# Patient Record
Sex: Male | Born: 1940
Health system: Southern US, Community
[De-identification: ages and names within clinical notes are randomized; demographics above are authoritative.]

## PROBLEM LIST (undated history)

## (undated) DIAGNOSIS — I10 Essential (primary) hypertension: Secondary | ICD-10-CM

## (undated) DIAGNOSIS — K219 Gastro-esophageal reflux disease without esophagitis: Secondary | ICD-10-CM

## (undated) DIAGNOSIS — N419 Inflammatory disease of prostate, unspecified: Secondary | ICD-10-CM

## (undated) HISTORY — PX: PACEMAKER INSERTION: SHX728

---

## 2011-06-24 ENCOUNTER — Encounter (HOSPITAL_BASED_OUTPATIENT_CLINIC_OR_DEPARTMENT_OTHER): Payer: Self-pay | Admitting: Emergency Medicine

## 2011-06-24 ENCOUNTER — Emergency Department (HOSPITAL_BASED_OUTPATIENT_CLINIC_OR_DEPARTMENT_OTHER)
Admission: EM | Admit: 2011-06-24 | Discharge: 2011-06-24 | Disposition: A | Discharge status: Home / Self Care | Payer: No Typology Code available for payment source | Attending: Emergency Medicine | Admitting: Emergency Medicine

## 2011-06-24 DIAGNOSIS — M109 Gout, unspecified: Secondary | ICD-10-CM | POA: Insufficient documentation

## 2011-06-24 DIAGNOSIS — I10 Essential (primary) hypertension: Secondary | ICD-10-CM | POA: Insufficient documentation

## 2011-06-24 DIAGNOSIS — Z79899 Other long term (current) drug therapy: Secondary | ICD-10-CM | POA: Insufficient documentation

## 2011-06-24 DIAGNOSIS — K219 Gastro-esophageal reflux disease without esophagitis: Secondary | ICD-10-CM | POA: Insufficient documentation

## 2011-06-24 DIAGNOSIS — E119 Type 2 diabetes mellitus without complications: Secondary | ICD-10-CM | POA: Insufficient documentation

## 2011-06-24 HISTORY — DX: Inflammatory disease of prostate, unspecified: N41.9

## 2011-06-24 HISTORY — DX: Gastro-esophageal reflux disease without esophagitis: K21.9

## 2011-06-24 HISTORY — DX: Essential (primary) hypertension: I10

## 2011-06-24 MED ORDER — INDOMETHACIN 25 MG PO CAPS: 25.0000 mg | ORAL_CAPSULE | Freq: Three times a day (TID) | ORAL | Status: AC

## 2011-06-24 NOTE — Discharge Instructions (Signed)
Purine Restricted Diet  A low-purine diet consists of foods that reduce uric acid made in your body.  INDICATIONS FOR USE    Your caregiver may ask you to follow a low-purine diet to reduce gout flairs.    GUIDELINES    Avoid high-purine foods, including all alcohol, yeast extracts taken as supplements, and sauces made from meats (like gravy). Do not eat high-purine meats, including anchovies, sardines, herring, mussels, tuna, codfish, scallops, trout, haddock, bacon, organ meats, tripe, goose, wild game, and sweetbreads.    Grains   Allowed/Recommended: All, except those listed to consume in moderation.    Consume in Moderation: Oatmeal (? cup uncooked daily), wheat bran or germ ( cup daily), and whole grains.   Vegetables   Allowed/Recommended: All, except those listed to consume in moderation.    Consume in Moderation: Asparagus, cauliflower, spinach, mushrooms, and green peas ( cup daily).   Fruit   Allowed/Recommended: All.    Consume in Moderation: None.   Meat and Meat Substitutes   Allowed/Recommended: Eggs, nuts, and peanut butter.    Consume in Moderation: Limit to 4 to 6 oz daily. Avoid high-purine meats. Lentils, peas, and dried beans (1 cup daily).   Milk   Allowed/Recommended: All. Choose low-fat or skim when possible.    Consume in Moderation: None.   Fats and Oils   Allowed/Recommended: All.    Consume in Moderation: None.   Beverages   Allowed/Recommended: All, except those listed to avoid.    Avoid: All alcohol.   Condiments/Miscellaneous   Allowed/Recommended: All, except those listed to consume in moderation.    Consume in Moderation: Bouillon and meat-based broths and soups.   Document Released: 05/28/2010 Document Revised: 01/20/2011 Document Reviewed: 05/28/2010  [employer] Patient Information 2012 [employer]    Gout   Gout is an inflammatory condition (arthritis) caused by a buildup of uric acid crystals in the joints. Uric acid is a chemical that is normally present in the blood. Under some circumstances, uric acid can form into crystals in your joints. This causes joint redness, soreness, and swelling (inflammation). Repeat attacks are common. Over time, uric acid crystals can form into masses (tophi) near a joint, causing disfigurement. Gout is treatable and often preventable.  CAUSES    The disease begins with elevated levels of uric acid in the blood. Uric acid is produced by your body when it breaks down a naturally found substance called purines. This also happens when you eat certain foods such as meats and fish. Causes of an elevated uric acid level include:   Being passed down from parent to child (heredity).    Diseases that cause increased uric acid production (obesity, psoriasis, some cancers).    Excessive alcohol use.    Diet, especially diets rich in meat and seafood.    Medicines, including certain cancer-fighting drugs (chemotherapy), diuretics, and aspirin.    Chronic kidney disease. The kidneys are no longer able to remove uric acid well.    Problems with metabolism.   Conditions strongly associated with gout include:   Obesity.    High blood pressure.    High cholesterol.    Diabetes.   Not everyone with elevated uric acid levels gets gout. It is not understood why some people get gout and others do not. Surgery, joint injury, and eating too much of certain foods are some of the factors that can lead to gout.  SYMPTOMS     An attack of gout comes on quickly. It causes   intense pain with redness, swelling, and warmth in a joint.    Fever can occur.    Often, only one joint is involved. Certain joints are more commonly involved:    Base of the big toe.    Knee.    Ankle.    Wrist.    Finger.    Without treatment, an attack usually goes away in a few days to weeks. Between attacks, you usually will not have symptoms, which is different from many other forms of arthritis.  DIAGNOSIS    Your caregiver will suspect gout based on your symptoms and exam. Removal of fluid from the joint (arthrocentesis) is done to check for uric acid crystals. Your caregiver will give you a medicine that numbs the area (local anesthetic) and use a needle to remove joint fluid for exam. Gout is confirmed when uric acid crystals are seen in joint fluid, using a special microscope. Sometimes, blood, urine, and X-ray tests are also used.  TREATMENT    There are 2 phases to gout treatment: treating the sudden onset (acute) attack and preventing attacks (prophylaxis).  Treatment of an Acute Attack   Medicines are used. These include anti-inflammatory medicines or steroid medicines.    An injection of steroid medicine into the affected joint is sometimes necessary.    The painful joint is rested. Movement can worsen the arthritis.    You may use warm or cold treatments on painful joints, depending which works best for you.    Discuss the use of coffee, vitamin C, or cherries with your caregiver. These may be helpful treatment options.   Treatment to Prevent Attacks  After the acute attack subsides, your caregiver may advise prophylactic medicine. These medicines either help your kidneys eliminate uric acid from your body or decrease your uric acid production. You may need to stay on these medicines for a very long time.  The early phase of treatment with prophylactic medicine can be associated with an increase in acute gout attacks. For this reason, during the first few months of treatment, your caregiver may also advise you to take medicines usually used for acute gout treatment. Be sure you understand your caregiver's directions.   You should also discuss dietary treatment with your caregiver. Certain foods such as meats and fish can increase uric acid levels. Other foods such as dairy can decrease levels. Your caregiver can give you a list of foods to avoid.  HOME CARE INSTRUCTIONS     Do not take aspirin to relieve pain. This raises uric acid levels.    Only take over-the-counter or prescription medicines for pain, discomfort, or fever as directed by your caregiver.    Rest the joint as much as possible. When in bed, keep sheets and blankets off painful areas.    Keep the affected joint raised (elevated).    Use crutches if the painful joint is in your leg.    Drink enough water and fluids to keep your urine clear or pale yellow. This helps your body get rid of uric acid. Do not drink alcoholic beverages. They slow the passage of uric acid.    Follow your caregiver's dietary instructions. Pay careful attention to the amount of protein you eat. Your daily diet should emphasize fruits, vegetables, whole grains, and fat-free or low-fat milk products.    Maintain a healthy body weight.   SEEK MEDICAL CARE IF:     You have an oral temperature above 102 F (38.9 C).    You   develop diarrhea, vomiting, or any side effects from medicines.    You do not feel better in 24 hours, or you are getting worse.   SEEK IMMEDIATE MEDICAL CARE IF:     Your joint becomes suddenly more tender and you have:    Chills.    An oral temperature above 102 F (38.9 C), not controlled by medicine.   MAKE SURE YOU:     Understand these instructions.    Will watch your condition.    Will get help right away if you are not doing well or get worse.   Document Released: 01/29/2000 Document Revised: 01/20/2011 Document Reviewed: 05/11/2009  [employer] Patient Information 2012 [employer].

## 2011-06-24 NOTE — ED Provider Notes (Signed)
History     CSN: [id number]  Arrival date & time 06/24/11  0227   First MD Initiated Contact with Patient 06/24/11 0245      Chief Complaint  Patient presents with  . Foot Pain    (Consider location/radiation/quality/duration/timing/severity/associated sxs/prior treatment) Patient is a 71 y.o. male presenting with lower extremity pain. The history is provided by the patient.  Foot Pain This is a recurrent problem. The current episode started yesterday. The problem occurs constantly. The problem has not changed since onset.Pertinent negatives include no chest pain, no abdominal pain, no headaches and no shortness of breath. The symptoms are aggravated by nothing. The symptoms are relieved by nothing. He has tried nothing for the symptoms. The treatment provided no relief.  H/o of gout with right great toe pain and swelling.  Was afraid to take his meds so he came in for a check.    Past Medical History  Diagnosis Date  . Gout   . GERD (gastroesophageal reflux disease)   . Diabetes mellitus   . Hypertension   . Prostatitis     Past Surgical History  Procedure Date  . Pacemaker insertion     No family history on file.  History  Substance Use Topics  . Smoking status: Never Smoker   . Smokeless tobacco: Not on file  . Alcohol Use: Yes      Review of Systems  Respiratory: Negative for shortness of breath.   Cardiovascular: Negative for chest pain.  Gastrointestinal: Negative for abdominal pain.  Neurological: Negative for headaches.  All other systems reviewed and are negative.    Allergies  Vicodin  Home Medications   Current Outpatient Rx  Name Route Sig Dispense Refill  . ASPIRIN 81 MG PO TABS Oral Take 81 mg by mouth daily.    Marland Kitchen DILTIAZEM HCL ER 240 MG PO CP24 Oral Take 240 mg by mouth daily.    . GUAIFENESIN ER 600 MG PO TB12 Oral Take 1,200 mg by mouth 2 (two) times daily.    . INDOMETHACIN 50 MG PO CAPS Oral Take 50 mg by mouth 3 (three) times daily  with meals.    Marland Kitchen LEVOFLOXACIN 500 MG PO TABS Oral Take 500 mg by mouth daily.    Marland Kitchen LISINOPRIL-HYDROCHLOROTHIAZIDE 20-25 MG PO TABS Oral Take 1 tablet by mouth daily.    Marland Kitchen OMEPRAZOLE 20 MG PO CPDR Oral Take 20 mg by mouth daily.    Marland Kitchen SIMVASTATIN 20 MG PO TABS Oral Take 20 mg by mouth every evening.    . INDOMETHACIN 25 MG PO CAPS Oral Take 1 capsule (25 mg total) by mouth 3 (three) times daily with meals. 20 capsule 0    BP 123/77  Pulse 95  Temp(Src) 97.8 F (36.6 C) (Oral)  Resp 18  Ht 5\' 8"  (1.727 m)  Wt 262 lb (118.842 kg)  BMI 39.84 kg/m2  SpO2 100%  Physical Exam  Constitutional: He is oriented to person, place, and time. He appears well-developed and well-nourished. No distress.  HENT:  Head: Normocephalic and atraumatic.  Eyes: Conjunctivae are normal. Pupils are equal, round, and reactive to light.  Neck: Normal range of motion. Neck supple.  Cardiovascular: Normal rate and regular rhythm.   Pulmonary/Chest: Effort normal and breath sounds normal.  Abdominal: Soft. Bowel sounds are normal.  Musculoskeletal: Normal range of motion.       Swelling of right great toe without erythema, cw gout  Neurological: He is alert and oriented to person, place, and  time.  Skin: Skin is warm and dry.  Psychiatric: He has a normal mood and affect.    ED Course  Procedures (including critical care time)  Labs Reviewed - No data to display No results found.   1. Gout       MDM  Take indomethacin with food and follow up with your family doctor      Shene Maxfield K Starnisha Batrez-Rasch, MD 06/24/11 [phone]

## 2011-06-24 NOTE — ED Notes (Signed)
Pt c/o pain in right foot starting last night. Pt has hx of gout, denies known injury.

## 2011-06-24 NOTE — ED Notes (Signed)
MD at bedside. 

## 2012-09-18 ENCOUNTER — Emergency Department (HOSPITAL_BASED_OUTPATIENT_CLINIC_OR_DEPARTMENT_OTHER): Payer: Medicare HMO

## 2012-09-18 ENCOUNTER — Encounter (HOSPITAL_BASED_OUTPATIENT_CLINIC_OR_DEPARTMENT_OTHER): Payer: Self-pay | Admitting: Emergency Medicine

## 2012-09-18 ENCOUNTER — Emergency Department (HOSPITAL_BASED_OUTPATIENT_CLINIC_OR_DEPARTMENT_OTHER)
Admission: EM | Admit: 2012-09-18 | Discharge: 2012-09-18 | Disposition: A | Discharge status: Home / Self Care | Payer: Medicare HMO | Attending: Emergency Medicine | Admitting: Emergency Medicine

## 2012-09-18 ENCOUNTER — Other Ambulatory Visit: Payer: Self-pay

## 2012-09-18 DIAGNOSIS — R059 Cough, unspecified: Secondary | ICD-10-CM | POA: Insufficient documentation

## 2012-09-18 DIAGNOSIS — F458 Other somatoform disorders: Secondary | ICD-10-CM | POA: Insufficient documentation

## 2012-09-18 DIAGNOSIS — E119 Type 2 diabetes mellitus without complications: Secondary | ICD-10-CM | POA: Insufficient documentation

## 2012-09-18 DIAGNOSIS — R05 Cough: Secondary | ICD-10-CM

## 2012-09-18 DIAGNOSIS — R0989 Other specified symptoms and signs involving the circulatory and respiratory systems: Secondary | ICD-10-CM

## 2012-09-18 DIAGNOSIS — R141 Gas pain: Secondary | ICD-10-CM | POA: Insufficient documentation

## 2012-09-18 DIAGNOSIS — M109 Gout, unspecified: Secondary | ICD-10-CM | POA: Insufficient documentation

## 2012-09-18 DIAGNOSIS — R220 Localized swelling, mass and lump, head: Secondary | ICD-10-CM | POA: Insufficient documentation

## 2012-09-18 DIAGNOSIS — R143 Flatulence: Secondary | ICD-10-CM | POA: Insufficient documentation

## 2012-09-18 DIAGNOSIS — Z79899 Other long term (current) drug therapy: Secondary | ICD-10-CM | POA: Insufficient documentation

## 2012-09-18 DIAGNOSIS — Z7982 Long term (current) use of aspirin: Secondary | ICD-10-CM | POA: Insufficient documentation

## 2012-09-18 DIAGNOSIS — K219 Gastro-esophageal reflux disease without esophagitis: Secondary | ICD-10-CM | POA: Insufficient documentation

## 2012-09-18 DIAGNOSIS — N419 Inflammatory disease of prostate, unspecified: Secondary | ICD-10-CM | POA: Insufficient documentation

## 2012-09-18 DIAGNOSIS — Z791 Long term (current) use of non-steroidal anti-inflammatories (NSAID): Secondary | ICD-10-CM | POA: Insufficient documentation

## 2012-09-18 DIAGNOSIS — Z792 Long term (current) use of antibiotics: Secondary | ICD-10-CM | POA: Insufficient documentation

## 2012-09-18 DIAGNOSIS — R142 Eructation: Secondary | ICD-10-CM | POA: Insufficient documentation

## 2012-09-18 DIAGNOSIS — J3489 Other specified disorders of nose and nasal sinuses: Secondary | ICD-10-CM | POA: Insufficient documentation

## 2012-09-18 DIAGNOSIS — I10 Essential (primary) hypertension: Secondary | ICD-10-CM | POA: Insufficient documentation

## 2012-09-18 LAB — CBC WITH DIFFERENTIAL/PLATELET
Basophils Absolute: 0 10*3/uL (ref 0.0–0.1)
Basophils Relative: 0 % (ref 0–1)
MCHC: 33.9 g/dL (ref 30.0–36.0)
Neutro Abs: 5.1 10*3/uL (ref 1.7–7.7)
Neutrophils Relative %: 55 % (ref 43–77)
RDW: 12.5 % (ref 11.5–15.5)

## 2012-09-18 LAB — BASIC METABOLIC PANEL
GFR calc Af Amer: 62 mL/min — ABNORMAL LOW (ref >90)
GFR calc non Af Amer: 54 mL/min — ABNORMAL LOW (ref >90)
Potassium: 3.8 mEq/L (ref 3.5–5.1)
Sodium: 138 mEq/L (ref 135–145)

## 2012-09-18 MED ORDER — BENZONATATE 100 MG PO CAPS: 200.0000 mg | ORAL_CAPSULE | Freq: Three times a day (TID) | ORAL | Status: DC | PRN

## 2012-09-18 MED ORDER — OMEPRAZOLE 20 MG PO CPDR: 20.0000 mg | DELAYED_RELEASE_CAPSULE | Freq: Two times a day (BID) | ORAL | Status: DC

## 2012-09-18 MED ORDER — GI COCKTAIL ~~LOC~~
30.0000 mL | Freq: Once | ORAL | Status: AC
  Administered 2012-09-18: 30 mL via ORAL
  Filled 2012-09-18: qty 30

## 2012-09-18 MED ORDER — OXYMETAZOLINE HCL 0.05 % NA SOLN
1.0000 | Freq: Once | NASAL | Status: AC
  Administered 2012-09-18: 1 via NASAL
  Filled 2012-09-18: qty 15

## 2012-09-18 NOTE — ED Notes (Signed)
Pt reports cough producing white sputum x several days. Pt states tonight feels like his throat is closing up and difficulty to swallow. Pt in NAD

## 2012-09-18 NOTE — ED Notes (Signed)
MD at bedside. 

## 2012-09-18 NOTE — ED Provider Notes (Signed)
CSN: [id number]     Arrival date & time 09/18/12  0221 History     First MD Initiated Contact with Patient 09/18/12 0248     Chief Complaint  Patient presents with  . Cough  . Oral Swelling   HPI Derek Curry is a 72 y.o. male presenting to the emergency department with a cough productive of small amounts of white sputum, he also has a sensation of fullness or indigestion and that he constantly has to belch, these symptoms have been going on for 3 days. He also complains of increased nasal drainage. Denies any nasal congestion, ear pain or sinus pain, denies fevers, chills, chest pain, shortness of breath, hemoptysis, history of venous thromboembolic disease, recent immobilization, recent fracture, history of cancer treatments. Patient is a pertinent history of reflux disease, hypertension and diabetes.   Past Medical History  Diagnosis Date  . Gout   . GERD (gastroesophageal reflux disease)   . Diabetes mellitus   . Hypertension   . Prostatitis    Past Surgical History  Procedure Laterality Date  . Pacemaker insertion     No family history on file. History  Substance Use Topics  . Smoking status: Never Smoker   . Smokeless tobacco: Not on file  . Alcohol Use: Yes    Review of Systems At least 10pt or greater review of systems completed and are negative except where specified in the HPI.  Allergies  Vicodin  Home Medications   Current Outpatient Rx  Name  Route  Sig  Dispense  Refill  . celecoxib (CELEBREX) 50 MG capsule   Oral   Take 50 mg by mouth 2 (two) times daily.         Marland Kitchen diltiazem (DILACOR XR) 240 MG 24 hr capsule   Oral   Take 240 mg by mouth daily.         Marland Kitchen guaiFENesin (MUCINEX) 600 MG 12 hr tablet   Oral   Take 1,200 mg by mouth 2 (two) times daily.         . lansoprazole (PREVACID) 30 MG capsule   Oral   Take 30 mg by mouth daily.         Marland Kitchen lisinopril-hydrochlorothiazide (PRINZIDE,ZESTORETIC) 20-25 MG per tablet   Oral   Take 1  tablet by mouth daily.         . simvastatin (ZOCOR) 20 MG tablet   Oral   Take 20 mg by mouth every evening.         Marland Kitchen aspirin 81 MG tablet   Oral   Take 81 mg by mouth daily.         . benzonatate (TESSALON) 100 MG capsule   Oral   Take 2 capsules (200 mg total) by mouth 3 (three) times daily as needed for cough.   21 capsule   0   . indomethacin (INDOCIN) 50 MG capsule   Oral   Take 50 mg by mouth 3 (three) times daily with meals.         Marland Kitchen levofloxacin (LEVAQUIN) 500 MG tablet   Oral   Take 500 mg by mouth daily.         Marland Kitchen omeprazole (PRILOSEC) 20 MG capsule   Oral   Take 1 capsule (20 mg total) by mouth 2 (two) times daily.   30 capsule   0    BP 120/81  Pulse 60  Temp(Src) 97.7 F (36.5 C) (Oral)  Resp 18  SpO2 100% Physical Exam  Nursing notes reviewed.  Electronic medical record reviewed. VITAL SIGNS:   Filed Vitals:   09/18/12 0229 09/18/12 0440  BP: 129/75 120/81  Pulse: 78 60  Temp: 97.7 F (36.5 C)   TempSrc: Oral   Resp: 18 18  SpO2: 99% 100%   CONSTITUTIONAL: Awake, oriented, appears non-toxic HENT: Atraumatic, normocephalic, oral mucosa pink and moist, airway patent. Mild pharyngeal erythema without exudates or swelling. Nares patent without drainage. External ears normal. EYES: Conjunctiva clear, EOMI, PERRLA NECK: Trachea midline, non-tender, supple CARDIOVASCULAR: Normal heart rate, Normal rhythm, No murmurs, rubs, gallops PULMONARY/CHEST: Clear to auscultation, no rhonchi, wheezes, or rales. Symmetrical breath sounds. Non-tender. Pacemaker ABDOMINAL: Non-distended, obese, soft, non-tender - no rebound or guarding.  BS normal. NEUROLOGIC: Non-focal, moving all four extremities, no gross sensory or motor deficits. EXTREMITIES: No clubbing, cyanosis, or edema SKIN: Warm, Dry, No erythema, No rash  ED Course   Procedures (including critical care time)  Date: 09/18/2012  Rate: 79  Rhythm: sinus rhythm with PACs  QRS Axis:  Left  Intervals: normal  ST/T Wave abnormalities: normal  Conduction Disutrbances: none  Narrative Interpretation: Left axis deviation, nonspecific intraventricular block, sinus rhythm with PACs, no ST or T wave abnormalities suggestive of acute ischemia or infarction, no prior  Labs Reviewed  BASIC METABOLIC PANEL - Abnormal; Notable for the following:    Glucose, Bld 157 (*)    GFR calc non Af Amer 54 (*)    GFR calc Af Amer 62 (*)    All other components within normal limits  CBC WITH DIFFERENTIAL  TROPONIN I   Dg Chest 2 View  09/18/2012   *RADIOLOGY REPORT*  Clinical Data: Cough and congestion.  CHEST - 2 VIEW  Comparison: None.  Findings: Lungs are clear.  Heart size is normal.  No pneumothorax or pleural effusion.  IMPRESSION: No acute disease.   Original Report Authenticated By: Holley Dexter, M.D.   1. Cough   2. Globus sensation   3. Belching   4. Nasal drainage     MDM  Derek Curry is a 72 y.o. male presenting with globus sensation, cough with some sputum, overall presentation is consistent with GERD exacerbation, however with globus indigestion, history of hypertension, age, history of diabetes, wanted to ensure this is not an anginal equivalent presenting atypically in a diabetic older male, EKG is unremarkable for ST changes, nonischemic. Troponin is negative, versus patient's workup is unremarkable, vital signs are stable, within normal limits, is afebrile, nontoxic. Patient is feeling better after GI cocktail as well as Tessalon. Will increase patient's Prilosec concern for worsening GERD, have patient followup in one week with primary care physician. Patient to return to the ER for worsening symptoms, any chest pain, shortness of breath.  Patient is discharged home stable and in good condition, ventilator the patient's questions to his and his wife's satisfaction.   Jones Skene, MD 09/18/12 [phone]

## 2012-09-18 NOTE — ED Notes (Signed)
Patient transported to X-ray 

## 2013-05-14 ENCOUNTER — Encounter (HOSPITAL_BASED_OUTPATIENT_CLINIC_OR_DEPARTMENT_OTHER): Payer: Self-pay | Admitting: Emergency Medicine

## 2013-05-14 ENCOUNTER — Emergency Department (HOSPITAL_BASED_OUTPATIENT_CLINIC_OR_DEPARTMENT_OTHER)
Admission: EM | Admit: 2013-05-14 | Discharge: 2013-05-15 | Disposition: A | Discharge status: Home / Self Care | Payer: Medicare HMO | Attending: Emergency Medicine | Admitting: Emergency Medicine

## 2013-05-14 DIAGNOSIS — Z7982 Long term (current) use of aspirin: Secondary | ICD-10-CM | POA: Insufficient documentation

## 2013-05-14 DIAGNOSIS — N289 Disorder of kidney and ureter, unspecified: Secondary | ICD-10-CM | POA: Insufficient documentation

## 2013-05-14 DIAGNOSIS — R0602 Shortness of breath: Secondary | ICD-10-CM | POA: Insufficient documentation

## 2013-05-14 DIAGNOSIS — H18419 Arcus senilis, unspecified eye: Secondary | ICD-10-CM | POA: Insufficient documentation

## 2013-05-14 DIAGNOSIS — J029 Acute pharyngitis, unspecified: Secondary | ICD-10-CM | POA: Insufficient documentation

## 2013-05-14 DIAGNOSIS — M109 Gout, unspecified: Secondary | ICD-10-CM | POA: Insufficient documentation

## 2013-05-14 DIAGNOSIS — Z79899 Other long term (current) drug therapy: Secondary | ICD-10-CM | POA: Insufficient documentation

## 2013-05-14 DIAGNOSIS — E119 Type 2 diabetes mellitus without complications: Secondary | ICD-10-CM | POA: Insufficient documentation

## 2013-05-14 DIAGNOSIS — K219 Gastro-esophageal reflux disease without esophagitis: Secondary | ICD-10-CM | POA: Insufficient documentation

## 2013-05-14 DIAGNOSIS — R0982 Postnasal drip: Secondary | ICD-10-CM

## 2013-05-14 DIAGNOSIS — Z87448 Personal history of other diseases of urinary system: Secondary | ICD-10-CM | POA: Insufficient documentation

## 2013-05-14 DIAGNOSIS — I10 Essential (primary) hypertension: Secondary | ICD-10-CM | POA: Insufficient documentation

## 2013-05-14 NOTE — ED Notes (Signed)
Pt. Reports he had been "spitting up heavy mucus" white in color.  Pt. Also reports belching.  No reports of abd. Pain or diarrhea.

## 2013-05-14 NOTE — ED Provider Notes (Addendum)
CSN: [id number]     Arrival date & time 05/14/13  2329 History  This chart was scribed for Derek Fines, MD by Zettie Pho, ED Scribe. This patient was seen in room MH08/MH08 and the patient's care was started at 11:56 PM.    Chief Complaint  Patient presents with  . Cough   The history is provided by the patient. No language interpreter was used.   HPI Comments: Derek Curry is a 73 y.o. male with a history of GERD who presents to the Emergency Department complaining of a productive cough with white-colored sputum with associated postnasal drip, rhinorrhea, and sore throat onset last night. He reports some associated, intermittent shortness of breath. Patient was recently admitted to and discharged from the hospital for cellulitis to the right, lower leg and is currently taking Bactrim. He denies nausea, diarrhea, chest pain. Patient also has a history of DM, HTN, and prostatitis.   Past Medical History  Diagnosis Date  . Gout   . GERD (gastroesophageal reflux disease)   . Diabetes mellitus   . Hypertension   . Prostatitis    Past Surgical History  Procedure Laterality Date  . Pacemaker insertion     No family history on file. History  Substance Use Topics  . Smoking status: Never Smoker   . Smokeless tobacco: Not on file  . Alcohol Use: No    Review of Systems  A complete 10 system review of systems was obtained and all systems are negative except as noted in the HPI and PMH.    Allergies  Vicodin  Home Medications   Current Outpatient Rx  Name  Route  Sig  Dispense  Refill  . aspirin 81 MG tablet   Oral   Take 81 mg by mouth daily.         . benzonatate (TESSALON) 100 MG capsule   Oral   Take 2 capsules (200 mg total) by mouth 3 (three) times daily as needed for cough.   21 capsule   0   . celecoxib (CELEBREX) 50 MG capsule   Oral   Take 50 mg by mouth 2 (two) times daily.         Marland Kitchen diltiazem (DILACOR XR) 240 MG 24 hr capsule   Oral   Take 240 mg  by mouth daily.         Marland Kitchen guaiFENesin (MUCINEX) 600 MG 12 hr tablet   Oral   Take 1,200 mg by mouth 2 (two) times daily.         . indomethacin (INDOCIN) 50 MG capsule   Oral   Take 50 mg by mouth 3 (three) times daily with meals.         . lansoprazole (PREVACID) 30 MG capsule   Oral   Take 30 mg by mouth daily.         Marland Kitchen levofloxacin (LEVAQUIN) 500 MG tablet   Oral   Take 500 mg by mouth daily.         Marland Kitchen lisinopril-hydrochlorothiazide (PRINZIDE,ZESTORETIC) 20-25 MG per tablet   Oral   Take 1 tablet by mouth daily.         Marland Kitchen omeprazole (PRILOSEC) 20 MG capsule   Oral   Take 1 capsule (20 mg total) by mouth 2 (two) times daily.   30 capsule   0   . simvastatin (ZOCOR) 20 MG tablet   Oral   Take 20 mg by mouth every evening.  Triage Vitals: BP 132/79  Pulse 92  Temp(Src) 98.3 F (36.8 C) (Oral)  Resp 20  SpO2 100%  Physical Exam  Nursing note and vitals reviewed. Constitutional: He is oriented to person, place, and time. He appears well-developed and well-nourished. No distress.  HENT:  Head: Normocephalic and atraumatic.  Mouth/Throat: Oropharynx is clear and moist. No oropharyngeal exudate.  Eyes: Conjunctivae and EOM are normal. Pupils are equal, round, and reactive to light.  Arcus senilis bilaterally.   Neck: Normal range of motion. Neck supple.  Cardiovascular: Normal rate, regular rhythm, normal heart sounds and intact distal pulses.   Pulses:      Dorsalis pedis pulses are 2+ on the right side, and 2+ on the left side.  Pulmonary/Chest: Effort normal and breath sounds normal. No respiratory distress.  Abdominal: Soft. He exhibits no distension. There is no tenderness.  Hyperactive bowel sounds.   Musculoskeletal: Normal range of motion. He exhibits edema.  Chronic appearing edema of the lower legs.   Neurological: He is alert and oriented to person, place, and time.  Skin: Skin is warm and dry.  Psychiatric: He has a normal mood  and affect. His behavior is normal.    ED Course  Procedures (including critical care time)  DIAGNOSTIC STUDIES: Oxygen Saturation is 100% on room air, normal by my interpretation.    COORDINATION OF CARE: 12:01 AM- Discussed treatment plan with patient at bedside and patient verbalized agreement.   12:05 AM It is unclear from the patient's description if he is coughing up frothy mucus or if he is belching or vomiting upper frothy mucus. He does not describe frank emesis.  MDM   Nursing notes and vitals signs, including pulse oximetry, reviewed.  Summary of this visit's results, reviewed by myself:  Labs:  Results for orders placed during the hospital encounter of 05/14/13 (from the past 24 hour(s))  CBC WITH DIFFERENTIAL     Status: Abnormal   Collection Time    05/15/13 12:10 AM      Result Value Ref Range   WBC 10.3  4.0 - 10.5 K/uL   RBC 3.65 (*) 4.22 - 5.81 MIL/uL   Hemoglobin 11.7 (*) 13.0 - 17.0 g/dL   HCT 34.4 (*) 39.0 - 52.0 %   MCV 94.2  78.0 - 100.0 fL   MCH 32.1  26.0 - 34.0 pg   MCHC 34.0  30.0 - 36.0 g/dL   RDW 12.9  11.5 - 15.5 %   Platelets 410 (*) 150 - 400 K/uL   Neutrophils Relative % 67  43 - 77 %   Neutro Abs 6.8  1.7 - 7.7 K/uL   Lymphocytes Relative 25  12 - 46 %   Lymphs Abs 2.5  0.7 - 4.0 K/uL   Monocytes Relative 8  3 - 12 %   Monocytes Absolute 0.8  0.1 - 1.0 K/uL   Eosinophils Relative 0  0 - 5 %   Eosinophils Absolute 0.0  0.0 - 0.7 K/uL   Basophils Relative 0  0 - 1 %   Basophils Absolute 0.0  0.0 - 0.1 K/uL  BASIC METABOLIC PANEL     Status: Abnormal   Collection Time    05/15/13 12:10 AM      Result Value Ref Range   Sodium 134 (*) 137 - 147 mEq/L   Potassium 4.6  3.7 - 5.3 mEq/L   Chloride 93 (*) 96 - 112 mEq/L   CO2 23  19 - 32 mEq/L  Glucose, Bld 124 (*) 70 - 99 mg/dL   BUN 32 (*) 6 - 23 mg/dL   Creatinine, Ser 2.90 (*) 0.50 - 1.35 mg/dL   Calcium 11.0 (*) 8.4 - 10.5 mg/dL   GFR calc non Af Amer 20 (*) >90 mL/min   GFR  calc Af Amer 24 (*) >90 mL/min  PRO B NATRIURETIC PEPTIDE     Status: None   Collection Time    05/15/13 12:10 AM      Result Value Ref Range   Pro B Natriuretic peptide (BNP) 100.4  0 - 125 pg/mL  TROPONIN I     Status: None   Collection Time    05/15/13 12:10 AM      Result Value Ref Range   Troponin I <0.30  <0.30 ng/mL    Imaging Studies: Dg Chest 2 View  05/15/2013   CLINICAL DATA:  Cough  EXAM: CHEST  2 VIEW  COMPARISON:  09/18/2012  FINDINGS: Unchanged orientation of dual-chamber left approach pacer leads. No cardiomegaly. Unchanged mediastinal contours. No edema, consolidation, effusion, or pneumothorax.  IMPRESSION: No active cardiopulmonary disease.   Electronically Signed   By: Jorje Guild M.D.   On: 05/15/2013 00:58   3:02 AM The patient's cellulitis appears to completely resolved. The patient's BUN and creatinine were noted to be 20 and 1.3 on 09/18/2012. Records from [facility] ED on March 17 of this year indicate his BUN and creatinine were 21 and 1.82. This morning his BUN and creatinine are 32 and 2.9. This represents a fairly abrupt onset of renal insufficiency. This is a known consequence of Bactrim therapy. We will discontinue his Bactrim. This was discussed with his primary care physician, Dr. Jefm Petty, who would like to see him in his office in 48 hours.  The patient's cough appears to be triggered by postnasal drip and we will provide oxymetazoline nasal spray for symptomatic relief.   I personally performed the services described in this documentation, which was scribed in my presence. The recorded information has been reviewed and is accurate.     Derek Fines, MD 05/15/13 0304  Derek Fines, MD 05/15/13 [phone]

## 2013-05-15 ENCOUNTER — Emergency Department (HOSPITAL_BASED_OUTPATIENT_CLINIC_OR_DEPARTMENT_OTHER): Payer: Medicare HMO

## 2013-05-15 LAB — BASIC METABOLIC PANEL
BUN: 32 mg/dL — ABNORMAL HIGH (ref 6–23)
CALCIUM: 11 mg/dL — AB (ref 8.4–10.5)
CO2: 23 meq/L (ref 19–32)
CREATININE: 2.9 mg/dL — AB (ref 0.50–1.35)
Chloride: 93 mEq/L — ABNORMAL LOW (ref 96–112)
GFR calc Af Amer: 24 mL/min — ABNORMAL LOW (ref >90)
GFR, EST NON AFRICAN AMERICAN: 20 mL/min — AB (ref >90)
GLUCOSE: 124 mg/dL — AB (ref 70–99)
Potassium: 4.6 mEq/L (ref 3.7–5.3)
Sodium: 134 mEq/L — ABNORMAL LOW (ref 137–147)

## 2013-05-15 LAB — CBC WITH DIFFERENTIAL/PLATELET
BASOS ABS: 0 10*3/uL (ref 0.0–0.1)
Basophils Relative: 0 % (ref 0–1)
EOS ABS: 0 10*3/uL (ref 0.0–0.7)
EOS PCT: 0 % (ref 0–5)
HEMATOCRIT: 34.4 % — AB (ref 39.0–52.0)
Hemoglobin: 11.7 g/dL — ABNORMAL LOW (ref 13.0–17.0)
LYMPHS ABS: 2.5 10*3/uL (ref 0.7–4.0)
LYMPHS PCT: 25 % (ref 12–46)
MCH: 32.1 pg (ref 26.0–34.0)
MCHC: 34 g/dL (ref 30.0–36.0)
MCV: 94.2 fL (ref 78.0–100.0)
MONO ABS: 0.8 10*3/uL (ref 0.1–1.0)
Monocytes Relative: 8 % (ref 3–12)
Neutro Abs: 6.8 10*3/uL (ref 1.7–7.7)
Neutrophils Relative %: 67 % (ref 43–77)
PLATELETS: 410 10*3/uL — AB (ref 150–400)
RBC: 3.65 MIL/uL — ABNORMAL LOW (ref 4.22–5.81)
RDW: 12.9 % (ref 11.5–15.5)
WBC: 10.3 10*3/uL (ref 4.0–10.5)

## 2013-05-15 LAB — PRO B NATRIURETIC PEPTIDE: Pro B Natriuretic peptide (BNP): 100.4 pg/mL (ref 0–125)

## 2013-05-15 LAB — TROPONIN I: Troponin I: <0.3 ng/mL (ref <0.30)

## 2013-05-15 MED ORDER — OXYMETAZOLINE HCL 0.05 % NA SOLN
NASAL | Status: AC
  Administered 2013-05-15: 03:00:00
  Filled 2013-05-15: qty 15

## 2013-05-15 MED ORDER — FAMOTIDINE IN NACL 20-0.9 MG/50ML-% IV SOLN
20.0000 mg | Freq: Once | INTRAVENOUS | Status: AC
  Administered 2013-05-15: 20 mg via INTRAVENOUS
  Filled 2013-05-15: qty 50

## 2013-05-15 MED ORDER — SUCRALFATE 1 G PO TABS
1.0000 g | ORAL_TABLET | Freq: Once | ORAL | Status: AC
  Administered 2013-05-15: 1 g via ORAL
  Filled 2013-05-15: qty 1

## 2013-05-15 NOTE — Discharge Instructions (Signed)
You need to stop taking the Bactrim (trimethoprim/sulfamethoxazole) immediately as it may be damaging her kidneys. Dr. Jeralene Huff wants to see you in his office on Friday. Call his office this morning and ask for [facility].

## 2014-03-12 DIAGNOSIS — G4733 Obstructive sleep apnea (adult) (pediatric): Secondary | ICD-10-CM | POA: Insufficient documentation

## 2014-03-12 DIAGNOSIS — E785 Hyperlipidemia, unspecified: Secondary | ICD-10-CM | POA: Insufficient documentation

## 2014-05-14 DIAGNOSIS — Z96652 Presence of left artificial knee joint: Secondary | ICD-10-CM | POA: Insufficient documentation

## 2015-03-10 DIAGNOSIS — I480 Paroxysmal atrial fibrillation: Secondary | ICD-10-CM | POA: Insufficient documentation

## 2015-03-10 DIAGNOSIS — K219 Gastro-esophageal reflux disease without esophagitis: Secondary | ICD-10-CM | POA: Insufficient documentation

## 2015-03-10 DIAGNOSIS — I10 Essential (primary) hypertension: Secondary | ICD-10-CM | POA: Insufficient documentation

## 2015-04-02 DIAGNOSIS — E119 Type 2 diabetes mellitus without complications: Secondary | ICD-10-CM | POA: Insufficient documentation

## 2015-04-22 DIAGNOSIS — Z8679 Personal history of other diseases of the circulatory system: Secondary | ICD-10-CM | POA: Insufficient documentation

## 2015-05-04 DIAGNOSIS — Z45018 Encounter for adjustment and management of other part of cardiac pacemaker: Secondary | ICD-10-CM | POA: Insufficient documentation

## 2015-05-04 DIAGNOSIS — I495 Sick sinus syndrome: Secondary | ICD-10-CM | POA: Insufficient documentation

## 2016-01-04 DIAGNOSIS — J302 Other seasonal allergic rhinitis: Secondary | ICD-10-CM | POA: Insufficient documentation

## 2016-01-04 DIAGNOSIS — M1A0720 Idiopathic chronic gout, left ankle and foot, without tophus (tophi): Secondary | ICD-10-CM | POA: Insufficient documentation

## 2018-02-27 DIAGNOSIS — M769 Unspecified enthesopathy, lower limb, excluding foot: Secondary | ICD-10-CM | POA: Diagnosis not present

## 2018-02-27 DIAGNOSIS — M25462 Effusion, left knee: Secondary | ICD-10-CM | POA: Diagnosis not present

## 2018-02-27 DIAGNOSIS — Z96652 Presence of left artificial knee joint: Secondary | ICD-10-CM | POA: Diagnosis not present

## 2018-02-27 DIAGNOSIS — Z885 Allergy status to narcotic agent status: Secondary | ICD-10-CM | POA: Diagnosis not present

## 2018-02-27 DIAGNOSIS — M61562 Other ossification of muscle, left lower leg: Secondary | ICD-10-CM | POA: Diagnosis not present

## 2018-02-27 DIAGNOSIS — Z471 Aftercare following joint replacement surgery: Secondary | ICD-10-CM | POA: Diagnosis not present

## 2018-04-11 ENCOUNTER — Other Ambulatory Visit: Payer: Self-pay

## 2018-04-11 NOTE — Patient Outreach (Addendum)
  [place] [facility]) Care Management Chronic Special Needs Program   04/11/2018  Name: Jorey Dollard, DOB: [birth year]  MRN: [id number]  The client was discussed in 03/30/18'2 interdisciplinary care team meeting. The client's individualized care plan was developed based on completed Health Risk Assessment The following issues were discussed:  Key risk triggers/risk stratification and Care Plan  Participants present:   Mahlon Gammon, MSN, RN, CCM, CNS    Thea Silversmith, MSN, RN, CCM   Cordelia Bessinger RN,BSN,CCM, CDE       Recommendations:  Refer to pharmacy for > 8 medications,  Send Advanced Directives packet, provide education on HTN, A.Fib, weight loss  Plan:  Plan to send copy of individualized care plan to client Plan to send individualized care plan to provider. Chronic Care Management Coordinator will outreach in 1-2 months  Follow-up:  1-2 months  Peter Garter RN, [name], CDE Chronic Care Management Coordinator [place] [facility] [phone]

## 2018-04-16 ENCOUNTER — Telehealth: Payer: Self-pay | Admitting: Pharmacist

## 2018-04-16 NOTE — Patient Outreach (Addendum)
[facility] [facility]) Care Management  04/16/2018  Arnet Hofferber 06/23/1940 [id number]   Patient was called regarding medication assistance and management. He is part of the [employer]. Unfortunately, the phone number listed in his chart was disconnected and a message could not be left.  Called HTA, patient has FULL LIS.  Plan: Send unsuccessful outreach letter Call patient back in 5-7 business days.   Elayne Guerin, PharmD, [place] Clinical Pharmacist [phone]

## 2018-04-25 ENCOUNTER — Other Ambulatory Visit: Payer: Self-pay | Admitting: Pharmacist

## 2018-04-25 ENCOUNTER — Ambulatory Visit: Payer: Self-pay | Admitting: Pharmacist

## 2018-04-25 NOTE — Patient Outreach (Signed)
[place] [facility]) Care Management  04/25/2018  Kanden Carey 06/25/1940 [id number]    Patient was called regarding medication assistance and management. He is part of the [employer]. Unfortunately, the phone number listed in his chart was disconnected and a message could not be left. Today's call was call #2.  Called HTA, patient has FULL LIS.  Plan: Sent unsuccessful outreach letter on 04/16/2018. Call patient back in 10-14 days.  Elayne Guerin, PharmD, [place] Clinical Pharmacist [phone]

## 2018-05-07 DIAGNOSIS — Z950 Presence of cardiac pacemaker: Secondary | ICD-10-CM | POA: Diagnosis not present

## 2018-05-08 ENCOUNTER — Other Ambulatory Visit: Payer: Self-pay | Admitting: Pharmacist

## 2018-05-09 ENCOUNTER — Ambulatory Visit: Payer: Self-pay | Admitting: Pharmacist

## 2018-05-11 ENCOUNTER — Other Ambulatory Visit: Payer: Self-pay

## 2018-05-11 NOTE — Patient Outreach (Signed)
  [place] [facility]) Care Management Chronic Special Needs Program  05/11/2018  Name: Derek Curry DOB: 10-19-1940  MRN: [id number]  [name] is enrolled in a chronic special needs plan for Diabetes. Chronic Care Management Coordinator telephoned client to review health risk assessment and to develop individualized care plan.  Introduced the chronic care management program, importance of client participation, and taking their care plan to all provider appointments and inpatient facilities.  Reviewed the transition of care process and possible referral to community care management. HIPAA verified name, date of birth, address and phone number Subjective:Client states that he does not take any medications for his diabetes.  States that he checks his blood sugars about every other day and they range 110-148.  States his blood pressure is also in control.  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    Goals Addressed            This Visit's Progress   .  Acknowledge receipt of Programme researcher, broadcasting/film/video      .  Diabetes Patient stated goal to stay off of medication for diabetes for the next 9 months (pt-stated)      . Advanced Care Planning complete by next 9 months      . Client understands the importance of follow-up with providers by attending scheduled visits      . Client will use Assistive Devices as needed and verbalize understanding of device use      . Client will verbalize knowledge of self management of Hypertension as evidences by BP reading of 140/90 or less; or as defined by provider      . HEMOGLOBIN A1C < 7.0       Diabetes self management actions:  Glucose monitoring per provider recommendations  Perform Quality checks on blood meter  Eat Healthy  Check feet daily  Visit provider every 3-6 months as directed  Hbg A1C level every 3-6 months.  Eye Exam yearly    . Maintain timely refills of  diabetic medication as prescribed within the year .      Marland Kitchen Obtain annual  Lipid Profile, LDL-C      . Obtain Annual Eye (retinal)  Exam       . Obtain Annual Foot Exam      . Obtain annual screen for micro albuminuria (urine) , nephropathy (kidney problems)      . Obtain Hemoglobin A1C at least 2 times per year      . Visit Primary Care Provider or Endocrinologist at least 2 times per year        Client currently does not take any medications for diabetes. Reports up to date on eye and foot exams.  Pharmacy has outreached client for >8 medications.  Requests forms for Advanced Directives but declines social work at this time  Plan:  Send successful outreach letter with a copy of their individualized care plan, Send individual care plan to provider and Send educational material-HTN and Advanced Directives   Chronic care management coordination will outreach in:  9 Months             Pharmacy has made outreach to client.   Peter Garter RN, [name], CDE Chronic Care Management Coordinator [place] [facility] [phone]

## 2018-05-17 ENCOUNTER — Ambulatory Visit: Payer: Self-pay | Admitting: Pharmacist

## 2018-05-17 ENCOUNTER — Other Ambulatory Visit: Payer: Self-pay | Admitting: Pharmacist

## 2018-05-17 NOTE — Patient Outreach (Signed)
[place] [facility]) Care Management  05/17/2018  Derek Curry 1940-03-15 [id number]   Patient was called regarding medication management as patient is a SCNP patient.  Unfortunately, patient did not answer the phone. HIPAA compliant message left on the patient's voicemail.  Plan: Call patient back in 5-7 business days. Will not send letter as he spoke with [facility] Nurse, Peter Garter just a few days ago and there was a mix up with his twin.   Elayne Guerin, PharmD, [place] Clinical Pharmacist [phone]

## 2018-05-17 NOTE — Patient Outreach (Signed)
Epic Error.

## 2018-05-28 ENCOUNTER — Other Ambulatory Visit: Payer: Self-pay | Admitting: Pharmacist

## 2018-05-28 NOTE — Patient Outreach (Signed)
[name]   05/28/2018  Derek Curry Apr 29, 1940 [id number]  Reason for referral: Medication Assistance, Medication Review  Referral source: [place] Management RN with [employer] Current insurance: [employer] C-[employer]  PMHx includes but not limited to:  Hypertension, hyperlipidemia, gout, GERD, and type 2 diabetes.   Outreach:  Successful telephone call with patient.  HIPAA identifiers verified.   Subjective:  Patient reports using a pill box as an adherence strategy.  Does the patient ever forget to take medication?  no Does the patient have problems obtaining medications due to transportation?   no Does the patient have problems obtaining medications due to cost?  no  Does the patient feel that medications prescribed are effective?  yes Does the patient ever experience any side effects to the medications prescribed?  no  Does the patient measure his/her own blood glucose at home?  Yes  131 mg/dl Does the patient measure his/her own blood pressure at home? Yes   Objective: No results found for: CREATININE  No results found for: HGBA1C  Lipid Panel  No results found for: CHOL, TRIG, HDL, CHOLHDL, VLDL, LDLCALC, LDLDIRECT  BP Readings from Last 3 Encounters:  No data found for BP    Allergies  Allergen Reactions  . Shellfish Allergy Anaphylaxis  . Vicodin [Hydrocodone-Acetaminophen]     Mental status changes     Medications Reviewed Today   Medications were not reviewed prior to this encounter     Assessment:  Drugs sorted by system:  Neurologic/Psychologic: Zolpidem  Cardiovascular: Diltiazem, Eliquis, Lisinopril/HCTZ, Simvastatin,    Pulmonary/Allergy: Fluticasone,   Gastrointestinal: Omeprazole,  Renal: Allopurinol,   Infectious Diseases: Valcyclovir  Vitamins/Minerals/Supplements: Magnesium,   Topical Triamcinolone Cream  Medication Review  Findings:  . Combination of simvastatin and diltiazem could cause increased myopathy. Patient is on Simvastatin 10 mg (max allowed dose when used with Diltiazem)   Medication Adherence Findings: Adherence Review  []  Excellent (no doses missed/week)     [x]  Good (no more than 1 dose missed/week)     []  Partial (2-3 doses missed/week) []  Poor (>3 doses missed/week)  Patient with good understanding of regimen and good understanding of indications.    Potential of compliance: good  Medication Assistance Findings:  No medication assistance needs identified  Extra Help:  Already receiving Full Extra Help Low Income Subsidy  Since he has Extra Help, patient does not qualify for patient assistance programs.  Medication Review Findings: Patient is a twin. He and his twin brother have the same name. Although their initials are different, Epic is still pulling over medications that are his brother's onto his profile.  Amlodipine was listed on his profile filled at [facility] but the patient has HTA now.  Metformin was listed as being filled at [facility] along with the patient's other medications but he said he has NEVER taken metformin.   Plan: . Will follow-up in 3 months.  . Will communicate with patient's CSNP nurse, Derek Garter, RN  Derek Curry, PharmD, Derek Curry Clinical Pharmacist [phone]

## 2018-06-05 NOTE — Addendum Note (Signed)
Addended by: Dimitri Ped on: 06/05/2018 03:31 PM   Modules accepted: Miquel Dunn

## 2018-06-11 ENCOUNTER — Ambulatory Visit: Payer: Medicare HMO

## 2018-07-17 DIAGNOSIS — E1142 Type 2 diabetes mellitus with diabetic polyneuropathy: Secondary | ICD-10-CM | POA: Diagnosis not present

## 2018-07-17 DIAGNOSIS — E7800 Pure hypercholesterolemia, unspecified: Secondary | ICD-10-CM | POA: Diagnosis not present

## 2018-07-17 DIAGNOSIS — M1A0720 Idiopathic chronic gout, left ankle and foot, without tophus (tophi): Secondary | ICD-10-CM | POA: Diagnosis not present

## 2018-07-17 DIAGNOSIS — G4709 Other insomnia: Secondary | ICD-10-CM | POA: Diagnosis not present

## 2018-07-18 ENCOUNTER — Ambulatory Visit: Payer: Self-pay | Admitting: Pharmacist

## 2018-07-18 ENCOUNTER — Other Ambulatory Visit: Payer: Self-pay | Admitting: Pharmacist

## 2018-07-18 NOTE — Patient Outreach (Addendum)
[facility]) Care Management  07/18/2018  AMISH MINTZER 1940-11-24 [id number]   Patient was called for Derek Curry Follow Up. Unfortunately, he did not answer the phone. HIPAA compliant message was left on his voicemail.  Plan: Call the patient back in 3-4 months.   Elayne Guerin, PharmD, [place] Clinical Pharmacist [phone]

## 2018-08-03 DIAGNOSIS — R945 Abnormal results of liver function studies: Secondary | ICD-10-CM | POA: Diagnosis not present

## 2018-08-03 DIAGNOSIS — R509 Fever, unspecified: Secondary | ICD-10-CM | POA: Diagnosis not present

## 2018-08-03 DIAGNOSIS — R1084 Generalized abdominal pain: Secondary | ICD-10-CM | POA: Diagnosis not present

## 2018-08-03 DIAGNOSIS — K8020 Calculus of gallbladder without cholecystitis without obstruction: Secondary | ICD-10-CM | POA: Diagnosis not present

## 2018-08-04 ENCOUNTER — Emergency Department (HOSPITAL_BASED_OUTPATIENT_CLINIC_OR_DEPARTMENT_OTHER): Payer: HMO

## 2018-08-04 ENCOUNTER — Emergency Department (HOSPITAL_COMMUNITY): Payer: HMO

## 2018-08-04 ENCOUNTER — Inpatient Hospital Stay (HOSPITAL_BASED_OUTPATIENT_CLINIC_OR_DEPARTMENT_OTHER)
Admission: EM | Admit: 2018-08-04 | Discharge: 2018-08-09 | DRG: 418 | Disposition: A | Discharge status: Home / Self Care | Payer: HMO | Attending: Internal Medicine | Admitting: Internal Medicine

## 2018-08-04 ENCOUNTER — Encounter (HOSPITAL_BASED_OUTPATIENT_CLINIC_OR_DEPARTMENT_OTHER): Payer: Self-pay

## 2018-08-04 ENCOUNTER — Other Ambulatory Visit: Payer: Self-pay

## 2018-08-04 DIAGNOSIS — K915 Postcholecystectomy syndrome: Secondary | ICD-10-CM | POA: Diagnosis not present

## 2018-08-04 DIAGNOSIS — Z7901 Long term (current) use of anticoagulants: Secondary | ICD-10-CM

## 2018-08-04 DIAGNOSIS — Z888 Allergy status to other drugs, medicaments and biological substances status: Secondary | ICD-10-CM | POA: Diagnosis not present

## 2018-08-04 DIAGNOSIS — R509 Fever, unspecified: Secondary | ICD-10-CM | POA: Diagnosis not present

## 2018-08-04 DIAGNOSIS — E785 Hyperlipidemia, unspecified: Secondary | ICD-10-CM | POA: Diagnosis not present

## 2018-08-04 DIAGNOSIS — N179 Acute kidney failure, unspecified: Secondary | ICD-10-CM | POA: Diagnosis not present

## 2018-08-04 DIAGNOSIS — Z8719 Personal history of other diseases of the digestive system: Secondary | ICD-10-CM

## 2018-08-04 DIAGNOSIS — K8070 Calculus of gallbladder and bile duct without cholecystitis without obstruction: Secondary | ICD-10-CM | POA: Diagnosis not present

## 2018-08-04 DIAGNOSIS — Z91013 Allergy to seafood: Secondary | ICD-10-CM | POA: Diagnosis not present

## 2018-08-04 DIAGNOSIS — K8051 Calculus of bile duct without cholangitis or cholecystitis with obstruction: Secondary | ICD-10-CM | POA: Diagnosis not present

## 2018-08-04 DIAGNOSIS — R109 Unspecified abdominal pain: Secondary | ICD-10-CM | POA: Diagnosis present

## 2018-08-04 DIAGNOSIS — K8020 Calculus of gallbladder without cholecystitis without obstruction: Secondary | ICD-10-CM | POA: Diagnosis not present

## 2018-08-04 DIAGNOSIS — K219 Gastro-esophageal reflux disease without esophagitis: Secondary | ICD-10-CM | POA: Diagnosis not present

## 2018-08-04 DIAGNOSIS — R7881 Bacteremia: Secondary | ICD-10-CM | POA: Diagnosis not present

## 2018-08-04 DIAGNOSIS — Z950 Presence of cardiac pacemaker: Secondary | ICD-10-CM

## 2018-08-04 DIAGNOSIS — K8010 Calculus of gallbladder with chronic cholecystitis without obstruction: Secondary | ICD-10-CM | POA: Diagnosis not present

## 2018-08-04 DIAGNOSIS — D696 Thrombocytopenia, unspecified: Secondary | ICD-10-CM | POA: Diagnosis not present

## 2018-08-04 DIAGNOSIS — K8043 Calculus of bile duct with acute cholecystitis with obstruction: Secondary | ICD-10-CM | POA: Diagnosis not present

## 2018-08-04 DIAGNOSIS — K810 Acute cholecystitis: Secondary | ICD-10-CM | POA: Diagnosis not present

## 2018-08-04 DIAGNOSIS — K8042 Calculus of bile duct with acute cholecystitis without obstruction: Secondary | ICD-10-CM | POA: Diagnosis not present

## 2018-08-04 DIAGNOSIS — M109 Gout, unspecified: Secondary | ICD-10-CM | POA: Diagnosis present

## 2018-08-04 DIAGNOSIS — K8309 Other cholangitis: Secondary | ICD-10-CM

## 2018-08-04 DIAGNOSIS — K5730 Diverticulosis of large intestine without perforation or abscess without bleeding: Secondary | ICD-10-CM | POA: Diagnosis not present

## 2018-08-04 DIAGNOSIS — B9620 Unspecified Escherichia coli [E. coli] as the cause of diseases classified elsewhere: Secondary | ICD-10-CM | POA: Diagnosis not present

## 2018-08-04 DIAGNOSIS — R748 Abnormal levels of other serum enzymes: Secondary | ICD-10-CM | POA: Diagnosis not present

## 2018-08-04 DIAGNOSIS — Z7951 Long term (current) use of inhaled steroids: Secondary | ICD-10-CM | POA: Diagnosis not present

## 2018-08-04 DIAGNOSIS — D539 Nutritional anemia, unspecified: Secondary | ICD-10-CM | POA: Diagnosis present

## 2018-08-04 DIAGNOSIS — K8067 Calculus of gallbladder and bile duct with acute and chronic cholecystitis with obstruction: Principal | ICD-10-CM | POA: Diagnosis present

## 2018-08-04 DIAGNOSIS — E1143 Type 2 diabetes mellitus with diabetic autonomic (poly)neuropathy: Secondary | ICD-10-CM | POA: Diagnosis present

## 2018-08-04 DIAGNOSIS — E119 Type 2 diabetes mellitus without complications: Secondary | ICD-10-CM | POA: Diagnosis not present

## 2018-08-04 DIAGNOSIS — I480 Paroxysmal atrial fibrillation: Secondary | ICD-10-CM | POA: Diagnosis not present

## 2018-08-04 DIAGNOSIS — Z79899 Other long term (current) drug therapy: Secondary | ICD-10-CM | POA: Diagnosis not present

## 2018-08-04 DIAGNOSIS — R7989 Other specified abnormal findings of blood chemistry: Secondary | ICD-10-CM

## 2018-08-04 DIAGNOSIS — R1084 Generalized abdominal pain: Secondary | ICD-10-CM | POA: Diagnosis not present

## 2018-08-04 DIAGNOSIS — Z885 Allergy status to narcotic agent status: Secondary | ICD-10-CM | POA: Diagnosis not present

## 2018-08-04 DIAGNOSIS — R0602 Shortness of breath: Secondary | ICD-10-CM | POA: Diagnosis not present

## 2018-08-04 DIAGNOSIS — Z20828 Contact with and (suspected) exposure to other viral communicable diseases: Secondary | ICD-10-CM | POA: Diagnosis not present

## 2018-08-04 DIAGNOSIS — K3184 Gastroparesis: Secondary | ICD-10-CM | POA: Diagnosis present

## 2018-08-04 DIAGNOSIS — K8050 Calculus of bile duct without cholangitis or cholecystitis without obstruction: Secondary | ICD-10-CM

## 2018-08-04 DIAGNOSIS — R1011 Right upper quadrant pain: Secondary | ICD-10-CM | POA: Diagnosis not present

## 2018-08-04 DIAGNOSIS — I10 Essential (primary) hypertension: Secondary | ICD-10-CM | POA: Diagnosis not present

## 2018-08-04 DIAGNOSIS — K831 Obstruction of bile duct: Secondary | ICD-10-CM | POA: Diagnosis not present

## 2018-08-04 DIAGNOSIS — R945 Abnormal results of liver function studies: Secondary | ICD-10-CM | POA: Diagnosis not present

## 2018-08-04 LAB — POCT I-STAT EG7
Acid-Base Excess: 3 mmol/L — ABNORMAL HIGH (ref 0.0–2.0)
Bicarbonate: 29.4 mmol/L — ABNORMAL HIGH (ref 20.0–28.0)
Calcium, Ion: 1.23 mmol/L (ref 1.15–1.40)
HCT: 39 % (ref 39.0–52.0)
Hemoglobin: 13.3 g/dL (ref 13.0–17.0)
O2 Saturation: 25 %
Patient temperature: 102.7
Potassium: 4.8 mmol/L (ref 3.5–5.1)
Sodium: 138 mmol/L (ref 135–145)
TCO2: 31 mmol/L (ref 22–32)
pCO2, Ven: 53.8 mmHg (ref 44.0–60.0)
pH, Ven: 7.354 (ref 7.250–7.430)
pO2, Ven: 21 mmHg — CL (ref 32.0–45.0)

## 2018-08-04 LAB — CBC WITH DIFFERENTIAL/PLATELET
Abs Immature Granulocytes: 0.03 10*3/uL (ref 0.00–0.07)
Basophils Absolute: 0 10*3/uL (ref 0.0–0.1)
Basophils Relative: 0 %
Eosinophils Absolute: 0 10*3/uL (ref 0.0–0.5)
Eosinophils Relative: 0 %
HCT: 38.9 % — ABNORMAL LOW (ref 39.0–52.0)
Hemoglobin: 12.7 g/dL — ABNORMAL LOW (ref 13.0–17.0)
Immature Granulocytes: 0 %
Lymphocytes Relative: 13 %
Lymphs Abs: 1.1 10*3/uL (ref 0.7–4.0)
MCH: 33.6 pg (ref 26.0–34.0)
MCHC: 32.6 g/dL (ref 30.0–36.0)
MCV: 102.9 fL — ABNORMAL HIGH (ref 80.0–100.0)
Monocytes Absolute: 0.2 10*3/uL (ref 0.1–1.0)
Monocytes Relative: 2 %
Neutro Abs: 7.1 10*3/uL (ref 1.7–7.7)
Neutrophils Relative %: 85 %
Platelets: 178 10*3/uL (ref 150–400)
RBC: 3.78 MIL/uL — ABNORMAL LOW (ref 4.22–5.81)
RDW: 13.8 % (ref 11.5–15.5)
WBC: 8.4 10*3/uL (ref 4.0–10.5)
nRBC: 0 % (ref 0.0–0.2)

## 2018-08-04 LAB — URINALYSIS, ROUTINE W REFLEX MICROSCOPIC
Glucose, UA: NEGATIVE mg/dL
Hgb urine dipstick: NEGATIVE
Ketones, ur: NEGATIVE mg/dL
Leukocytes,Ua: NEGATIVE
Nitrite: NEGATIVE
Protein, ur: NEGATIVE mg/dL
Specific Gravity, Urine: 1.015 (ref 1.005–1.030)
pH: 6 (ref 5.0–8.0)

## 2018-08-04 LAB — PROTIME-INR
INR: 1.2 (ref 0.8–1.2)
Prothrombin Time: 14.8 seconds (ref 11.4–15.2)

## 2018-08-04 LAB — COMPREHENSIVE METABOLIC PANEL
ALT: 132 U/L — ABNORMAL HIGH (ref 0–44)
AST: 244 U/L — ABNORMAL HIGH (ref 15–41)
Albumin: 3.6 g/dL (ref 3.5–5.0)
Alkaline Phosphatase: 249 U/L — ABNORMAL HIGH (ref 38–126)
Anion gap: 12 (ref 5–15)
BUN: 23 mg/dL (ref 8–23)
CO2: 23 mmol/L (ref 22–32)
Calcium: 9.6 mg/dL (ref 8.9–10.3)
Chloride: 102 mmol/L (ref 98–111)
Creatinine, Ser: 1.65 mg/dL — ABNORMAL HIGH (ref 0.61–1.24)
GFR calc Af Amer: 46 mL/min — ABNORMAL LOW (ref >60)
GFR calc non Af Amer: 39 mL/min — ABNORMAL LOW (ref >60)
Glucose, Bld: 137 mg/dL — ABNORMAL HIGH (ref 70–99)
Potassium: 4 mmol/L (ref 3.5–5.1)
Sodium: 137 mmol/L (ref 135–145)
Total Bilirubin: 2.8 mg/dL — ABNORMAL HIGH (ref 0.3–1.2)
Total Protein: 8.3 g/dL — ABNORMAL HIGH (ref 6.5–8.1)

## 2018-08-04 LAB — APTT
aPTT: 35 seconds (ref 24–36)
aPTT: 48 seconds — ABNORMAL HIGH (ref 24–36)

## 2018-08-04 LAB — SARS CORONAVIRUS 2 AG (30 MIN TAT): SARS Coronavirus 2 Ag: NEGATIVE

## 2018-08-04 LAB — LACTIC ACID, PLASMA: Lactic Acid, Venous: 1.7 mmol/L (ref 0.5–1.9)

## 2018-08-04 LAB — LIPASE, BLOOD: Lipase: 31 U/L (ref 11–51)

## 2018-08-04 LAB — SARS CORONAVIRUS 2 BY RT PCR (HOSPITAL ORDER, PERFORMED IN ~~LOC~~ HOSPITAL LAB): SARS Coronavirus 2: NEGATIVE

## 2018-08-04 LAB — ETHANOL: Alcohol, Ethyl (B): <10 mg/dL (ref <10)

## 2018-08-04 LAB — HEPARIN LEVEL (UNFRACTIONATED): Heparin Unfractionated: 1.02 IU/mL — ABNORMAL HIGH (ref 0.30–0.70)

## 2018-08-04 MED ORDER — ONDANSETRON HCL 4 MG/2ML IJ SOLN: 4.0000 mg | Freq: Four times a day (QID) | INTRAMUSCULAR | Status: DC | PRN

## 2018-08-04 MED ORDER — IOHEXOL 300 MG/ML  SOLN
100.0000 mL | Freq: Once | INTRAMUSCULAR | Status: AC | PRN
  Administered 2018-08-04: 02:00:00 80 mL via INTRAVENOUS

## 2018-08-04 MED ORDER — ONDANSETRON HCL 4 MG/2ML IJ SOLN
4.0000 mg | Freq: Once | INTRAMUSCULAR | Status: AC
  Administered 2018-08-04: 04:00:00 4 mg via INTRAVENOUS
  Filled 2018-08-04: qty 2

## 2018-08-04 MED ORDER — FENTANYL CITRATE (PF) 100 MCG/2ML IJ SOLN: 25.0000 ug | INTRAMUSCULAR | Status: DC | PRN

## 2018-08-04 MED ORDER — SODIUM CHLORIDE 0.9 % IV SOLN
INTRAVENOUS | Status: DC
  Administered 2018-08-04 – 2018-08-08 (×10): via INTRAVENOUS

## 2018-08-04 MED ORDER — PIPERACILLIN-TAZOBACTAM 3.375 G IVPB
3.3750 g | Freq: Three times a day (TID) | INTRAVENOUS | Status: DC
  Administered 2018-08-04 – 2018-08-07 (×9): 3.375 g via INTRAVENOUS
  Filled 2018-08-04 (×9): qty 50

## 2018-08-04 MED ORDER — PIPERACILLIN-TAZOBACTAM 3.375 G IVPB 30 MIN
3.3750 g | Freq: Once | INTRAVENOUS | Status: AC
  Administered 2018-08-04: 3.375 g via INTRAVENOUS
  Filled 2018-08-04: qty 50

## 2018-08-04 MED ORDER — SODIUM CHLORIDE 0.9 % IV SOLN: Freq: Once | INTRAVENOUS | Status: DC

## 2018-08-04 MED ORDER — HEPARIN (PORCINE) 25000 UT/250ML-% IV SOLN
1250.0000 [IU]/h | INTRAVENOUS | Status: DC
  Administered 2018-08-04 – 2018-08-05 (×2): 1100 [IU]/h via INTRAVENOUS
  Filled 2018-08-04 (×3): qty 250

## 2018-08-04 MED ORDER — MORPHINE SULFATE (PF) 2 MG/ML IV SOLN
2.0000 mg | INTRAVENOUS | Status: DC | PRN
  Filled 2018-08-04: qty 1

## 2018-08-04 MED ORDER — HYDRALAZINE HCL 20 MG/ML IJ SOLN: 10.0000 mg | Freq: Four times a day (QID) | INTRAMUSCULAR | Status: DC | PRN

## 2018-08-04 MED ORDER — FENTANYL CITRATE (PF) 100 MCG/2ML IJ SOLN
50.0000 ug | Freq: Once | INTRAMUSCULAR | Status: AC
  Administered 2018-08-04: 04:00:00 50 ug via INTRAVENOUS
  Filled 2018-08-04: qty 2

## 2018-08-04 MED ORDER — SODIUM CHLORIDE 0.9 % IV BOLUS
1000.0000 mL | Freq: Once | INTRAVENOUS | Status: AC
  Administered 2018-08-04: 02:00:00 1000 mL via INTRAVENOUS

## 2018-08-04 MED ORDER — ACETAMINOPHEN 325 MG PO TABS
650.0000 mg | ORAL_TABLET | Freq: Once | ORAL | Status: AC
  Administered 2018-08-04: 02:00:00 650 mg via ORAL
  Filled 2018-08-04: qty 2

## 2018-08-04 MED ORDER — LACTATED RINGERS IV BOLUS
1000.0000 mL | Freq: Once | INTRAVENOUS | Status: AC
  Administered 2018-08-04: 06:00:00 1000 mL via INTRAVENOUS

## 2018-08-04 MED ORDER — DILTIAZEM HCL ER COATED BEADS 240 MG PO CP24
240.0000 mg | ORAL_CAPSULE | Freq: Every day | ORAL | Status: DC
  Administered 2018-08-04 – 2018-08-09 (×5): 240 mg via ORAL
  Filled 2018-08-04 (×5): qty 1

## 2018-08-04 NOTE — Progress Notes (Signed)
ANTICOAGULATION CONSULT NOTE - Initial Consult  Pharmacy Consult for heparin Indication: atrial fibrillation  Allergies  Allergen Reactions  . Benazepril Anaphylaxis  . Shellfish Allergy Swelling  . Vicodin [Hydrocodone-Acetaminophen] Other (See Comments)    Dizzy    Patient Measurements: Height: 5\' 8"  (172.7 cm) Weight: 173 lb (78.5 kg) IBW/kg (Calculated) : 68.4 Heparin Dosing Weight = TBW = 78 kg  Vital Signs: Temp: 99 F (37.2 C) (06/20 0954) Temp Source: Oral (06/20 0954) BP: 108/58 (06/20 0954) Pulse Rate: 78 (06/20 0954)  Labs: Recent Labs    08/04/18 0033 08/04/18 0102 08/04/18 0818  HGB 12.7* 13.3  --   HCT 38.9* 39.0  --   PLT 178  --   --   APTT  --   --  35  LABPROT  --   --  14.8  INR  --   --  1.2  HEPARINUNFRC  --   --  1.02*  CREATININE 1.65*  --   --     Estimated Creatinine Clearance: 36.3 mL/min (A) (by C-G formula based on SCr of 1.65 mg/dL (H)).   Medical History: Past Medical History:  Diagnosis Date  . Diabetes mellitus   . GERD (gastroesophageal reflux disease)   . Gout   . Hypertension   . Prostatitis     Assessment: Patient admitted with abdominal pain - surgery consulted. Planning for possible surgical intervention on Monday. Pt was taking apixaban 5 mg PO BID PTA for atrial fibrillation. Anticoagulation being converted to heparin drip in anticipation of surgery.  Last dose of apixaban: 6/19 @ 2200  Today, 08/04/18  Hgb 13.3 - WNL  Plt 178 - WNL  Baseline HL = 1.02, expected to be falsely elevated at baseline due to apixaban  Baseline aPTT, INR WNL   Goal of Therapy:  Heparin level 0.3-0.7 units/ml APTT 66-102 seconds Monitor platelets by anticoagulation protocol: Yes   Plan:   No bolus as patient anticoagulation on apixaban prior to initiation of heparin  Start heparin drip 12 hours after last apixaban dose  Start heparin infusion at 1100 units/hr  Check aPTT in 8 hours  Monitor using aPTT until aPTT and  HL correlate   CBC and HL/aPTT daily while on heparin  Monitor for transition back to apixaban post-op  Lenis Noon, PharmD 08/04/2018,10:57 AM

## 2018-08-04 NOTE — ED Notes (Signed)
Derek Curry (Spouse) updated on plan of care and she stated that she would follow the carelink truck to the other hospital. Pt updated on spouse being updated

## 2018-08-04 NOTE — ED Provider Notes (Signed)
4:30 AM Assumed care from Dr. Florina Ou at [facility], please see their note for full history, physical and decision making until this point. In brief this is a 78 y.o. year old male who presented to the ED tonight with Abdominal Pain     Multiple medical problems who presents the emergency department as a transfer from [place].  Patient was in her normal state health until around 19th event significant right flank pain.  Patient states he had a pretty fatty meal around 1800.  He was seen at [facility] where a CT scan and labs revealed that he had cholelithiasis and questionable gallbladder wall thickening as well.  Has AST and ALT were elevated along with alk phos and bilirubin.  Radiology recommended ultrasound so was sent here to receive that study.  Review of his vital signs it does appear that he is tachycardic, febrile, tachypneic.  Patient's mental status is appropriate he does not appear particularly jaundiced.  This is concerning for possible ascending cholangitis so dose of Zosyn and fluids were given.  Ultrasound was ordered.  Ultrasound shows cholelithiasis without any other evidence of cholecystitis.  At this point count of unclear what the etiology is may be needs a HIDA scan as his clinical symptoms seem pretty consistent with cholecystitis.  Will d/w medicine.   CRITICAL CARE Performed by: Merrily Pew Total critical care time: 32 minutes Critical care time was exclusive of separately billable procedures and treating other patients. Critical care was necessary to treat or prevent imminent or life-threatening deterioration. Critical care was time spent personally by me on the following activities: development of treatment plan with patient and/or surrogate as well as nursing, discussions with consultants, evaluation of patient's response to treatment, examination of patient, obtaining history from patient or surrogate, ordering and performing treatments and  interventions, ordering and review of laboratory studies, ordering and review of radiographic studies, pulse oximetry and re-evaluation of patient's condition.   Labs, studies and imaging reviewed by myself and considered in medical decision making if ordered. Imaging interpreted by radiology.  Labs Reviewed  COMPREHENSIVE METABOLIC PANEL - Abnormal; Notable for the following components:      Result Value   Glucose, Bld 137 (*)    Creatinine, Ser 1.65 (*)    Total Protein 8.3 (*)    AST 244 (*)    ALT 132 (*)    Alkaline Phosphatase 249 (*)    Total Bilirubin 2.8 (*)    GFR calc non Af Amer 39 (*)    GFR calc Af Amer 46 (*)    All other components within normal limits  CBC WITH DIFFERENTIAL/PLATELET - Abnormal; Notable for the following components:   RBC 3.78 (*)    Hemoglobin 12.7 (*)    HCT 38.9 (*)    MCV 102.9 (*)    All other components within normal limits  URINALYSIS, ROUTINE W REFLEX MICROSCOPIC - Abnormal; Notable for the following components:   Color, Urine ORANGE (*)    Bilirubin Urine SMALL (*)    All other components within normal limits  POCT I-STAT EG7 - Abnormal; Notable for the following components:   pO2, Ven 21.0 (*)    Bicarbonate 29.4 (*)    Acid-Base Excess 3.0 (*)    All other components within normal limits  SARS CORONAVIRUS 2 (HOSP ORDER, PERFORMED IN [place] LAB VIA ABBOTT ID)  CULTURE, BLOOD (ROUTINE X 2)  CULTURE, BLOOD (ROUTINE X 2)  LIPASE, BLOOD  LACTIC ACID,  PLASMA  ETHANOL  I-STAT VENOUS BLOOD GAS, ED    CT ABDOMEN PELVIS W CONTRAST  Final Result    DG Chest Port 1 View  Final Result    US Abdomen Limited RUQ    (Results Pending)    No follow-ups on file.    Merrily Pew, MD 08/05/18 1126

## 2018-08-04 NOTE — Progress Notes (Addendum)
PHARMACY - HEPARIN (brief note)  Patient on IV heparin as bridge therapy for planned surgery on 6/22.    18:45 aPTT = 48 sec (Goal 66-102sec) with heparin infusing at 1100 units/hr (14 units/kg/hr).  Spoke with RN who discovered heparin not infusing appropriately, as pump stated "infusion complete."  Pump settings reset by RN.  Plan:  Continue IV heparin @ 1100 units/hr           Recheck aPTT along with heparin level in 6 hr  Leone Haven, PharmD 08/04/18 @ 20:40

## 2018-08-04 NOTE — Consult Note (Signed)
Reason for Consult:RUQ pain Referring Physician: Dr [name] is an 78 y.o. male.  HPI: Pt presents to the ED with intractable RUQ pain.  This has been intermittently occurring for the last few weeks.  Denies nausea or bowel changes.  He take Elliquis for Afib.  Last dose was 10 pm on 6/19.  Past Medical History:  Diagnosis Date  . Diabetes mellitus   . GERD (gastroesophageal reflux disease)   . Gout   . Hypertension   . Prostatitis     Past Surgical History:  Procedure Laterality Date  . PACEMAKER INSERTION      No family history on file.  Social History:  reports that he has never smoked. He does not have any smokeless tobacco history on file. He reports that he does not drink alcohol or use drugs.  Allergies:  Allergies  Allergen Reactions  . Benazepril Anaphylaxis  . Shellfish Allergy Swelling  . Vicodin [Hydrocodone-Acetaminophen] Other (See Comments)    Dizzy    Medications: I have reviewed the patient's current medications.  Results for orders placed or performed during the hospital encounter of 08/04/18 (from the past 48 hour(s))  Comprehensive metabolic panel     Status: Abnormal   Collection Time: 08/04/18 12:33 AM  Result Value Ref Range   Sodium 137 135 - 145 mmol/L   Potassium 4.0 3.5 - 5.1 mmol/L    Comment: SLIGHT HEMOLYSIS   Chloride 102 98 - 111 mmol/L   CO2 23 22 - 32 mmol/L   Glucose, Bld 137 (H) 70 - 99 mg/dL   BUN 23 8 - 23 mg/dL   Creatinine, Ser 1.65 (H) 0.61 - 1.24 mg/dL   Calcium 9.6 8.9 - 10.3 mg/dL   Total Protein 8.3 (H) 6.5 - 8.1 g/dL   Albumin 3.6 3.5 - 5.0 g/dL   AST 244 (H) 15 - 41 U/L   ALT 132 (H) 0 - 44 U/L   Alkaline Phosphatase 249 (H) 38 - 126 U/L   Total Bilirubin 2.8 (H) 0.3 - 1.2 mg/dL   GFR calc non Af Amer 39 (L) >60 mL/min   GFR calc Af Amer 46 (L) >60 mL/min   Anion gap 12 5 - 15    Comment: Performed at [facility], [address] [place], [place] [address]  CBC with  Differential/Platelet     Status: Abnormal   Collection Time: 08/04/18 12:33 AM  Result Value Ref Range   WBC 8.4 4.0 - 10.5 K/uL   RBC 3.78 (L) 4.22 - 5.81 MIL/uL   Hemoglobin 12.7 (L) 13.0 - 17.0 g/dL   HCT 38.9 (L) 39.0 - 52.0 %   MCV 102.9 (H) 80.0 - 100.0 fL   MCH 33.6 26.0 - 34.0 pg   MCHC 32.6 30.0 - 36.0 g/dL   RDW 13.8 11.5 - 15.5 %   Platelets 178 150 - 400 K/uL   nRBC 0.0 0.0 - 0.2 %   Neutrophils Relative % 85 %   Neutro Abs 7.1 1.7 - 7.7 K/uL   Lymphocytes Relative 13 %   Lymphs Abs 1.1 0.7 - 4.0 K/uL   Monocytes Relative 2 %   Monocytes Absolute 0.2 0.1 - 1.0 K/uL   Eosinophils Relative 0 %   Eosinophils Absolute 0.0 0.0 - 0.5 K/uL   Basophils Relative 0 %   Basophils Absolute 0.0 0.0 - 0.1 K/uL   Immature Granulocytes 0 %   Abs Immature Granulocytes 0.03 0.00 - 0.07 K/uL  Comment: Performed at [facility], [address] [place], [place] [address]  Lipase, blood     Status: None   Collection Time: 08/04/18 12:33 AM  Result Value Ref Range   Lipase 31 11 - 51 U/L    Comment: Performed at [facility], [address] [place], [place] [address]  SARS Coronavirus 2 (Hosp order,Performed in [facility] lab via Abbott ID)     Status: None   Collection Time: 08/04/18 12:33 AM   Specimen: Dry Nasal Swab (Abbott ID Now)  Result Value Ref Range   SARS Coronavirus 2 (Abbott ID Now) NEGATIVE NEGATIVE    Comment: (NOTE) SARS-CoV-2 target nucleic acids are NOT DETECTED. The SARS-CoV-2 RNA is generally detectable in upper and lower respiratory specimens during the acute phase of infection.  Negativeresults do not preclude SARS-CoV-2 infection, do not rule out coinfections with other pathogens, and should not be used as the  sole basis for treatment or other patient management decisions.  Negative results must be combined with clinical observations, patient history, and epidemiological information. The expected result is Negative. Fact Sheet for  Patients: GolfingFamily.no Fact Sheet for Healthcare Providers: https://www.hernandez-brewer.com/ This test is not yet approved or cleared by the [place] FDA and  has been authorized for detection and/or diagnosis of SARS-CoV-2 by FDA under an Emergency Use Authorization (EUA).  This EUA will remain in effect (meaning this test can be used) for the duration of  the COVID19 declaration under Section 5 64(b)(1) of the Act, 21 U.S.C.  section [phone] 3(b)(1), unless the authorization is terminated or revoked sooner. Performed at [facility], [address] [place], [place] [address]   Lactic acid, plasma     Status: None   Collection Time: 08/04/18 12:37 AM  Result Value Ref Range   Lactic Acid, Venous 1.7 0.5 - 1.9 mmol/L    Comment: Performed at [facility], [address] [place], [place] [address]  Ethanol     Status: None   Collection Time: 08/04/18 12:42 AM  Result Value Ref Range   Alcohol, Ethyl (B) <10 <10 mg/dL    Comment: (NOTE) Lowest detectable limit for serum alcohol is 10 mg/dL. For medical purposes only. Performed at [facility], [address] [place], [place] [address]   POCT I-Stat EG7     Status: Abnormal   Collection Time: 08/04/18  1:02 AM  Result Value Ref Range   pH, Ven 7.354 7.250 - 7.430   pCO2, Ven 53.8 44.0 - 60.0 mmHg   pO2, Ven 21.0 (LL) 32.0 - 45.0 mmHg   Bicarbonate 29.4 (H) 20.0 - 28.0 mmol/L   TCO2 31 22 - 32 mmol/L   O2 Saturation 25.0 %   Acid-Base Excess 3.0 (H) 0.0 - 2.0 mmol/L   Sodium 138 135 - 145 mmol/L   Potassium 4.8 3.5 - 5.1 mmol/L   Calcium, Ion 1.23 1.15 - 1.40 mmol/L   HCT 39.0 39.0 - 52.0 %   Hemoglobin 13.3 13.0 - 17.0 g/dL   Patient temperature 102.7 F    Sample type VENOUS    Comment NOTIFIED PHYSICIAN   Urinalysis, Routine w reflex microscopic     Status: Abnormal   Collection Time: 08/04/18  2:15 AM  Result Value Ref Range   Color, Urine  ORANGE (A) YELLOW    Comment: BIOCHEMICALS MAY BE AFFECTED BY COLOR   APPearance CLEAR CLEAR   Specific Gravity,  Urine 1.015 1.005 - 1.030   pH 6.0 5.0 - 8.0   Glucose, UA NEGATIVE NEGATIVE mg/dL   Hgb urine dipstick NEGATIVE NEGATIVE   Bilirubin Urine SMALL (A) NEGATIVE   Ketones, ur NEGATIVE NEGATIVE mg/dL   Protein, ur NEGATIVE NEGATIVE mg/dL   Nitrite NEGATIVE NEGATIVE   Leukocytes,Ua NEGATIVE NEGATIVE    Comment: Microscopic not done on urines with negative protein, blood, leukocytes, nitrite, or glucose < 500 mg/dL. Performed at [facility], [address] [place], [place] [address]   SARS Coronavirus 2 (CEPHEID - Performed in [facility] hospital lab), Hosp Order     Status: None   Collection Time: 08/04/18  6:25 AM   Specimen: Nasopharyngeal Swab  Result Value Ref Range   SARS Coronavirus 2 NEGATIVE NEGATIVE    Comment: (NOTE) If result is NEGATIVE SARS-CoV-2 target nucleic acids are NOT DETECTED. The SARS-CoV-2 RNA is generally detectable in upper and lower  respiratory specimens during the acute phase of infection. The lowest  concentration of SARS-CoV-2 viral copies this assay can detect is 250  copies / mL. A negative result does not preclude SARS-CoV-2 infection  and should not be used as the sole basis for treatment or other  patient management decisions.  A negative result may occur with  improper specimen collection / handling, submission of specimen other  than nasopharyngeal swab, presence of viral mutation(s) within the  areas targeted by this assay, and inadequate number of viral copies  (<250 copies / mL). A negative result must be combined with clinical  observations, patient history, and epidemiological information. If result is POSITIVE SARS-CoV-2 target nucleic acids are DETECTED. The SARS-CoV-2 RNA is generally detectable in upper and lower  respiratory specimens dur ing the acute phase of infection.  Positive  results are indicative of  active infection with SARS-CoV-2.  Clinical  correlation with patient history and other diagnostic information is  necessary to determine patient infection status.  Positive results do  not rule out bacterial infection or co-infection with other viruses. If result is PRESUMPTIVE POSTIVE SARS-CoV-2 nucleic acids MAY BE PRESENT.   A presumptive positive result was obtained on the submitted specimen  and confirmed on repeat testing.  While 2019 novel coronavirus  (SARS-CoV-2) nucleic acids may be present in the submitted sample  additional confirmatory testing may be necessary for epidemiological  and / or clinical management purposes  to differentiate between  SARS-CoV-2 and other Sarbecovirus currently known to infect humans.  If clinically indicated additional testing with an alternate test  methodology [phone]) is advised. The SARS-CoV-2 RNA is generally  detectable in upper and lower respiratory sp ecimens during the acute  phase of infection. The expected result is Negative. Fact Sheet for Patients:  StrictlyIdeas.no Fact Sheet for Healthcare Providers: BankingDealers.co.za This test is not yet approved or cleared by the [place] FDA and has been authorized for detection and/or diagnosis of SARS-CoV-2 by FDA under an Emergency Use Authorization (EUA).  This EUA will remain in effect (meaning this test can be used) for the duration of the COVID-19 declaration under Section 564(b)(1) of the Act, 21 U.S.C. section 360bbb-3(b)(1), unless the authorization is terminated or revoked sooner. Performed at [facility], [place] [address], [place], [place] [address]   APTT     Status: None   Collection Time: 08/04/18  8:18 AM  Result Value Ref Range   aPTT 35 24 - 36 seconds    Comment: Performed at [facility], [place] [address] [place], [place] [address]  Protime-INR     Status: None   Collection  Time: 08/04/18  8:18 AM  Result Value Ref Range   Prothrombin Time 14.8 11.4 - 15.2 seconds   INR 1.2 0.8 - 1.2    Comment: (NOTE) INR goal varies based on device and disease states. Performed at [facility], [place] [address] [place], [place] [address]   Heparin level (unfractionated)     Status: Abnormal   Collection Time: 08/04/18  8:18 AM  Result Value Ref Range   Heparin Unfractionated 1.02 (H) 0.30 - 0.70 IU/mL    Comment: (NOTE) If heparin results are below expected values, and patient dosage has  been confirmed, suggest follow up testing of antithrombin III levels. Performed at [facility], [place] [address], [place], [place] [address]     Ct Abdomen Pelvis W Contrast  Result Date: 08/04/2018 CLINICAL DATA:  Diffuse abdominal pain.  Fever. EXAM: CT ABDOMEN AND PELVIS WITH CONTRAST TECHNIQUE: Multidetector CT imaging of the abdomen and pelvis was performed using the standard protocol following bolus administration of intravenous contrast. CONTRAST:  90mL OMNIPAQUE IOHEXOL 300 MG/ML  SOLN COMPARISON:  Report from abdominal CT 03/13/2014, images not available. FINDINGS: Lower chest: Linear atelectasis in both lower lobes. Pacemaker partially included. Hepatobiliary: Subcentimeter cyst in the anterior left lobe. Layering hyperdensity in the gallbladder may represent stones or sludge. Suggestion of mild gallbladder wall thickening at 4 mm. No biliary dilatation. Pancreas: Mild parenchymal atrophy. No ductal dilatation or inflammation. Spleen: Normal in size without focal abnormality. Adrenals/Urinary Tract: No adrenal nodule. No hydronephrosis or perinephric edema. Homogeneous renal enhancement with symmetric excretion on delayed phase imaging. Multiple low-density lesions in the right greater than left kidney are likely cyst, some of which are too small to characterize. Urinary bladder is physiologically distended without wall thickening. Stomach/Bowel:  Prominent gastric distention with ingested material. There is normal positioning of the ligament of Treitz, however small bowel than courses in the right abdomen. No associated bowel wall thickening, inflammation, or obstruction. Colon is normally positioned. Appendix surgically absent per history. No pericecal inflammation. Multifocal colonic diverticulosis. No diverticulitis. Mild sigmoid colonic redundancy. Vascular/Lymphatic: Normal caliber abdominal aorta. Portal vein and mesenteric vessels are patent. No adenopathy. Reproductive: Prostate is unremarkable. Other: No free air, free fluid, or intra-abdominal fluid collection. Small fat containing umbilical hernia Musculoskeletal: There are no acute or suspicious osseous abnormalities. Degenerative change in the spine. IMPRESSION: 1. Gallstones with possible gallbladder wall thickening. Recommend right upper quadrant ultrasound for further evaluation. 2. Marked gastric distention with ingested material, raising concern for gastroparesis or delayed gastric emptying. 3. Normal positioning of the ligament of Treitz, however proximal jejunum then courses abnormally into the right abdomen. It is unclear whether this is secondary to displacement related to distended stomach versus partial small bowel malrotation. No associated bowel inflammation or obstruction. 4. Colonic diverticulosis without diverticulitis. Electronically Signed   By: Keith Rake M.D.   On: 08/04/2018 02:42   Dg Chest Port 1 View  Result Date: 08/04/2018 CLINICAL DATA:  Shortness of breath. Fever. Chills. 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 Unchanged heart size and mediastinal contours. No pulmonary edema, focal airspace disease, pleural effusion or pneumothorax. IMPRESSION: No acute chest finding. Electronically Signed   By: Keith Rake M.D.   On: 08/04/2018 01:33   US Abdomen Limited Ruq  Result  Date: 08/04/2018 CLINICAL DATA:  Ascending cholangitis EXAM: ULTRASOUND ABDOMEN LIMITED RIGHT UPPER QUADRANT COMPARISON:  08/04/2018 CT abdomen/pelvis FINDINGS: Gallbladder: No gallbladder distention. Ring down artifact in the nondependent gallbladder wall indicative of adenomyomatosis. Layering subcentimeter shadowing gallstones. No sonographic Murphy sign. No pericholecystic fluid. Common bile duct: Diameter: 5 mm Liver: No focal lesion identified. Within normal limits in parenchymal echogenicity. Portal vein is patent on color Doppler imaging with normal direction of blood flow towards the liver. IMPRESSION: 1. Cholelithiasis.  No sonographic evidence of acute cholecystitis. 2. Findings of adenomyomatosis of the gallbladder. 3. No biliary ductal dilatation. 4. Normal liver. Electronically Signed   By: Ilona Sorrel M.D.   On: 08/04/2018 05:25    Review of Systems  Constitutional: Negative for chills and fever.  HENT: Negative for hearing loss.   Eyes: Negative for blurred vision.  Respiratory: Negative for cough and shortness of breath.   Cardiovascular: Negative for chest pain and palpitations.  Gastrointestinal: Positive for abdominal pain. Negative for constipation, diarrhea, nausea and vomiting.  Genitourinary: Negative for dysuria and urgency.  Musculoskeletal: Negative for myalgias.  Neurological: Negative for dizziness and headaches.   Blood pressure (!) 108/58, pulse 78, temperature 99 F (37.2 C), temperature source Oral, resp. rate 18, height 5\' 8"  (1.727 m), weight 78.5 kg, SpO2 100 %. Physical Exam  Constitutional: He is oriented to person, place, and time. He appears well-developed and well-nourished.  HENT:  Head: Normocephalic and atraumatic.  Eyes: Pupils are equal, round, and reactive to light. Conjunctivae and EOM are normal.  Neck: Normal range of motion. Neck supple.  Cardiovascular: Normal rate and regular rhythm.  Respiratory: Effort normal. No respiratory distress.   GI: Soft. He exhibits no distension. There is abdominal tenderness (mild RUQ TTP).  Musculoskeletal: Normal range of motion.  Neurological: He is alert and oriented to person, place, and time.  Skin: Skin is warm and dry.    Assessment/Plan: 78 y.o. M with obstructive jaundice with no signs of cholecystitis on Elliquis.  T bili elevated.  MRCP per primary MD.  Repeat labs in AM  Plan on OR on Mon, once Elliquis wears off as long a bili does not continue to trend up  Rosario Adie 4/43/1540, 10:00 AM

## 2018-08-04 NOTE — ED Notes (Signed)
Report given to Jansen, Charge RN [facility]

## 2018-08-04 NOTE — H&P (Signed)
History and Physical    Derek Curry TDD:[id number] DOB: 05-04-1940 DOA: 08/04/2018  PCP: Jefm Petty, MD   Patient coming from: [facility] [place]    Chief Complaint: Abdominal pain  HPI: Derek Curry is a 78 y.o. male  with hx of  DM, HTN,  Afib,Gout  who  presents with acute abdominal pain after a fatty meal at around 6 pm yesterday .  Patient has been having pain on his right side of the abdomen on and off for last 1 to 2 weeks.  Pain was unbearable and severe yesterday evening so he decided to come to [place].  Patient describes the pain as dull and rates as 8/10.  It became worse with movement.  Patient also reported of having subjective fevers at home with chills.  He denied any nausea, vomiting or diarrhea.  His bowel movements have been normal.  He also complained of shortness of breath when lying flat in the bed. Patient is physically active  and is actively working as a Curator.  He  has history of atrial fibrillation and is on Eliquis at home and follows with Dr. Minna Merritts at [facility]. Patient seen and examined the bedside.  Currently he is hemodynamically stable.  Denies any abdominal pain while on rest. Denies any chest pain, cough, dysuria.   ED Course: Found to be tachypneic on arrival.  Noted to have fever of 102 Fahrenheit when checked rectally.  Satting in the range of low 90s.  CT abdomen/pelvis showed gallstones with gallbladder wall thickening, possible gastroparesis.  Liver enzymes were elevated.  Clinical picture suggestive of acute cholecystitis.  Kept n.p.o.  Started on antibiotics.  General surgery consulted.  Review of Systems: As per HPI otherwise 10 point review of systems negative.    Past Medical History:  Diagnosis Date   Diabetes mellitus    GERD (gastroesophageal reflux disease)    Gout    Hypertension    Prostatitis     Past Surgical History:  Procedure Laterality Date   PACEMAKER INSERTION       reports that he  has never smoked. He does not have any smokeless tobacco history on file. He reports that he does not drink alcohol or use drugs.  Allergies  Allergen Reactions   Benazepril Anaphylaxis   Shellfish Allergy Swelling   Vicodin [Hydrocodone-Acetaminophen] Other (See Comments)    Dizzy    No family history on file.   Prior to Admission medications   Medication Sig Start Date End Date Taking? Authorizing Provider  acetaminophen (TYLENOL) 650 MG CR tablet Take 1,300 mg by mouth every 8 (eight) hours as needed for pain.   Yes [provider]  allopurinol (ZYLOPRIM) 300 MG tablet Take 300 mg by mouth daily.    Yes [provider]  aluminum-magnesium hydroxide-simethicone (MAALOX) 200-200-20 MG/5ML SUSP Take 30 mLs by mouth 4 (four) times daily as needed (heart burn).   Yes [provider]  apixaban (ELIQUIS) 5 MG TABS tablet Take 5 mg by mouth 2 (two) times daily.  06/03/16  Yes [provider]  diltiazem (CARDIZEM CD) 240 MG 24 hr capsule Take 240 mg by mouth daily.  05/24/16  Yes [provider]  fluticasone (FLONASE) 50 MCG/ACT nasal spray Place 1 spray into both nostrils daily as needed for allergies or rhinitis.   Yes [provider]  lisinopril-hydrochlorothiazide (PRINZIDE,ZESTORETIC) 20-25 MG per tablet Take 1 tablet by mouth daily.   Yes [provider]  Magnesium Oxide  250 MG TABS Take 250 mg by mouth daily.    Yes [provider]  meclizine (ANTIVERT) 12.5 MG tablet Take 12.5 mg by mouth 3 (three) times daily as needed for dizziness or nausea.  03/12/15  Yes [provider]  naproxen sodium (ALEVE) 220 MG tablet Take 440 mg by mouth 2 (two) times daily as needed (pain).   Yes [provider]  omeprazole (PRILOSEC) 40 MG capsule Take 40 mg by mouth daily.  04/13/18  Yes [provider]  simvastatin (ZOCOR) 10 MG tablet Take 10 mg by mouth daily.  04/13/18  Yes [provider]    valACYclovir (VALTREX) 500 MG tablet Take 500 mg by mouth 2 (two) times daily as needed (outbreak).  03/28/18  Yes [provider]  zolpidem (AMBIEN) 5 MG tablet Take 5 mg by mouth at bedtime as needed for sleep.    Yes [provider]  glucose blood (ACCU-CHEK GUIDE) test strip USE TO CHECK BLOOD SUGAR TWICE A DAY 03/21/17   [provider]    Physical Exam: Vitals:   08/04/18 0330 08/04/18 0530 08/04/18 0552 08/04/18 0630  BP: 115/70 114/69  116/67  Pulse: (!) 102 93  91  Resp: (!) 24 (!) 22  (!) 21  Temp:   (!) 100.7 F (38.2 C)   TempSrc:   Oral   SpO2: 100% 100%  100%  Weight:      Height:        Constitutional: Not in acute distress, pleasant elderly gentleman Vitals:   08/04/18 0330 08/04/18 0530 08/04/18 0552 08/04/18 0630  BP: 115/70 114/69  116/67  Pulse: (!) 102 93  91  Resp: (!) 24 (!) 22  (!) 21  Temp:   (!) 100.7 F (38.2 C)   TempSrc:   Oral   SpO2: 100% 100%  100%  Weight:      Height:       Eyes: PERRL, lids and conjunctivae normal ENMT: Mucous membranes are moist. Posterior pharynx clear of any exudate or lesions.Normal dentition.  Neck: normal, supple, no masses, no thyromegaly Respiratory: clear to auscultation bilaterally, no wheezing, no crackles. Normal respiratory effort. No accessory muscle use.  Cardiovascular: Afib, no murmurs / rubs / gallops. No extremity edema. 2+ pedal pulses. No carotid bruits.  Abdomen: Tenderness on  right upper quadrant and right lower quadrant.  No hepatosplenomegaly. Bowel sounds positive.  Musculoskeletal: no clubbing / cyanosis. No joint deformity upper and lower extremities. Good ROM, no contractures. Normal muscle tone.  Skin: no rashes, lesions, ulcers. No induration Neurologic: CN 2-12 grossly intact. Sensation intact, DTR normal. Strength 5/5 in all 4.  Psychiatric: Normal judgment and insight. Alert and oriented x 3. Normal mood.   Foley Catheter:None  Labs on Admission: I have  personally reviewed following labs and imaging studies  CBC: Recent Labs  Lab 08/04/18 0033 08/04/18 0102  WBC 8.4  --   NEUTROABS 7.1  --   HGB 12.7* 13.3  HCT 38.9* 39.0  MCV 102.9*  --   PLT 178  --    Basic Metabolic Panel: Recent Labs  Lab 08/04/18 0033 08/04/18 0102  NA 137 138  K 4.0 4.8  CL 102  --   CO2 23  --   GLUCOSE 137*  --   BUN 23  --   CREATININE 1.65*  --   CALCIUM 9.6  --    GFR: Estimated Creatinine Clearance: 36.3 mL/min (A) (by C-G formula based on SCr of 1.65  mg/dL (H)). Liver Function Tests: Recent Labs  Lab 08/04/18 0033  AST 244*  ALT 132*  ALKPHOS 249*  BILITOT 2.8*  PROT 8.3*  ALBUMIN 3.6   Recent Labs  Lab 08/04/18 0033  LIPASE 31   No results for input(s): AMMONIA in the last 168 hours. Coagulation Profile: No results for input(s): INR, PROTIME in the last 168 hours. Cardiac Enzymes: No results for input(s): CKTOTAL, CKMB, CKMBINDEX, TROPONINI in the last 168 hours. BNP (last 3 results) No results for input(s): PROBNP in the last 8760 hours. HbA1C: No results for input(s): HGBA1C in the last 72 hours. CBG: No results for input(s): GLUCAP in the last 168 hours. Lipid Profile: No results for input(s): CHOL, HDL, LDLCALC, TRIG, CHOLHDL, LDLDIRECT in the last 72 hours. Thyroid Function Tests: No results for input(s): TSH, T4TOTAL, FREET4, T3FREE, THYROIDAB in the last 72 hours. Anemia Panel: No results for input(s): VITAMINB12, FOLATE, FERRITIN, TIBC, IRON, RETICCTPCT in the last 72 hours. Urine analysis:    Component Value Date/Time   COLORURINE ORANGE (A) 08/04/2018 0215   APPEARANCEUR CLEAR 08/04/2018 0215   LABSPEC 1.015 08/04/2018 0215   PHURINE 6.0 08/04/2018 0215   GLUCOSEU NEGATIVE 08/04/2018 0215   HGBUR NEGATIVE 08/04/2018 0215   BILIRUBINUR SMALL (A) 08/04/2018 0215   KETONESUR NEGATIVE 08/04/2018 0215   PROTEINUR NEGATIVE 08/04/2018 0215   NITRITE NEGATIVE 08/04/2018 0215   LEUKOCYTESUR NEGATIVE  08/04/2018 0215    Radiological Exams on Admission: Ct Abdomen Pelvis W Contrast  Result Date: 08/04/2018 CLINICAL DATA:  Diffuse abdominal pain.  Fever. EXAM: CT ABDOMEN AND PELVIS WITH CONTRAST TECHNIQUE: Multidetector CT imaging of the abdomen and pelvis was performed using the standard protocol following bolus administration of intravenous contrast. CONTRAST:  9mL OMNIPAQUE IOHEXOL 300 MG/ML  SOLN COMPARISON:  Report from abdominal CT 03/13/2014, images not available. FINDINGS: Lower chest: Linear atelectasis in both lower lobes. Pacemaker partially included. Hepatobiliary: Subcentimeter cyst in the anterior left lobe. Layering hyperdensity in the gallbladder may represent stones or sludge. Suggestion of mild gallbladder wall thickening at 4 mm. No biliary dilatation. Pancreas: Mild parenchymal atrophy. No ductal dilatation or inflammation. Spleen: Normal in size without focal abnormality. Adrenals/Urinary Tract: No adrenal nodule. No hydronephrosis or perinephric edema. Homogeneous renal enhancement with symmetric excretion on delayed phase imaging. Multiple low-density lesions in the right greater than left kidney are likely cyst, some of which are too small to characterize. Urinary bladder is physiologically distended without wall thickening. Stomach/Bowel: Prominent gastric distention with ingested material. There is normal positioning of the ligament of Treitz, however small bowel than courses in the right abdomen. No associated bowel wall thickening, inflammation, or obstruction. Colon is normally positioned. Appendix surgically absent per history. No pericecal inflammation. Multifocal colonic diverticulosis. No diverticulitis. Mild sigmoid colonic redundancy. Vascular/Lymphatic: Normal caliber abdominal aorta. Portal vein and mesenteric vessels are patent. No adenopathy. Reproductive: Prostate is unremarkable. Other: No free air, free fluid, or intra-abdominal fluid collection. Small fat containing  umbilical hernia Musculoskeletal: There are no acute or suspicious osseous abnormalities. Degenerative change in the spine. IMPRESSION: 1. Gallstones with possible gallbladder wall thickening. Recommend right upper quadrant ultrasound for further evaluation. 2. Marked gastric distention with ingested material, raising concern for gastroparesis or delayed gastric emptying. 3. Normal positioning of the ligament of Treitz, however proximal jejunum then courses abnormally into the right abdomen. It is unclear whether this is secondary to displacement related to distended stomach versus partial small bowel malrotation. No associated bowel inflammation or obstruction. 4. Colonic diverticulosis  without diverticulitis. Electronically Signed   By: Keith Rake M.D.   On: 08/04/2018 02:42   Dg Chest Port 1 View  Result Date: 08/04/2018 CLINICAL DATA:  Shortness of breath. Fever. Chills. 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 Unchanged heart size and mediastinal contours. No pulmonary edema, focal airspace disease, pleural effusion or pneumothorax. IMPRESSION: No acute chest finding. Electronically Signed   By: Keith Rake M.D.   On: 08/04/2018 01:33   US Abdomen Limited Ruq  Result Date: 08/04/2018 CLINICAL DATA:  Ascending cholangitis EXAM: ULTRASOUND ABDOMEN LIMITED RIGHT UPPER QUADRANT COMPARISON:  08/04/2018 CT abdomen/pelvis FINDINGS: Gallbladder: No gallbladder distention. Ring down artifact in the nondependent gallbladder wall indicative of adenomyomatosis. Layering subcentimeter shadowing gallstones. No sonographic Murphy sign. No pericholecystic fluid. Common bile duct: Diameter: 5 mm Liver: No focal lesion identified. Within normal limits in parenchymal echogenicity. Portal vein is patent on color Doppler imaging with normal direction of blood flow towards the liver. IMPRESSION: 1. Cholelithiasis.  No sonographic  evidence of acute cholecystitis. 2. Findings of adenomyomatosis of the gallbladder. 3. No biliary ductal dilatation. 4. Normal liver. Electronically Signed   By: Ilona Sorrel M.D.   On: 08/04/2018 05:25     Assessment/Plan Principal Problem:   Acute cholecystitis Active Problems:   Acute abdominal pain   AKI (acute kidney injury) ([place])   Elevated liver enzymes      Acute cholecystitis: CT abdomen/pelvis showed gallstones with possible gallbladder wall thickening.  Also showed possible gastroparesis. Presented with right-sided abdominal pain.  No nausea vomiting or diarrhea.  Also had fever, chills at home.  Clinical picture is suggestive of acute cholecystitis.  General surgery consulted.  Keep n.p.o. for now.  Started on Zosyn. General surgery planning for cholecystectomy likely on Monday as he was on Eliquis at home and the last dose was yesterday evening. Continue pain management.  Acute kidney injury: Patient denies any history of CKD.  His creatinine on 05/2013 was 2.9 but probably that was just a transient episode.  Continue IV fluids.  Check BMP tomorrow.  Elevated liver enzymes: Secondary to acute cholecystitis.AST more than ALT.Denies heavy alcohol abuse, is a social drinker.  Mildly elevated bilirubin.  Normal CBD as per ultrasound.  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  Paroxysmal A. fib: Currently heart rate is controlled.  He follows with electrophysiologist Dr. Minna Merritts at [facility].  On Eliquis for anticoagulation at home.  Eliquis will be held for possible surgical intervention.  Started on heparin drip.  On Cardizem for rate control.  Hypertension: On lisinopril-hydrochlorothiazide at home.  Currently blood pressure stable.  Antihypertensives on hold due to acute kidney injury.  Continue PRN meds as needed.  Gout: On allopurinol at home.  Diabetes mellitus: Not on medication at home.  On dietary modification.  Hyperlipidemia: On statin at home.  Will  hold because of elevated liver enzymes.     Severity of Illness: The appropriate patient status for this patient is INPATIENT. \ Most likely he will need surgery in this hospitalization.  Also needs IV antibiotics.  DVT prophylaxis: Heparin IV Code Status: Full Family Communication: Discussed with wife on phone  consults called: General surgery     Shelly Coss MD [facility] Pager [phone]  If 7PM-7AM, please contact night-coverage [email or web address] Password [facility]  08/04/2018, 7:33 AM

## 2018-08-04 NOTE — ED Notes (Signed)
X-ray at bedside

## 2018-08-04 NOTE — ED Notes (Signed)
Patient transported to CT 

## 2018-08-04 NOTE — ED Triage Notes (Signed)
Pt reports generalized abdominal pain, fevers/chills, and SOB x 3 days. Pt reports the pain has increased over the last few hours. Pt admits to ETOH use today

## 2018-08-04 NOTE — ED Notes (Signed)
RN went out and spoke with patients spouse and updated her on the plan of care. Will continue to monitor

## 2018-08-04 NOTE — ED Provider Notes (Signed)
[facility] Provider Note: Georgena Spurling, MD, FACEP  CSN: [id number] MRN: [id number] ARRIVAL: 08/04/18 at [place]: [place]  Abdominal Pain   HISTORY OF PRESENT ILLNESS  08/04/18 12:34 AM Derek Curry is a 78 y.o. male with a 3-day history of abdominal pain.  The pain is diffuse but most prominent in the right lower quadrant.  The pain worsened over the past several hours.  He describes it as a dull pain and rates it as an 8 out of 10.  It is worse with movement or palpation.  He denies associated nausea, vomiting or diarrhea.  He had a normal bowel movement prior to arrival.  He has also been short of breath, notably when lying supine.  When sitting upright he does not notice being short of breath. He was noted to be tachypneic on arrival.  He was also noted to be febrile with a temperature of 102.7 rectally on arrival.  His oxygen saturation was in the low 90s and he was placed on oxygen by respiratory therapy prior to my evaluation.   Past Medical History:  Diagnosis Date   Diabetes mellitus    GERD (gastroesophageal reflux disease)    Gout    Hypertension    Prostatitis     Past Surgical History:  Procedure Laterality Date   PACEMAKER INSERTION      No family history on file.  Social History   Tobacco Use   Smoking status: Never Smoker  Substance Use Topics   Alcohol use: No   Drug use: No    Prior to Admission medications   Medication Sig Start Date End Date Taking? Authorizing Provider  allopurinol (ZYLOPRIM) 300 MG tablet Take 1 tablet by mouth daily.    [provider]  apixaban (ELIQUIS) 5 MG TABS tablet TAKE 1 TABLET TWICE DAILY 06/03/16   [provider]  diltiazem (CARDIZEM CD) 240 MG 24 hr capsule Take 1 capsule by mouth daily. 05/24/16   [provider]  fluticasone (FLONASE) 50 MCG/ACT nasal spray Place 2 sprays into both nostrils 2 (two) times daily as needed. 01/15/18   [provider]  glucose blood (ACCU-CHEK GUIDE) test strip USE TO CHECK BLOOD SUGAR TWICE A DAY 03/21/17   [provider]  lisinopril-hydrochlorothiazide (PRINZIDE,ZESTORETIC) 20-25 MG per tablet Take 1 tablet by mouth daily.    [provider]  Magnesium Oxide 250 MG TABS Take 1 tablet by mouth daily.    [provider]  meclizine (ANTIVERT) 12.5 MG tablet Take 1 tablet by mouth 3 (three) times daily as needed. 03/12/15   [provider]  omeprazole (PRILOSEC) 40 MG capsule Take 1 capsule by mouth daily. 04/13/18   [provider]  simvastatin (ZOCOR) 10 MG tablet Take 1 tablet by mouth daily. 04/13/18   [provider]  triamcinolone cream (KENALOG) 0.1 % Apply to affected area as needed twice daily 01/15/18   [provider]  valACYclovir (VALTREX) 500 MG tablet TAKE 1 T PO BID PRN FOR ONE WEEK 03/28/18   [provider]  zolpidem (AMBIEN) 5 MG tablet Take 1 tablet by mouth at bedtime as needed.    [provider]    Allergies Benazepril, Shellfish allergy, and Vicodin [hydrocodone-acetaminophen]   REVIEW OF SYSTEMS  Negative except as noted here or in the History of Present Illness.   PHYSICAL EXAMINATION  Initial Vital Signs Blood pressure (!) 153/111, pulse (!) 105, temperature (!) 100.4 F (38 C),  temperature source Oral, resp. rate (!) 26, height 5\' 8"  (1.727 m), weight 78.5 kg, SpO2 94 %.  Examination General: Well-developed, well-nourished male in no acute distress; appearance consistent with age of record HENT: normocephalic; atraumatic Eyes: Normal appearance Neck: supple Heart: regular rate and rhythm; tachycardia; frequent ectopy Lungs: clear to auscultation bilaterally; tachypnea Abdomen: soft; nondistended; diffusely tender; bowel sounds hypoactive Extremities: No deformity; full range of motion; pulses normal Neurologic: Awake, alert and oriented; motor function intact in all extremities and  symmetric; no facial droop Skin: Warm and dry Psychiatric: Normal mood and affect   RESULTS  Summary of this visit's results, reviewed by myself:   EKG Interpretation  Date/Time:    Ventricular Rate:    PR Interval:    QRS Duration:   QT Interval:    QTC Calculation:   R Axis:     Text Interpretation:        Laboratory Studies: Results for orders placed or performed during the hospital encounter of 08/04/18 (from the past 24 hour(s))  Comprehensive metabolic panel     Status: Abnormal   Collection Time: 08/04/18 12:33 AM  Result Value Ref Range   Sodium 137 135 - 145 mmol/L   Potassium 4.0 3.5 - 5.1 mmol/L   Chloride 102 98 - 111 mmol/L   CO2 23 22 - 32 mmol/L   Glucose, Bld 137 (H) 70 - 99 mg/dL   BUN 23 8 - 23 mg/dL   Creatinine, Ser 1.65 (H) 0.61 - 1.24 mg/dL   Calcium 9.6 8.9 - 10.3 mg/dL   Total Protein 8.3 (H) 6.5 - 8.1 g/dL   Albumin 3.6 3.5 - 5.0 g/dL   AST 244 (H) 15 - 41 U/L   ALT 132 (H) 0 - 44 U/L   Alkaline Phosphatase 249 (H) 38 - 126 U/L   Total Bilirubin 2.8 (H) 0.3 - 1.2 mg/dL   GFR calc non Af Amer 39 (L) >60 mL/min   GFR calc Af Amer 46 (L) >60 mL/min   Anion gap 12 5 - 15  CBC with Differential/Platelet     Status: Abnormal   Collection Time: 08/04/18 12:33 AM  Result Value Ref Range   WBC 8.4 4.0 - 10.5 K/uL   RBC 3.78 (L) 4.22 - 5.81 MIL/uL   Hemoglobin 12.7 (L) 13.0 - 17.0 g/dL   HCT 38.9 (L) 39.0 - 52.0 %   MCV 102.9 (H) 80.0 - 100.0 fL   MCH 33.6 26.0 - 34.0 pg   MCHC 32.6 30.0 - 36.0 g/dL   RDW 13.8 11.5 - 15.5 %   Platelets 178 150 - 400 K/uL   nRBC 0.0 0.0 - 0.2 %   Neutrophils Relative % 85 %   Neutro Abs 7.1 1.7 - 7.7 K/uL   Lymphocytes Relative 13 %   Lymphs Abs 1.1 0.7 - 4.0 K/uL   Monocytes Relative 2 %   Monocytes Absolute 0.2 0.1 - 1.0 K/uL   Eosinophils Relative 0 %   Eosinophils Absolute 0.0 0.0 - 0.5 K/uL   Basophils Relative 0 %   Basophils Absolute 0.0 0.0 - 0.1 K/uL   Immature Granulocytes 0 %   Abs Immature  Granulocytes 0.03 0.00 - 0.07 K/uL  Lipase, blood     Status: None   Collection Time: 08/04/18 12:33 AM  Result Value Ref Range   Lipase 31 11 - 51 U/L  SARS Coronavirus 2 (Hosp order,Performed in [place] lab via [employer] [facility])     Status: None  Collection Time: 08/04/18 12:33 AM   Specimen: Dry Nasal Swab ([facility] Now)  Result Value Ref Range   SARS Coronavirus 2 ([facility] Now) NEGATIVE NEGATIVE  Lactic acid, plasma     Status: None   Collection Time: 08/04/18 12:37 AM  Result Value Ref Range   Lactic Acid, Venous 1.7 0.5 - 1.9 mmol/L  Ethanol     Status: None   Collection Time: 08/04/18 12:42 AM  Result Value Ref Range   Alcohol, Ethyl (B) <10 <10 mg/dL  POCT I-Stat EG7     Status: Abnormal   Collection Time: 08/04/18  1:02 AM  Result Value Ref Range   pH, Ven 7.354 7.250 - 7.430   pCO2, Ven 53.8 44.0 - 60.0 mmHg   pO2, Ven 21.0 (LL) 32.0 - 45.0 mmHg   Bicarbonate 29.4 (H) 20.0 - 28.0 mmol/L   TCO2 31 22 - 32 mmol/L   O2 Saturation 25.0 %   Acid-Base Excess 3.0 (H) 0.0 - 2.0 mmol/L   Sodium 138 135 - 145 mmol/L   Potassium 4.8 3.5 - 5.1 mmol/L   Calcium, Ion 1.23 1.15 - 1.40 mmol/L   HCT 39.0 39.0 - 52.0 %   Hemoglobin 13.3 13.0 - 17.0 g/dL   Patient temperature 102.7 F    Sample type VENOUS    Comment NOTIFIED PHYSICIAN   Urinalysis, Routine w reflex microscopic     Status: Abnormal   Collection Time: 08/04/18  2:15 AM  Result Value Ref Range   Color, Urine ORANGE (A) YELLOW   APPearance CLEAR CLEAR   Specific Gravity, Urine 1.015 1.005 - 1.030   pH 6.0 5.0 - 8.0   Glucose, UA NEGATIVE NEGATIVE mg/dL   Hgb urine dipstick NEGATIVE NEGATIVE   Bilirubin Urine SMALL (A) NEGATIVE   Ketones, ur NEGATIVE NEGATIVE mg/dL   Protein, ur NEGATIVE NEGATIVE mg/dL   Nitrite NEGATIVE NEGATIVE   Leukocytes,Ua NEGATIVE NEGATIVE   Imaging Studies: Ct Abdomen Pelvis W Contrast  Result Date: 08/04/2018 CLINICAL DATA:  Diffuse abdominal pain.  Fever. EXAM: CT ABDOMEN AND PELVIS  WITH CONTRAST TECHNIQUE: Multidetector CT imaging of the abdomen and pelvis was performed using the standard protocol following bolus administration of intravenous contrast. CONTRAST:  51mL OMNIPAQUE IOHEXOL 300 MG/ML  SOLN COMPARISON:  Report from abdominal CT 03/13/2014, images not available. FINDINGS: Lower chest: Linear atelectasis in both lower lobes. Pacemaker partially included. Hepatobiliary: Subcentimeter cyst in the anterior left lobe. Layering hyperdensity in the gallbladder may represent stones or sludge. Suggestion of mild gallbladder wall thickening at 4 mm. No biliary dilatation. Pancreas: Mild parenchymal atrophy. No ductal dilatation or inflammation. Spleen: Normal in size without focal abnormality. Adrenals/Urinary Tract: No adrenal nodule. No hydronephrosis or perinephric edema. Homogeneous renal enhancement with symmetric excretion on delayed phase imaging. Multiple low-density lesions in the right greater than left kidney are likely cyst, some of which are too small to characterize. Urinary bladder is physiologically distended without wall thickening. Stomach/Bowel: Prominent gastric distention with ingested material. There is normal positioning of the ligament of Treitz, however small bowel than courses in the right abdomen. No associated bowel wall thickening, inflammation, or obstruction. Colon is normally positioned. Appendix surgically absent per history. No pericecal inflammation. Multifocal colonic diverticulosis. No diverticulitis. Mild sigmoid colonic redundancy. Vascular/Lymphatic: Normal caliber abdominal aorta. Portal vein and mesenteric vessels are patent. No adenopathy. Reproductive: Prostate is unremarkable. Other: No free air, free fluid, or intra-abdominal fluid collection. Small fat containing umbilical hernia Musculoskeletal: There are no acute or suspicious  osseous abnormalities. Degenerative change in the spine. IMPRESSION: 1. Gallstones with possible gallbladder wall  thickening. Recommend right upper quadrant ultrasound for further evaluation. 2. Marked gastric distention with ingested material, raising concern for gastroparesis or delayed gastric emptying. 3. Normal positioning of the ligament of Treitz, however proximal jejunum then courses abnormally into the right abdomen. It is unclear whether this is secondary to displacement related to distended stomach versus partial small bowel malrotation. No associated bowel inflammation or obstruction. 4. Colonic diverticulosis without diverticulitis. Electronically Signed   By: Keith Rake M.D.   On: 08/04/2018 02:42   Dg Chest Port 1 View  Result Date: 08/04/2018 CLINICAL DATA:  Shortness of breath. Fever. Chills. 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 Unchanged heart size and mediastinal contours. No pulmonary edema, focal airspace disease, pleural effusion or pneumothorax. IMPRESSION: No acute chest finding. Electronically Signed   By: Keith Rake M.D.   On: 08/04/2018 01:33    ED COURSE and MDM  Nursing notes and initial vitals signs, including pulse oximetry, reviewed.  Vitals:   08/04/18 0130 08/04/18 0145 08/04/18 0300 08/04/18 0312  BP: (!) 139/95  122/67   Pulse: 98 99 (!) 101   Resp: (!) 24 (!) 22 19   Temp:    (!) 102 F (38.9 C)  TempSrc:    Rectal  SpO2: 100% 100% 100%   Weight:      Height:       2:53 AM CT scan suggestive of acute cholecystitis.  This, with fever and elevated LFTs is concerning.  We will have him transported to the [facility] long emergency department for ultrasound.  Dr. Dayna Barker accepting physician.  PROCEDURES    ED DIAGNOSES     ICD-10-CM   1. Generalized abdominal pain  R10.84   2. Gallstones  K80.20   3. Fever in adult  R50.9   4. Elevated liver function tests  R94.5        Divya Munshi, Jenny Reichmann, MD 08/04/18 [phone]

## 2018-08-04 NOTE — Progress Notes (Signed)
Pharmacy Antibiotic Note  Derek Curry is a 78 y.o. male admitted on 08/04/2018 with intra-abdominal infection.  Pharmacy has been consulted for piperacillin/tazobactam dosing.  Pt presenting with abdominal pain, CT suggestive of acute cholecystitis. Antibiotics being initiated for intra-abdominal infection.  Today, 08/04/18  Wbc 8.4 - WNL  Lactate 1.7  SCr 1.65, CrCl ~36 mL/min  Plan:  Piperacillin/tazobactam 3.375 g IV q8h EI  Height: 5\' 8"  (172.7 cm) Weight: 173 lb (78.5 kg) IBW/kg (Calculated) : 68.4  Temp (24hrs), Avg:101.1 F (38.4 C), Min:99.7 F (37.6 C), Max:102.7 F (39.3 C)  Recent Labs  Lab 08/04/18 0033 08/04/18 0037  WBC 8.4  --   CREATININE 1.65*  --   LATICACIDVEN  --  1.7    Estimated Creatinine Clearance: 36.3 mL/min (A) (by C-G formula based on SCr of 1.65 mg/dL (H)).    Allergies  Allergen Reactions  . Benazepril Anaphylaxis  . Shellfish Allergy Swelling  . Vicodin [Hydrocodone-Acetaminophen] Other (See Comments)    Dizzy    Antimicrobials this admission: Piperacillin/tazobactam 6/20 >>   Dose adjustments this admission:  Microbiology results: 6/20 BCx: Sent 6/20 COVID: Negative  Thank you for allowing pharmacy to be a part of this patient's care.  Lenis Noon, PharmD 08/04/2018 7:50 AM

## 2018-08-04 NOTE — ED Notes (Signed)
ED TO INPATIENT HANDOFF REPORT  ED Nurse Name and Phone #: Raquel Sarna, RN  S Name/Age/Gender Derek Curry 78 y.o. male Room/Bed: WA24/WA24  Code Status   Code Status: Not on file  Home/SNF/Other Home Patient oriented to: self, place, time and situation Is this baseline? Yes   Triage Complete: Triage complete  Chief Complaint ABD PAIN  Triage Note Pt reports generalized abdominal pain, fevers/chills, and SOB x 3 days. Pt reports the pain has increased over the last few hours. Pt admits to ETOH use today   Allergies Allergies  Allergen Reactions  . Benazepril Anaphylaxis  . Shellfish Allergy Swelling  . Vicodin [Hydrocodone-Acetaminophen] Other (See Comments)    Dizzy    Level of Care/Admitting Diagnosis ED Disposition    ED Disposition Condition [facility] Area: [place] [100102]  Level of Care: Med-Surg [16]  Covid Evaluation: Screening Protocol (No Symptoms)  Diagnosis: Cholelithiasis with acute cholangitis [3007622]  Admitting Physician: Vianne Bulls [[id number]]  Attending Physician: Vianne Bulls [[id number]]  PT Class (Do Not Modify): Observation [104]  PT Acc Code (Do Not Modify): Observation [10022]       B Medical/Surgery History Past Medical History:  Diagnosis Date  . Diabetes mellitus   . GERD (gastroesophageal reflux disease)   . Gout   . Hypertension   . Prostatitis    Past Surgical History:  Procedure Laterality Date  . PACEMAKER INSERTION       A IV Location/Drains/Wounds Patient Lines/Drains/Airways Status   Active Line/Drains/Airways    Name:   Placement date:   Placement time:   Site:   Days:   Peripheral IV 08/04/18 Right Antecubital   08/04/18    0048    Antecubital   less than 1   Peripheral IV 08/04/18 Left Antecubital   08/04/18    0101    Antecubital   less than 1          Intake/Output Last 24 hours  Intake/Output Summary (Last 24 hours) at 08/04/2018 0645 Last data filed at  08/04/2018 3734 Gross per 24 hour  Intake 1040.59 ml  Output -  Net 1040.59 ml    Labs/Imaging Results for orders placed or performed during the hospital encounter of 08/04/18 (from the past 48 hour(s))  Comprehensive metabolic panel     Status: Abnormal   Collection Time: 08/04/18 12:33 AM  Result Value Ref Range   Sodium 137 135 - 145 mmol/L   Potassium 4.0 3.5 - 5.1 mmol/L    Comment: SLIGHT HEMOLYSIS   Chloride 102 98 - 111 mmol/L   CO2 23 22 - 32 mmol/L   Glucose, Bld 137 (H) 70 - 99 mg/dL   BUN 23 8 - 23 mg/dL   Creatinine, Ser 1.65 (H) 0.61 - 1.24 mg/dL   Calcium 9.6 8.9 - 10.3 mg/dL   Total Protein 8.3 (H) 6.5 - 8.1 g/dL   Albumin 3.6 3.5 - 5.0 g/dL   AST 244 (H) 15 - 41 U/L   ALT 132 (H) 0 - 44 U/L   Alkaline Phosphatase 249 (H) 38 - 126 U/L   Total Bilirubin 2.8 (H) 0.3 - 1.2 mg/dL   GFR calc non Af Amer 39 (L) >60 mL/min   GFR calc Af Amer 46 (L) >60 mL/min   Anion gap 12 5 - 15    Comment: Performed at [facility], [address] [place], [place] [address]  CBC with Differential/Platelet  Status: Abnormal   Collection Time: 08/04/18 12:33 AM  Result Value Ref Range   WBC 8.4 4.0 - 10.5 K/uL   RBC 3.78 (L) 4.22 - 5.81 MIL/uL   Hemoglobin 12.7 (L) 13.0 - 17.0 g/dL   HCT 38.9 (L) 39.0 - 52.0 %   MCV 102.9 (H) 80.0 - 100.0 fL   MCH 33.6 26.0 - 34.0 pg   MCHC 32.6 30.0 - 36.0 g/dL   RDW 13.8 11.5 - 15.5 %   Platelets 178 150 - 400 K/uL   nRBC 0.0 0.0 - 0.2 %   Neutrophils Relative % 85 %   Neutro Abs 7.1 1.7 - 7.7 K/uL   Lymphocytes Relative 13 %   Lymphs Abs 1.1 0.7 - 4.0 K/uL   Monocytes Relative 2 %   Monocytes Absolute 0.2 0.1 - 1.0 K/uL   Eosinophils Relative 0 %   Eosinophils Absolute 0.0 0.0 - 0.5 K/uL   Basophils Relative 0 %   Basophils Absolute 0.0 0.0 - 0.1 K/uL   Immature Granulocytes 0 %   Abs Immature Granulocytes 0.03 0.00 - 0.07 K/uL    Comment: Performed at [facility], [address] [place], [place]  [address]  Lipase, blood     Status: None   Collection Time: 08/04/18 12:33 AM  Result Value Ref Range   Lipase 31 11 - 51 U/L    Comment: Performed at [facility], [address] [place], [place] [address]  SARS Coronavirus 2 (Hosp order,Performed in [place] lab via [employer])     Status: None   Collection Time: 08/04/18 12:33 AM   Specimen: Dry Nasal Swab ([employer] Now)  Result Value Ref Range   SARS Coronavirus 2 ([employer] Now) NEGATIVE NEGATIVE    Comment: (NOTE) SARS-CoV-2 target nucleic acids are NOT DETECTED. The SARS-CoV-2 RNA is generally detectable in upper and lower respiratory specimens during the acute phase of infection.  Negativeresults do not preclude SARS-CoV-2 infection, do not rule out coinfections with other pathogens, and should not be used as the  sole basis for treatment or other patient management decisions.  Negative results must be combined with clinical observations, patient history, and epidemiological information. The expected result is Negative. Fact Sheet for Patients: GolfingFamily.no Fact Sheet for Healthcare Providers: https://www.hernandez-brewer.com/ This test is not yet approved or cleared by the [place] FDA and  has been authorized for detection and/or diagnosis of SARS-CoV-2 by FDA under an Emergency Use Authorization (EUA).  This EUA will remain in effect (meaning this test can be used) for the duration of  the COVID19 declaration under Section 5 64(b)(1) of the Act, 21 U.S.C.  section [phone] 3(b)(1), unless the authorization is terminated or revoked sooner. Performed at [facility], [address] [place], [place] [address]   Lactic acid, plasma     Status: None   Collection Time: 08/04/18 12:37 AM  Result Value Ref Range   Lactic Acid, Venous 1.7 0.5 - 1.9 mmol/L    Comment: Performed at [facility], [address] [place], [place] [address]  Ethanol      Status: None   Collection Time: 08/04/18 12:42 AM  Result Value Ref Range   Alcohol, Ethyl (B) <10 <10 mg/dL    Comment: (NOTE) Lowest detectable limit for serum alcohol is 10 mg/dL. For medical purposes only. Performed at [facility], [address] [place], [place] [address]  POCT I-Stat EG7     Status: Abnormal   Collection Time: 08/04/18  1:02 AM  Result Value Ref Range   pH, Ven 7.354 7.250 - 7.430   pCO2, Ven 53.8 44.0 - 60.0 mmHg   pO2, Ven 21.0 (LL) 32.0 - 45.0 mmHg   Bicarbonate 29.4 (H) 20.0 - 28.0 mmol/L   TCO2 31 22 - 32 mmol/L   O2 Saturation 25.0 %   Acid-Base Excess 3.0 (H) 0.0 - 2.0 mmol/L   Sodium 138 135 - 145 mmol/L   Potassium 4.8 3.5 - 5.1 mmol/L   Calcium, Ion 1.23 1.15 - 1.40 mmol/L   HCT 39.0 39.0 - 52.0 %   Hemoglobin 13.3 13.0 - 17.0 g/dL   Patient temperature 102.7 F    Sample type VENOUS    Comment NOTIFIED PHYSICIAN   Urinalysis, Routine w reflex microscopic     Status: Abnormal   Collection Time: 08/04/18  2:15 AM  Result Value Ref Range   Color, Urine ORANGE (A) YELLOW    Comment: BIOCHEMICALS MAY BE AFFECTED BY COLOR   APPearance CLEAR CLEAR   Specific Gravity, Urine 1.015 1.005 - 1.030   pH 6.0 5.0 - 8.0   Glucose, UA NEGATIVE NEGATIVE mg/dL   Hgb urine dipstick NEGATIVE NEGATIVE   Bilirubin Urine SMALL (A) NEGATIVE   Ketones, ur NEGATIVE NEGATIVE mg/dL   Protein, ur NEGATIVE NEGATIVE mg/dL   Nitrite NEGATIVE NEGATIVE   Leukocytes,Ua NEGATIVE NEGATIVE    Comment: Microscopic not done on urines with negative protein, blood, leukocytes, nitrite, or glucose < 500 mg/dL. Performed at [facility], [address] [place], [place] [address]    Ct Abdomen Pelvis W Contrast  Result Date: 08/04/2018 CLINICAL DATA:  Diffuse abdominal pain.  Fever. EXAM: CT ABDOMEN AND PELVIS WITH CONTRAST TECHNIQUE: Multidetector CT imaging of the abdomen and pelvis was performed using the standard protocol following bolus  administration of intravenous contrast. CONTRAST:  59mL OMNIPAQUE IOHEXOL 300 MG/ML  SOLN COMPARISON:  Report from abdominal CT 03/13/2014, images not available. FINDINGS: Lower chest: Linear atelectasis in both lower lobes. Pacemaker partially included. Hepatobiliary: Subcentimeter cyst in the anterior left lobe. Layering hyperdensity in the gallbladder may represent stones or sludge. Suggestion of mild gallbladder wall thickening at 4 mm. No biliary dilatation. Pancreas: Mild parenchymal atrophy. No ductal dilatation or inflammation. Spleen: Normal in size without focal abnormality. Adrenals/Urinary Tract: No adrenal nodule. No hydronephrosis or perinephric edema. Homogeneous renal enhancement with symmetric excretion on delayed phase imaging. Multiple low-density lesions in the right greater than left kidney are likely cyst, some of which are too small to characterize. Urinary bladder is physiologically distended without wall thickening. Stomach/Bowel: Prominent gastric distention with ingested material. There is normal positioning of the ligament of Treitz, however small bowel than courses in the right abdomen. No associated bowel wall thickening, inflammation, or obstruction. Colon is normally positioned. Appendix surgically absent per history. No pericecal inflammation. Multifocal colonic diverticulosis. No diverticulitis. Mild sigmoid colonic redundancy. Vascular/Lymphatic: Normal caliber abdominal aorta. Portal vein and mesenteric vessels are patent. No adenopathy. Reproductive: Prostate is unremarkable. Other: No free air, free fluid, or intra-abdominal fluid collection. Small fat containing umbilical hernia Musculoskeletal: There are no acute or suspicious osseous abnormalities. Degenerative change in the spine. IMPRESSION: 1. Gallstones with possible gallbladder wall thickening. Recommend right upper quadrant ultrasound for further evaluation. 2. Marked gastric distention with ingested material, raising  concern for gastroparesis or delayed gastric emptying. 3. Normal positioning of the ligament of Treitz,  however proximal jejunum then courses abnormally into the right abdomen. It is unclear whether this is secondary to displacement related to distended stomach versus partial small bowel malrotation. No associated bowel inflammation or obstruction. 4. Colonic diverticulosis without diverticulitis. Electronically Signed   By: Keith Rake M.D.   On: 08/04/2018 02:42   Dg Chest Port 1 View  Result Date: 08/04/2018 CLINICAL DATA:  Shortness of breath. Fever. Chills. 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 Unchanged heart size and mediastinal contours. No pulmonary edema, focal airspace disease, pleural effusion or pneumothorax. IMPRESSION: No acute chest finding. Electronically Signed   By: Keith Rake M.D.   On: 08/04/2018 01:33   US Abdomen Limited Ruq  Result Date: 08/04/2018 CLINICAL DATA:  Ascending cholangitis EXAM: ULTRASOUND ABDOMEN LIMITED RIGHT UPPER QUADRANT COMPARISON:  08/04/2018 CT abdomen/pelvis FINDINGS: Gallbladder: No gallbladder distention. Ring down artifact in the nondependent gallbladder wall indicative of adenomyomatosis. Layering subcentimeter shadowing gallstones. No sonographic Murphy sign. No pericholecystic fluid. Common bile duct: Diameter: 5 mm Liver: No focal lesion identified. Within normal limits in parenchymal echogenicity. Portal vein is patent on color Doppler imaging with normal direction of blood flow towards the liver. IMPRESSION: 1. Cholelithiasis.  No sonographic evidence of acute cholecystitis. 2. Findings of adenomyomatosis of the gallbladder. 3. No biliary ductal dilatation. 4. Normal liver. Electronically Signed   By: Ilona Sorrel M.D.   On: 08/04/2018 05:25    Pending Labs Unresulted Labs (From admission, onward)    Start     Ordered   08/04/18 0551  SARS Coronavirus 2  (CEPHEID - Performed in [place] hospital lab), Hosp Order  (Asymptomatic Patients Labs)  Once,   STAT    Question:  Rule Out  Answer:  Yes   08/04/18 0550   08/04/18 0037  Culture, blood (Routine X 2) w Reflex to ID Panel  BLOOD CULTURE X 2,   STAT     08/04/18 0036          Vitals/Pain Today's Vitals   08/04/18 0530 08/04/18 0548 08/04/18 0552 08/04/18 0630  BP: 114/69   116/67  Pulse: 93   91  Resp: (!) 22   (!) 21  Temp:   (!) 100.7 F (38.2 C)   TempSrc:   Oral   SpO2: 100%   100%  Weight:      Height:      PainSc:  5       Isolation Precautions Droplet and Contact precautions  Medications Medications  fentaNYL (SUBLIMAZE) injection 25-50 mcg (has no administration in time range)  ondansetron (ZOFRAN) injection 4 mg (has no administration in time range)  acetaminophen (TYLENOL) tablet 650 mg (650 mg Oral Given 08/04/18 0140)  sodium chloride 0.9 % bolus 1,000 mL (0 mLs Intravenous Stopped 08/04/18 0314)  iohexol (OMNIPAQUE) 300 MG/ML solution 100 mL (80 mLs Intravenous Contrast Given 08/04/18 0211)  ondansetron (ZOFRAN) injection 4 mg (4 mg Intravenous Given 08/04/18 0339)  fentaNYL (SUBLIMAZE) injection 50 mcg (50 mcg Intravenous Given 08/04/18 0340)  lactated ringers bolus 1,000 mL (1,000 mLs Intravenous New Bag/Given 08/04/18 0547)  piperacillin-tazobactam (ZOSYN) IVPB 3.375 g ( Intravenous Stopped 08/04/18 8756)    Mobility walks Low fall risk      R Recommendations: See Admitting Provider Note  Report given to: Jillyn Ledger, RN

## 2018-08-04 NOTE — ED Notes (Signed)
Gave patient's wallet to spouse Silva Bandy in car.

## 2018-08-04 NOTE — ED Notes (Signed)
Called [facility] - s/w Maudie Mercury - advised that we need pt to be transported to [facility] ED, Dx Abd Pain, r/o Cholecystitis.  Advised Neg Covid19.

## 2018-08-04 NOTE — ED Notes (Signed)
[facility] report given to [employer].

## 2018-08-05 ENCOUNTER — Encounter (HOSPITAL_COMMUNITY): Payer: Self-pay | Admitting: *Deleted

## 2018-08-05 DIAGNOSIS — R7881 Bacteremia: Secondary | ICD-10-CM

## 2018-08-05 DIAGNOSIS — K831 Obstruction of bile duct: Secondary | ICD-10-CM

## 2018-08-05 DIAGNOSIS — D539 Nutritional anemia, unspecified: Secondary | ICD-10-CM

## 2018-08-05 DIAGNOSIS — I480 Paroxysmal atrial fibrillation: Secondary | ICD-10-CM

## 2018-08-05 DIAGNOSIS — K8020 Calculus of gallbladder without cholecystitis without obstruction: Secondary | ICD-10-CM

## 2018-08-05 DIAGNOSIS — R1011 Right upper quadrant pain: Secondary | ICD-10-CM

## 2018-08-05 DIAGNOSIS — N179 Acute kidney failure, unspecified: Secondary | ICD-10-CM

## 2018-08-05 DIAGNOSIS — R748 Abnormal levels of other serum enzymes: Secondary | ICD-10-CM

## 2018-08-05 DIAGNOSIS — R109 Unspecified abdominal pain: Secondary | ICD-10-CM

## 2018-08-05 LAB — BLOOD CULTURE ID PANEL (REFLEXED)

## 2018-08-05 LAB — COMPREHENSIVE METABOLIC PANEL
ALT: 102 U/L — ABNORMAL HIGH (ref 0–44)
AST: 81 U/L — ABNORMAL HIGH (ref 15–41)
Albumin: 2.7 g/dL — ABNORMAL LOW (ref 3.5–5.0)
Alkaline Phosphatase: 162 U/L — ABNORMAL HIGH (ref 38–126)
Anion gap: 9 (ref 5–15)
BUN: 19 mg/dL (ref 8–23)
CO2: 22 mmol/L (ref 22–32)
Calcium: 8.4 mg/dL — ABNORMAL LOW (ref 8.9–10.3)
Chloride: 108 mmol/L (ref 98–111)
Creatinine, Ser: 1.3 mg/dL — ABNORMAL HIGH (ref 0.61–1.24)
GFR calc Af Amer: >60 mL/min (ref >60)
GFR calc non Af Amer: 53 mL/min — ABNORMAL LOW (ref >60)
Glucose, Bld: 116 mg/dL — ABNORMAL HIGH (ref 70–99)
Potassium: 3.5 mmol/L (ref 3.5–5.1)
Sodium: 139 mmol/L (ref 135–145)
Total Bilirubin: 3 mg/dL — ABNORMAL HIGH (ref 0.3–1.2)
Total Protein: 6.2 g/dL — ABNORMAL LOW (ref 6.5–8.1)

## 2018-08-05 LAB — APTT
aPTT: 75 seconds — ABNORMAL HIGH (ref 24–36)
aPTT: 52 seconds — ABNORMAL HIGH (ref 24–36)
aPTT: 59 seconds — ABNORMAL HIGH (ref 24–36)

## 2018-08-05 LAB — CBC
HCT: 33.1 % — ABNORMAL LOW (ref 39.0–52.0)
Hemoglobin: 10.8 g/dL — ABNORMAL LOW (ref 13.0–17.0)
MCH: 34 pg (ref 26.0–34.0)
MCHC: 32.6 g/dL (ref 30.0–36.0)
MCV: 104.1 fL — ABNORMAL HIGH (ref 80.0–100.0)
Platelets: 138 10*3/uL — ABNORMAL LOW (ref 150–400)
RBC: 3.18 MIL/uL — ABNORMAL LOW (ref 4.22–5.81)
RDW: 14 % (ref 11.5–15.5)
WBC: 10.1 10*3/uL (ref 4.0–10.5)
nRBC: 0 % (ref 0.0–0.2)

## 2018-08-05 LAB — HEPARIN LEVEL (UNFRACTIONATED)
Heparin Unfractionated: 0.5 IU/mL (ref 0.30–0.70)
Heparin Unfractionated: 0.3 IU/mL (ref 0.30–0.70)
Heparin Unfractionated: 0.24 IU/mL — ABNORMAL LOW (ref 0.30–0.70)

## 2018-08-05 LAB — OCCULT BLOOD X 1 CARD TO LAB, STOOL: Fecal Occult Bld: NEGATIVE

## 2018-08-05 MED ORDER — PANTOPRAZOLE SODIUM 40 MG IV SOLR
40.0000 mg | Freq: Two times a day (BID) | INTRAVENOUS | Status: DC
  Administered 2018-08-05 – 2018-08-08 (×8): 40 mg via INTRAVENOUS
  Filled 2018-08-05 (×8): qty 40

## 2018-08-05 MED ORDER — HEPARIN (PORCINE) 25000 UT/250ML-% IV SOLN
1400.0000 [IU]/h | INTRAVENOUS | Status: DC
  Administered 2018-08-06 (×2): 1400 [IU]/h via INTRAVENOUS
  Filled 2018-08-05: qty 250

## 2018-08-05 NOTE — Progress Notes (Addendum)
PHARMACY - PHYSICIAN COMMUNICATION CRITICAL VALUE ALERT - BLOOD CULTURE IDENTIFICATION (BCID)  Derek Curry is an 78 y.o. male who presented to [facility] on 08/04/2018 with a chief complaint of intractable RUQ pain  Assessment: acute cholecystitis  Name of physician (or Provider) Contacted:  [facility] via AMION  Current antibiotics: Zosyn  Changes to prescribed antibiotics recommended:  Patient is on recommended antibiotics - No changes needed   Called by lab for 3/4 bottles w/ GNRs> E coli.   No results found for this or any previous visit.  Eudelia Bunch, Pharm.D 08/05/2018 12:12 AM

## 2018-08-05 NOTE — Progress Notes (Signed)
[place] for heparin Indication: atrial fibrillation  Allergies  Allergen Reactions  . Benazepril Anaphylaxis  . Shellfish Allergy Swelling  . Vicodin [Hydrocodone-Acetaminophen] Other (See Comments)    Dizzy    Patient Measurements: Height: 5\' 8"  (172.7 cm) Weight: 173 lb (78.5 kg) IBW/kg (Calculated) : 68.4 Heparin Dosing Weight = TBW = 78 kg  Vital Signs: Temp: 98.3 F (36.8 C) (06/21 0507) Temp Source: Oral (06/21 0507) BP: 113/57 (06/21 0507) Pulse Rate: 64 (06/21 0507)  Labs: Recent Labs    08/04/18 0033 08/04/18 0102  08/04/18 0818 08/04/18 1845 08/05/18 0353 08/05/18 1150  HGB 12.7* 13.3  --   --   --  10.8*  --   HCT 38.9* 39.0  --   --   --  33.1*  --   PLT 178  --   --   --   --  138*  --   APTT  --   --    < > 35 48* 75* 52*  LABPROT  --   --   --  14.8  --   --   --   INR  --   --   --  1.2  --   --   --   HEPARINUNFRC  --   --   --  1.02*  --  0.50 0.30  CREATININE 1.65*  --   --   --   --  1.30*  --    < > = values in this interval not displayed.    Estimated Creatinine Clearance: 46 mL/min (A) (by C-G formula based on SCr of 1.3 mg/dL (H)).  Assessment: Patient admitted with abdominal pain - surgery consulted. Planning for possible surgical intervention on Monday. Pt was taking apixaban 5 mg PO BID PTA for atrial fibrillation. Anticoagulation being converted to heparin drip in anticipation of surgery.  Last dose of apixaban: 6/19 @ 2200  08/05/2018  APTT now subtherapeutic (52 sec) on heparin infusion at 1100 units/hr. Heparin level also trended down at lower end of goal range (0.3)   No bleeding or infusion related issues reported by RN  CBC: Hg low at 10.8, pltc low at 138  Goal of Therapy:  Heparin level 0.3-0.7 units/ml APTT 66-102 seconds Monitor platelets by anticoagulation protocol: Yes   Plan:   Increase heparin infusion to 1250 units/hr  Check confirmatory aPTT in 8 hours  Monitor  using aPTT until aPTT and HL correlate   CBC and HL/aPTT daily while on heparin  Monitor for transition back to apixaban post-op  Netta Cedars, PharmD, BCPS 08/05/2018 1:07 PM

## 2018-08-05 NOTE — Consult Note (Addendum)
Referring Provider: Dr. Raiford Noble Primary Care Physician:  Jefm Petty, MD Primary Gastroenterologist:  Colon Branch, MD [facility] Unassigned   Reason for Consultation: Elevated LFTs, T. Bili   HPI: Derek Curry is a 78 y.o. male with a past medical history significant significant for atrial fibrillation, past cardiac ablation 2-3 yrs ago, pacemaker placed 11 yrs ago, diabetes mellitus, GERD, hypertension, prostatitis and gout. Past appendectomy. He developed epigastric pain one week ago that lasted for 2 to 3 hours. No N/V. He noticed his urine was darker in color for 3 to 4 days then urine was normal yellow color. He felt well until Friday 6/19. He ate fried chicken, ribs, potato salad and cherry pie for dinner. He developed significant upper abdominal pain around 7:30pm. The next day his dull upper abdominal pain worsened and he presented to [facility] ED for further evaluation. Labs showed elevated T. bil and LFTs. He was transferred to [facility] ED for further evaluation.   Currently, he denies having any N/V. Central upper abdominal pain is less. No dysphagia or heartburn. He reports passing a normal solid brown stool once daily. However, approximately 1 week ago, he passed a looser black stool after taking one dose of Maalox. No further black stools. He denies NSAID use. His last dose of Eliquis was Friday 6/19 am. He noticed his urine  became darker brown 3 days ago. He reported having an EGD and small bowel capsule endoscopy at [facility] 10/2015 due to anemia,  results were normal. History of GERD. Denies having hx of GI bleed. He underwent a colonoscopy approximately 5 years ago by Dr. Colon Branch [facility], reported having a few polyps. No family history of gastric or colon cancer. Limited alcohol intake. I will request copies of his past EGD/ capsule endoscopy and colonoscopy reports.  ED course:  Rectal Temp. 102.7. Labs: Sodium 137.  Potassium 4.0.  Glucose 137.   BUN 23.  Creatinine 1.65.  Anion gap 12.  Alk phos 249.  Albumin 3.6.  Lipase 31.  AST 244.  ALT 132.  Total bili 2.8. Troponin < 0.30. BNP. Ethyl alcohol < 10.  Labs 07/2118: Sodium 139.  Potassium 3.5.  Glucose 116.  BUN 19.  Creatinine 1.30.  Anion gap 9.  Alk phosphatase 162.  Albumin 2.7.  AST 81.  ALT 102.  Total bili 3.0.   WBC 10.1.  Hemoglobin 10.8.  Hematocrit 33.1.  MCV 104.1.  Platelet 138. INR 1.2.BNP 100.4  Chest Xray: negative  RUQ Abdominal sonogram: 1. Cholelithiasis.  No sonographic evidence of acute cholecystitis. CBD diameter 76m. 2. Findings of adenomyomatosis of the gallbladder. 3. No biliary ductal dilatation. 4. Normal liver. Patent portal vein.  Abd/pelvic CT:  1. Gallstones with possible gallbladder wall thickening. No biliary dilatation. 2. Marked gastric distention with ingested material, raising concern for gastroparesis or delayed gastric emptying. 3. Normal positioning of the ligament of Treitz, however proximal jejunum then courses abnormally into the right abdomen. It is unclear whether this is secondary to displacement related to distended stomach versus partial small bowel malrotation. No associated bowel inflammation or obstruction. 4. Colonic diverticulosis without diverticulitis. 6. Subcentimiter cyst anterior left lobe.   [facility]: Alk phos 91. AST 18. ALT 25. Albumin 3.7.   Past Medical History:  Diagnosis Date   Diabetes mellitus    GERD (gastroesophageal reflux disease)    Gout    Hypertension    Prostatitis     Past Surgical  History:  Procedure Laterality Date   PACEMAKER INSERTION      Prior to Admission medications   Medication Sig Start Date End Date Taking? Authorizing Provider  acetaminophen (TYLENOL) 650 MG CR tablet Take 1,300 mg by mouth every 8 (eight) hours as needed for pain.   Yes [provider]  allopurinol (ZYLOPRIM) 300 MG tablet Take 300 mg by mouth daily.    Yes [provider]  aluminum-magnesium hydroxide-simethicone (MAALOX) 200-200-20 MG/5ML SUSP Take 30 mLs by mouth 4 (four) times daily as needed (heart burn).   Yes [provider]  apixaban (ELIQUIS) 5 MG TABS tablet Take 5 mg by mouth 2 (two) times daily.  06/03/16  Yes [provider]  diltiazem (CARDIZEM CD) 240 MG 24 hr capsule Take 240 mg by mouth daily.  05/24/16  Yes [provider]  fluticasone (FLONASE) 50 MCG/ACT nasal spray Place 1 spray into both nostrils daily as needed for allergies or rhinitis.   Yes [provider]  lisinopril-hydrochlorothiazide (PRINZIDE,ZESTORETIC) 20-25 MG per tablet Take 1 tablet by mouth daily.   Yes [provider]  Magnesium Oxide 250 MG TABS Take 250 mg by mouth daily.    Yes [provider]  meclizine (ANTIVERT) 12.5 MG tablet Take 12.5 mg by mouth 3 (three) times daily as needed for dizziness or nausea.  03/12/15  Yes [provider]  naproxen sodium (ALEVE) 220 MG tablet Take 440 mg by mouth 2 (two) times daily as needed (pain).   Yes [provider]  omeprazole (PRILOSEC) 40 MG capsule Take 40 mg by mouth daily.  04/13/18  Yes [provider]  simvastatin (ZOCOR) 10 MG tablet Take 10 mg by mouth daily.  04/13/18  Yes [provider]  valACYclovir (VALTREX) 500 MG tablet Take 500 mg by mouth 2 (two) times daily as needed (outbreak).  03/28/18  Yes [provider]  zolpidem (AMBIEN) 5 MG tablet Take 5 mg by mouth at bedtime as needed for sleep.    Yes [provider]  glucose blood (ACCU-CHEK GUIDE) test strip USE TO CHECK BLOOD SUGAR TWICE A DAY 03/21/17   [provider]    Current Facility-Administered Medications  Medication Dose Route Frequency Provider Last Rate Last Dose   0.9 %  sodium chloride infusion   Intravenous Continuous Shelly Coss, MD 100 mL/hr at 08/05/18 0645     diltiazem (CARDIZEM CD) 24 hr capsule 240 mg  240 mg Oral Daily  Shelly Coss, MD   240 mg at 08/04/18 1050   fentaNYL (SUBLIMAZE) injection 25-50 mcg  25-50 mcg Intravenous Q2H PRN Shelly Coss, MD       heparin ADULT infusion 100 units/mL (25000 units/267m sodium chloride 0.45%)  1,100 Units/hr Intravenous Continuous SLenis Noon RPH 11 mL/hr at 08/05/18 0640 1,100 Units/hr at 08/05/18 0640   hydrALAZINE (APRESOLINE) injection 10 mg  10 mg Intravenous Q6H PRN AShelly Coss MD       morphine 2 MG/ML injection 2 mg  2 mg Intravenous Q4H PRN AShelly Coss MD       ondansetron (ZOFRAN) injection 4 mg  4 mg Intravenous Q6H PRN AShelly Coss MD       piperacillin-tazobactam (ZOSYN) IVPB 3.375 g  3.375 g Intravenous Q8H SLenis Noon RPH 12.5 mL/hr at 08/05/18 0642 3.375 g at 08/05/18 05537   Allergies as of 08/04/2018 - Review Complete 08/04/2018  Allergen Reaction Noted   Benazepril Anaphylaxis 08/02/2016   Shellfish allergy Swelling 04/02/2015  Vicodin [hydrocodone-acetaminophen] Other (See Comments) 06/24/2011    History reviewed. No pertinent family history.  Social History   Socioeconomic History   Marital status: Married    Spouse name: Not on file   Number of children: Not on file   Years of education: Not on file   Highest education level: Not on file  Occupational History   Not on file  Social Needs   Financial resource strain: Not on file   Food insecurity    Worry: Never true    Inability: Never true   Transportation needs    Medical: No    Non-medical: No  Tobacco Use   Smoking status: Never Smoker  Substance and Sexual Activity   Alcohol use: No   Drug use: No   Sexual activity: Not on file  Lifestyle   Physical activity    Days per week: Not on file    Minutes per session: Not on file   Stress: Not on file  Relationships   Social connections    Talks on phone: Not on file    Gets together: Not on file    Attends religious service: Not on file    Active member of club or  organization: Not on file    Attends meetings of clubs or organizations: Not on file    Relationship status: Not on file   Intimate partner violence    Fear of current or ex partner: Not on file    Emotionally abused: Not on file    Physically abused: Not on file    Forced sexual activity: Not on file  Other Topics Concern   Not on file  Social History Narrative   Not on file    Review of Systems: Gen: Denies fever, sweats or chills. No weight loss.  CV: Denies chest pain, palpitations or edema. See HPI. Resp: Denies cough, shortness of breath of hemoptysis.  GI: See HPI. GU : See HPI. Denies urinary burning, increased urinary frequency or incontinence. MS: Denies joint pain, muscles aches or weakness. Derm: Denies rash, itchiness, skin lesions or unhealing ulcers. Psych: Denies depression, anxiety, memory loss, suicidal ideation and confusion. Heme: Denies bruising, bleeding. Neuro:  Denies headaches, dizziness or paresthesias. Endo:  Denies any problems with thyroid or adrenal function.  Physical Exam: Vital signs in last 24 hours: Temp:  [98.3 F (36.8 C)-99 F (37.2 C)] 98.3 F (36.8 C) (06/21 0507) Pulse Rate:  [64-78] 64 (06/21 0507) Resp:  [18-20] 19 (06/21 0507) BP: (108-120)/(57-76) 113/57 (06/21 0507) SpO2:  [97 %-100 %] 97 % (06/21 0507) Last BM Date: 08/03/18 General:   Alert,  well-developed, well-nourished, pleasant and cooperative in NAD. Head:  Normocephalic and atraumatic. Eyes:  Sclera clear, no icterus.Conjunctiva pink. Ears:  Normal auditory acuity. Nose:  No deformity, discharge or lesions. Mouth:  No deformity or lesions.   Neck:  Supple. Lungs: Lungs clear throughout, no crackles or wheezes. Heart:  Distant S1,S2, irregular rhythm, no murmur. Abdomen: Protuberant, mild tenderness LUQ and epigastric area without  Rebound or guarding, + BS x 4 quads. Rectal:  Deferred  Msk:  Symmetrical without gross deformities. . Pulses:  Normal pulses  noted. Extremities: LEs with trace edema. Neurologic:  Alert and  oriented x4;  grossly normal neurologically. Skin:  Intact without significant lesions or rashes.. Psych:  Alert and cooperative. Normal mood and affect.  Intake/Output from previous day: 06/20 0701 - 06/21 0700 In: 1448 [I.V.:1405.1; IV Piggyback:43] Out: 950 [Urine:950] Intake/Output this shift: No intake/output  data recorded.  Lab Results: Recent Labs    08/04/18 0033 08/04/18 0102 08/05/18 0353  WBC 8.4  --  10.1  HGB 12.7* 13.3 10.8*  HCT 38.9* 39.0 33.1*  PLT 178  --  138*   BMET Recent Labs    08/04/18 0033 08/04/18 0102 08/05/18 0353  NA 137 138 139  K 4.0 4.8 3.5  CL 102  --  108  CO2 23  --  22  GLUCOSE 137*  --  116*  BUN 23  --  19  CREATININE 1.65*  --  1.30*  CALCIUM 9.6  --  8.4*   LFT Recent Labs    08/05/18 0353  PROT 6.2*  ALBUMIN 2.7*  AST 81*  ALT 102*  ALKPHOS 162*  BILITOT 3.0*   PT/INR Recent Labs    08/04/18 0818  LABPROT 14.8  INR 1.2     Studies/Results: Ct Abdomen Pelvis W Contrast  Result Date: 08/04/2018 CLINICAL DATA:  Diffuse abdominal pain.  Fever. EXAM: CT ABDOMEN AND PELVIS WITH CONTRAST TECHNIQUE: Multidetector CT imaging of the abdomen and pelvis was performed using the standard protocol following bolus administration of intravenous contrast. CONTRAST:  75m OMNIPAQUE IOHEXOL 300 MG/ML  SOLN COMPARISON:  Report from abdominal CT 03/13/2014, images not available. FINDINGS: Lower chest: Linear atelectasis in both lower lobes. Pacemaker partially included. Hepatobiliary: Subcentimeter cyst in the anterior left lobe. Layering hyperdensity in the gallbladder may represent stones or sludge. Suggestion of mild gallbladder wall thickening at 4 mm. No biliary dilatation. Pancreas: Mild parenchymal atrophy. No ductal dilatation or inflammation. Spleen: Normal in size without focal abnormality. Adrenals/Urinary Tract: No adrenal nodule. No hydronephrosis or  perinephric edema. Homogeneous renal enhancement with symmetric excretion on delayed phase imaging. Multiple low-density lesions in the right greater than left kidney are likely cyst, some of which are too small to characterize. Urinary bladder is physiologically distended without wall thickening. Stomach/Bowel: Prominent gastric distention with ingested material. There is normal positioning of the ligament of Treitz, however small bowel than courses in the right abdomen. No associated bowel wall thickening, inflammation, or obstruction. Colon is normally positioned. Appendix surgically absent per history. No pericecal inflammation. Multifocal colonic diverticulosis. No diverticulitis. Mild sigmoid colonic redundancy. Vascular/Lymphatic: Normal caliber abdominal aorta. Portal vein and mesenteric vessels are patent. No adenopathy. Reproductive: Prostate is unremarkable. Other: No free air, free fluid, or intra-abdominal fluid collection. Small fat containing umbilical hernia Musculoskeletal: There are no acute or suspicious osseous abnormalities. Degenerative change in the spine. IMPRESSION: 1. Gallstones with possible gallbladder wall thickening. Recommend right upper quadrant ultrasound for further evaluation. 2. Marked gastric distention with ingested material, raising concern for gastroparesis or delayed gastric emptying. 3. Normal positioning of the ligament of Treitz, however proximal jejunum then courses abnormally into the right abdomen. It is unclear whether this is secondary to displacement related to distended stomach versus partial small bowel malrotation. No associated bowel inflammation or obstruction. 4. Colonic diverticulosis without diverticulitis. Electronically Signed   By: MKeith RakeM.D.   On: 08/04/2018 02:42   Dg Chest Port 1 View  Result Date: 08/04/2018 CLINICAL DATA:  Shortness of breath. Fever. Chills. EXAM: PORTABLE CHEST 1 VIEW COMPARISON:  Frontal and lateral views 05/10/2017  FINDINGS: Left-sided pacemaker in place. Unchanged heart size and mediastinal contours. No pulmonary edema, focal airspace disease, pleural effusion or pneumothorax. IMPRESSION: No acute chest finding. Electronically Signed   By: MKeith RakeM.D.   On: 08/04/2018 01:33   [place]Abdomen Limited Ruq  Result  Date: 08/04/2018 CLINICAL DATA:  Ascending cholangitis EXAM: ULTRASOUND ABDOMEN LIMITED RIGHT UPPER QUADRANT COMPARISON:  08/04/2018 CT abdomen/pelvis FINDINGS: Gallbladder: No gallbladder distention. Ring down artifact in the nondependent gallbladder wall indicative of adenomyomatosis. Layering subcentimeter shadowing gallstones. No sonographic Murphy sign. No pericholecystic fluid. Common bile duct: Diameter: 5 mm Liver: No focal lesion identified. Within normal limits in parenchymal echogenicity. Portal vein is patent on color Doppler imaging with normal direction of blood flow towards the liver. IMPRESSION: 1. Cholelithiasis.  No sonographic evidence of acute cholecystitis. 2. Findings of adenomyomatosis of the gallbladder. 3. No biliary ductal dilatation. 4. Normal liver. Electronically Signed   By: Ilona Sorrel M.D.   On: 08/04/2018 05:25    IMPRESSION/PLAN:  1. 78 y.o. male with RUQ/epigastric pain, elevated LFTs and hyperbilirubinemia. Abdominal sono and CTAP showed gallstones, normal CBD. Possible choledocholithiasis/cholangitis. Surgery consulted, plan for possible laparoscopic cholecystectomy Monday or Tuesday, ? IOC. AP 162. AST 81. ALT 102. T.bili 3.0.  WBC 10.3. Rectal temp 102.7 in ED. Currently afebrile. -patient cannot have MRCP due to pacemaker placed in 2011 (patient to obtain pacemaker document to verify if his model is MRI compatible as verified with [facility] MRI tech, if MRCP appropriate would have to be done at [facility]). -clear liquids for now -continue Zosyn IV for now -repeat  Hepatic panel in am -monitor temperature Q shift -Protonix 61m IV bid  2. Hx of atrial  fibrillation, past ablation, pacemaker on Eliquis. Last dose of Eliquis was 08/03/2018 am. INR 1.2. Followed by cardiologist, Dr. ARaenette Rover -continue heparin gtt -hold Eliquis   3. Macrocytic anemia. Hg 10.8. HCT 33.1. MCV 104.1. -FOBT -repeat CBC in am -monitor for GI bleeding  -transfuse for Hg < 8 -check Vitamin b12 and iron studies  4. Hx of GERD on Omeprazole 48mQD. CTAP 6/20 showed marked gastric distension, ? Gastroparesis -eventual EGD  -Protonix 4081mV bid  5. Hx of colon polyps -will request past colonoscopy records  Further recommendations per Dr. StaNehemiah SettleKDorathy Daft 08/05/2018, 9:20 AM      Attending Physician Note   I have taken a history, examined the patient and reviewed the chart. I agree with the Advanced Practitioner's note, impression and recommendations.  RUQ pain, cholelithiasis, Enterobacteriaceae and E. coli bacteremia, elevated LFTs. No biliary dilation on CT or US.KoreaBD 5 mm on US.KoreaFTs improved and t bili slightly increased. MRCP not an option with pacemaker. Cholelithiasis, cholecystitis, possible cholangitis. Given resolving RUQ pain, LFTs improving suspect he passed a CBD stone. Recommend lap chole with IOC. If IOC positive then ERCP. Continue IV antibiotics.   Gastric distention and retention noted on CT. R/O gastroparesis, partial GOO  GERD. Continue PPI bid for now  [employer] Eliquis  Pacemaker   MalLucio EdwardD FACCoffey County [phone]

## 2018-08-05 NOTE — Progress Notes (Signed)
Elevated liver enzymes  Subjective: Pain better today  Objective: Vital signs in last 24 hours: Temp:  [98.3 F (36.8 C)-99 F (37.2 C)] 98.3 F (36.8 C) (06/21 0507) Pulse Rate:  [64-78] 64 (06/21 0507) Resp:  [18-20] 19 (06/21 0507) BP: (108-120)/(57-76) 113/57 (06/21 0507) SpO2:  [97 %-100 %] 97 % (06/21 0507) Last BM Date: 08/03/18  Intake/Output from previous day: 06/20 0701 - 06/21 0700 In: 1448 [I.V.:1405.1; IV Piggyback:43] Out: 950 [Urine:950] Intake/Output this shift: No intake/output data recorded.  General appearance: alert and cooperative GI: normal findings: soft, non-tender  Lab Results:  Results for orders placed or performed during the hospital encounter of 08/04/18 (from the past 24 hour(s))  APTT     Status: None   Collection Time: 08/04/18  8:18 AM  Result Value Ref Range   aPTT 35 24 - 36 seconds  Protime-INR     Status: None   Collection Time: 08/04/18  8:18 AM  Result Value Ref Range   Prothrombin Time 14.8 11.4 - 15.2 seconds   INR 1.2 0.8 - 1.2  Heparin level (unfractionated)     Status: Abnormal   Collection Time: 08/04/18  8:18 AM  Result Value Ref Range   Heparin Unfractionated 1.02 (H) 0.30 - 0.70 IU/mL  APTT     Status: Abnormal   Collection Time: 08/04/18  6:45 PM  Result Value Ref Range   aPTT 48 (H) 24 - 36 seconds  Comprehensive metabolic panel     Status: Abnormal   Collection Time: 08/05/18  3:53 AM  Result Value Ref Range   Sodium 139 135 - 145 mmol/L   Potassium 3.5 3.5 - 5.1 mmol/L   Chloride 108 98 - 111 mmol/L   CO2 22 22 - 32 mmol/L   Glucose, Bld 116 (H) 70 - 99 mg/dL   BUN 19 8 - 23 mg/dL   Creatinine, Ser 1.30 (H) 0.61 - 1.24 mg/dL   Calcium 8.4 (L) 8.9 - 10.3 mg/dL   Total Protein 6.2 (L) 6.5 - 8.1 g/dL   Albumin 2.7 (L) 3.5 - 5.0 g/dL   AST 81 (H) 15 - 41 U/L   ALT 102 (H) 0 - 44 U/L   Alkaline Phosphatase 162 (H) 38 - 126 U/L   Total Bilirubin 3.0 (H) 0.3 - 1.2 mg/dL   GFR calc non Af Amer 53 (L) >60 mL/min    GFR calc Af Amer >60 >60 mL/min   Anion gap 9 5 - 15  CBC     Status: Abnormal   Collection Time: 08/05/18  3:53 AM  Result Value Ref Range   WBC 10.1 4.0 - 10.5 K/uL   RBC 3.18 (L) 4.22 - 5.81 MIL/uL   Hemoglobin 10.8 (L) 13.0 - 17.0 g/dL   HCT 33.1 (L) 39.0 - 52.0 %   MCV 104.1 (H) 80.0 - 100.0 fL   MCH 34.0 26.0 - 34.0 pg   MCHC 32.6 30.0 - 36.0 g/dL   RDW 14.0 11.5 - 15.5 %   Platelets 138 (L) 150 - 400 K/uL   nRBC 0.0 0.0 - 0.2 %  APTT     Status: Abnormal   Collection Time: 08/05/18  3:53 AM  Result Value Ref Range   aPTT 75 (H) 24 - 36 seconds  Heparin level (unfractionated)     Status: None   Collection Time: 08/05/18  3:53 AM  Result Value Ref Range   Heparin Unfractionated 0.50 0.30 - 0.70 IU/mL     Studies/Results Radiology  MEDS, Scheduled . diltiazem  240 mg Oral Daily     Assessment: Elevated liver enzymes Obstructive Juandice  Plan: T bili up again today.  Would rec GI consult Cont to hold Elliquis Will repeat labs in AM   LOS: 1 day    Rosario Adie, MD [facility], [place] [phone]   08/05/2018 8:15 AM

## 2018-08-05 NOTE — Plan of Care (Signed)
  Problem: Education: Goal: Knowledge of General Education information will improve Description: Including pain rating scale, medication(s)/side effects and non-pharmacologic comfort measures Outcome: Completed/Met   Problem: Health Behavior/Discharge Planning: Goal: Ability to manage health-related needs will improve Outcome: Completed/Met   Problem: Clinical Measurements: Goal: Diagnostic test results will improve Outcome: Completed/Met   Problem: Activity: Goal: Risk for activity intolerance will decrease Outcome: Progressing   Problem: Nutrition: Goal: Adequate nutrition will be maintained Outcome: Progressing   Problem: Coping: Goal: Level of anxiety will decrease Outcome: Progressing   Problem: Pain Managment: Goal: General experience of comfort will improve Outcome: Completed/Met

## 2018-08-05 NOTE — Progress Notes (Signed)
Pt report the following information regarding his pacemaker:  Implantable Pacemaker #[id number] 02/24/2009 Maker: [employer]  [phone]  Derek Curry, Derek Curry

## 2018-08-05 NOTE — Progress Notes (Signed)
PROGRESS NOTE    Derek Curry  SEG:[id number] DOB: [birth year] DOA: 08/04/2018 PCP: Jefm Petty, MD  Brief Narrative:  HPI per Dr. Shelly Coss on 08/04/2018 Derek Curry is a 78 y.o. male  with hx of  DM, HTN,  Afib,Gout  who  presents with acute abdominal pain after a fatty meal at around 6 pm yesterday .  Patient has been having pain on his right side of the abdomen on and off for last 1 to 2 weeks.  Pain was unbearable and severe yesterday evening so he decided to come to [place].  Patient describes the pain as dull and rates as 8/10.  It became worse with movement.  Patient also reported of having subjective fevers at home with chills.  He denied any nausea, vomiting or diarrhea.  His bowel movements have been normal.  He also complained of shortness of breath when lying flat in the bed. Patient is physically active  and is actively working as a Curator.  He  has history of atrial fibrillation and is on Eliquis at home and follows with Dr. Minna Merritts at [facility]. Patient seen and examined the bedside.  Currently he is hemodynamically stable.  Denies any abdominal pain while on rest. Denies any chest pain, cough, dysuria.   ED Course: Found to be tachypneic on arrival.  Noted to have fever of 102 Fahrenheit when checked rectally.  Satting in the range of low 90s.  CT abdomen/pelvis showed gallstones with gallbladder wall thickening, possible gastroparesis.  Liver enzymes were elevated.  Clinical picture suggestive of acute cholecystitis.  Kept n.p.o.  Started on antibiotics.  General surgery consulted.  **Interim History  General surgery and gastroenterology evaluated and they are recommending a cholecystectomy with IOC.  LFTs are trending down unlikely has passed a gallstone.  Blood cultures grew out E. coli and likely has a bacteremia from a GI origin so continue IV Zosyn and narrow antibiotics based on sensitivities.  Timing of cholecystectomy to be determined by  general surgery.  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 Obstructive Jaundice with concern for Choledocholithiasis and Ascending Cholangitis: -CT abdomen/pelvis showed "Gallstones with possible gallbladder wall thickening. Marked gastric distention with ingested material, raising concern for gastroparesis or delayed gastric emptying. Normal positioning of the ligament of Treitz, however proximal jejunum then courses abnormally into the right abdomen. It is unclear whether this is secondary to displacement related to distended stomach versus partial small bowel malrotation. No associated bowel inflammation or obstruction." -RUQ U/S showed "Cholelithiasis.  No sonographic evidence of acute cholecystitis. Findings of adenomyomatosis of the gallbladder. No biliary ductal dilatation.  Normal liver."  -Presented with right-sided abdominal pain.  No nausea vomiting or diarrhea.  Also had fever,  chills at home.  -Unable to Do MRCP due to Pacer -GI recommending that since his abdominal pain is resolving ALT is improved and that he may have passed a common bile duct stone recommending a lap chole with IOC if IOC is positive and they are open to recommend an ERCP -General  surgery planning for cholecystectomy likely on Monday or Tuesday as he was on Eliquis at home and the last dose was yesterday evening. -Continue to hold Eliquis and continue with pain management with IV Fentanyl 25-50 mcg q2hprn Moderate Pain and IV Morphine 2 mg every 4 PRN for severe pain and continue symptomatic treatment with antiemetics with 4 mg of IV Zofran every 6h as needed for nausea vomiting  Acute Kidney Injury -Patient denies any history of CKD but suspect that he may have some.   -His Creatinine on 05/2013 was 2.9 but probably that was just a transient episode.  -BUN/Cr went from 23/1.65 -> 19/1.30 -Continue normal saline at a rate of 100 mL's per hour -Avoid nephrotoxic medications if possible, contrast dyes, as well as hypotension -Continue monitor trend renal function and repeat CMP in a.m.  Abnormal LFT's/Elevated Liver Enzymes Hyperbilirubinemia  -Secondary to Obstructive Jaundice and Suspected Ascending Cholangitis -AST went from 244 -> 81 and ALT went from 132 -> 102 -Denies heavy alcohol abuse, is a social drinker.   -Mildly elevated bilirubin at 2.8 but is worsened to 3.0.  -Really unable to do MRCP so GI is recommending general surgery do a cholecystectomy with IOC and if IOC is positive then they will do a ERCP  Normal CBD as per ultrasound.  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  Paroxysmal A. Fib -Currently heart rate is controlled.  He follows with electrophysiologist Dr. Minna Merritts at [place] for anticoagulation at home.  Eliquis will be held for possible surgical intervention.   -Started on heparin drip and will continue with Pharmacy to Dose -On Cardizem 240 mg p.o. daily for rate control  Hypertension -On Lisinopril-Hydrochlorothiazide at home.   -Currently blood pressure stable and was 113/57 -Antihypertensives on hold due to acute kidney injury.   -Continue IV Hydralazine 10 mg q6h PRN meds as needed.  Gout -On  Allopurinol at home but currently holding   Diabetes mellitus -Not on medication at home.  On dietary modification.  Hyperlipidemia -On statin at home.   -Will hold because of elevated liver enzymes.  GERD -C/w Pantoprazole 40 mg IV q12h  Gastric Distention and Retention noted on CT -Clear Liquid Diet and Eventual EGD  -Will need to r/o Gastroparesis and Partial Gastric Outlet Obstruction  Macrocytic Anemia -Patient's Hb/Hct went from 13.3/39.0 -> 10.8/33.1 -Check FOBT -Check Anemia Panel in the AM -Continue to Monitor for S/Sx of Bleeding as patient is anticoagulated with Heparin gtt; Currently no Overt Bleeding Noted -Repeat CBC in AM   Thrombocytopenia -Patient's Platelet Count is now 138,000 -Continue to [place] for S/Sx of Bleeding -Repeat CBC in AM   DVT prophylaxis: Anticoagulated with Heparin gtt Code Status: FULL CODE  Family Communication: No family present at bedside  Disposition Plan:   Consultants:   General Surgery  Gastroenterology   Procedures:  None   Antimicrobials:  Anti-infectives (From admission, onward)   Start     Dose/Rate Route Frequency Ordered Stop   08/04/18 1400  piperacillin-tazobactam (ZOSYN) IVPB 3.375 g     3.375 g 12.5 mL/hr over 240 Minutes Intravenous Every 8 hours 08/04/18 0746     08/04/18  0445  piperacillin-tazobactam (ZOSYN) IVPB 3.375 g     3.375 g 100 mL/hr over 30 Minutes Intravenous  Once 08/04/18 0430 08/04/18 7672     Subjective: Seen and examined at bedside and without complaint of any abdominal pain currently.  No nausea or vomiting.  Denies chest pain, lightheadedness or dizziness but no other concerns complaints at this time.  Objective: Vitals:   08/04/18 0954 08/04/18 1100 08/04/18 1956 08/05/18 0507  BP: (!) 108/58 115/66 120/76 (!) 113/57  Pulse: 78  69 64  Resp: 18  20 19   Temp: 99 F (37.2 C)  98.8 F (37.1 C) 98.3 F (36.8 C)  TempSrc: Oral  Oral Oral  SpO2: 100%  100% 97%  Weight:        Height:        Intake/Output Summary (Last 24 hours) at 08/05/2018 1252 Last data filed at 08/05/2018 0947 Gross per 24 hour  Intake 1448.03 ml  Output 500 ml  Net 948.03 ml   Filed Weights   08/04/18 0024  Weight: 78.5 kg   Examination: Physical Exam:  Constitutional: WN/WD overweight AAM in NAD and appears calm and comfortable Eyes: Lids and conjunctivae normal, sclerae mildly icteric  ENMT: External Ears, Nose appear normal. Grossly normal hearing.  Neck: Appears normal, supple, no cervical masses, normal ROM, no appreciable thyromegaly; no JVD Respiratory: Diminished to auscultation bilaterally, no wheezing, rales, rhonchi or crackles. Normal respiratory effort and patient is not tachypenic. No accessory muscle use.  Cardiovascular: RRR, no murmurs / rubs / gallops. S1 and S2 auscultated. Mild extremity edema.  Abdomen: Soft, non-tender, Distended mildly. Bowel sounds positive x4.  GU: Deferred. Musculoskeletal: No clubbing / cyanosis of digits/nails. No joint deformity upper and lower extremities.  Skin: No rashes, lesions, ulcers on a limited skin evaluation. No induration; Warm and dry.  Neurologic: CN 2-12 grossly intact with no focal deficits.  Romberg sign cerebellar reflexes not assessed.  Psychiatric: Normal judgment and insight. Alert and oriented x 3. Normal mood and appropriate affect.   Data Reviewed: I have personally reviewed following labs and imaging studies  CBC: Recent Labs  Lab 08/04/18 0033 08/04/18 0102 08/05/18 0353  WBC 8.4  --  10.1  NEUTROABS 7.1  --   --   HGB 12.7* 13.3 10.8*  HCT 38.9* 39.0 33.1*  MCV 102.9*  --  104.1*  PLT 178  --  096*   Basic Metabolic Panel: Recent Labs  Lab 08/04/18 0033 08/04/18 0102 08/05/18 0353  NA 137 138 139  K 4.0 4.8 3.5  CL 102  --  108  CO2 23  --  22  GLUCOSE 137*  --  116*  BUN 23  --  19  CREATININE 1.65*  --  1.30*  CALCIUM 9.6  --  8.4*   GFR: Estimated Creatinine Clearance: 46 mL/min  (A) (by C-G formula based on SCr of 1.3 mg/dL (H)). Liver Function Tests: Recent Labs  Lab 08/04/18 0033 08/05/18 0353  AST 244* 81*  ALT 132* 102*  ALKPHOS 249* 162*  BILITOT 2.8* 3.0*  PROT 8.3* 6.2*  ALBUMIN 3.6 2.7*   Recent Labs  Lab 08/04/18 0033  LIPASE 31   No results for input(s): AMMONIA in the last 168 hours. Coagulation Profile: Recent Labs  Lab 08/04/18 0818  INR 1.2   Cardiac Enzymes: No results for input(s): CKTOTAL, CKMB, CKMBINDEX, TROPONINI in the last 168 hours. BNP (last 3 results) No results for input(s): PROBNP in the last 8760 hours. HbA1C:  No results for input(s): HGBA1C in the last 72 hours. CBG: No results for input(s): GLUCAP in the last 168 hours. Lipid Profile: No results for input(s): CHOL, HDL, LDLCALC, TRIG, CHOLHDL, LDLDIRECT in the last 72 hours. Thyroid Function Tests: No results for input(s): TSH, T4TOTAL, FREET4, T3FREE, THYROIDAB in the last 72 hours. Anemia Panel: No results for input(s): VITAMINB12, FOLATE, FERRITIN, TIBC, IRON, RETICCTPCT in the last 72 hours. Sepsis Labs: Recent Labs  Lab 08/04/18 0037  LATICACIDVEN 1.7    Recent Results (from the past 240 hour(s))  SARS Coronavirus 2 (Hosp order,Performed in [facility] lab via [employer] ID)     Status: None   Collection Time: 08/04/18 12:33 AM   Specimen: Dry Nasal Swab (Abbott ID Now)  Result Value Ref Range Status   SARS Coronavirus 2 (Abbott ID Now) NEGATIVE NEGATIVE Final    Comment: (NOTE) SARS-CoV-2 target nucleic acids are NOT DETECTED. The SARS-CoV-2 RNA is generally detectable in upper and lower respiratory specimens during the acute phase of infection.  Negativeresults do not preclude SARS-CoV-2 infection, do not rule out coinfections with other pathogens, and should not be used as the  sole basis for treatment or other patient management decisions.  Negative results must be combined with clinical observations, patient history, and epidemiological  information. The expected result is Negative. Fact Sheet for Patients: GolfingFamily.no Fact Sheet for Healthcare Providers: https://www.hernandez-brewer.com/ This test is not yet approved or cleared by the [place] FDA and  has been authorized for detection and/or diagnosis of SARS-CoV-2 by FDA under an Emergency Use Authorization (EUA).  This EUA will remain in effect (meaning this test can be used) for the duration of  the COVID19 declaration under Section 5 64(b)(1) of the Act, 21 U.S.C.  section [phone] 3(b)(1), unless the authorization is terminated or revoked sooner. Performed at [facility], [address] [place], [place] [address]   Culture, blood (Routine X 2) w Reflex to ID Panel     Status: Abnormal (Preliminary result)   Collection Time: 08/04/18 12:43 AM   Specimen: BLOOD  Result Value Ref Range Status   Specimen Description   Final    BLOOD RIGHT ANTECUBITAL Performed at [facility], [address] [place], [place] [address]    Special Requests   Final    BOTTLES DRAWN AEROBIC AND ANAEROBIC Blood Culture adequate volume Performed at [facility], [address] [place], [place] [address]    Culture  Setup Time   Final    GRAM NEGATIVE RODS IN BOTH AEROBIC AND ANAEROBIC BOTTLES CRITICAL RESULT CALLED TO, READ BACK BY AND VERIFIED WITH: M.BELL,PHARMD 2337 08/04/2018 M.CAMPBELL Performed at [facility], [place] [address] [place], [place] [address]    Culture ESCHERICHIA COLI (A)  Final   Report Status PENDING  Incomplete  Blood Culture ID Panel (Reflexed)     Status: Abnormal   Collection Time: 08/04/18 12:43 AM  Result Value Ref Range Status   Enterococcus species NOT DETECTED NOT DETECTED Final   Listeria monocytogenes NOT DETECTED NOT DETECTED Final   Staphylococcus species NOT DETECTED NOT DETECTED Final   Staphylococcus aureus (BCID) NOT DETECTED NOT DETECTED Final    Streptococcus species NOT DETECTED NOT DETECTED Final   Streptococcus agalactiae NOT DETECTED NOT DETECTED Final   Streptococcus pneumoniae NOT DETECTED NOT DETECTED Final   Streptococcus pyogenes NOT DETECTED NOT DETECTED Final   Acinetobacter baumannii NOT DETECTED NOT DETECTED Final   Enterobacteriaceae  species DETECTED (A) NOT DETECTED Final    Comment: Enterobacteriaceae represent a large family of gram-negative bacteria, not a single organism. CRITICAL RESULT CALLED TO, READ BACK BY AND VERIFIED WITH: M.BELL,PHARMD 2337 08/04/2018 M.CAMPBELL    Enterobacter cloacae complex NOT DETECTED NOT DETECTED Final   Escherichia coli DETECTED (A) NOT DETECTED Final    Comment: CRITICAL RESULT CALLED TO, READ BACK BY AND VERIFIED WITH: M.BELL,PHARMD 2337 08/04/2018 M.CAMPBELL    Klebsiella oxytoca NOT DETECTED NOT DETECTED Final   Klebsiella pneumoniae NOT DETECTED NOT DETECTED Final   Proteus species NOT DETECTED NOT DETECTED Final   Serratia marcescens NOT DETECTED NOT DETECTED Final   Carbapenem resistance NOT DETECTED NOT DETECTED Final   Haemophilus influenzae NOT DETECTED NOT DETECTED Final   Neisseria meningitidis NOT DETECTED NOT DETECTED Final   Pseudomonas aeruginosa NOT DETECTED NOT DETECTED Final   Candida albicans NOT DETECTED NOT DETECTED Final   Candida glabrata NOT DETECTED NOT DETECTED Final   Candida krusei NOT DETECTED NOT DETECTED Final   Candida parapsilosis NOT DETECTED NOT DETECTED Final   Candida tropicalis NOT DETECTED NOT DETECTED Final    Comment: Performed at [facility], [place] [address] [place], [place] [address]  Culture, blood (Routine X 2) w Reflex to ID Panel     Status: None (Preliminary result)   Collection Time: 08/04/18 12:50 AM   Specimen: BLOOD  Result Value Ref Range Status   Specimen Description   Final    BLOOD LEFT ANTECUBITAL Performed at [facility], [address] [place], [place] [address]    Special Requests   Final     BOTTLES DRAWN AEROBIC AND ANAEROBIC Blood Culture adequate volume Performed at [facility], [address] [place], [place] [address]    Culture  Setup Time   Final    GRAM NEGATIVE RODS IN BOTH AEROBIC AND ANAEROBIC BOTTLES CRITICAL VALUE NOTED.  VALUE IS CONSISTENT WITH PREVIOUSLY REPORTED AND CALLED VALUE. Performed at [facility], [place] [address] [place], [place] [address]    Culture GRAM NEGATIVE RODS  Final   Report Status PENDING  Incomplete  SARS Coronavirus 2 (CEPHEID - Performed in [place] hospital lab), Hosp Order     Status: None   Collection Time: 08/04/18  6:25 AM   Specimen: Nasopharyngeal Swab  Result Value Ref Range Status   SARS Coronavirus 2 NEGATIVE NEGATIVE Final    Comment: (NOTE) If result is NEGATIVE SARS-CoV-2 target nucleic acids are NOT DETECTED. The SARS-CoV-2 RNA is generally detectable in upper and lower  respiratory specimens during the acute phase of infection. The lowest  concentration of SARS-CoV-2 viral copies this assay can detect is 250  copies / mL. A negative result does not preclude SARS-CoV-2 infection  and should not be used as the sole basis for treatment or other  patient management decisions.  A negative result may occur with  improper specimen collection / handling, submission of specimen other  than nasopharyngeal swab, presence of viral mutation(s) within the  areas targeted by this assay, and inadequate number of viral copies  (<250 copies / mL). A negative result must be combined with clinical  observations, patient history, and epidemiological information. If result is POSITIVE SARS-CoV-2 target nucleic acids are DETECTED. The SARS-CoV-2 RNA is generally detectable in upper and lower  respiratory specimens dur ing the acute phase of infection.  Positive  results are indicative of active infection with SARS-CoV-2.  Clinical  correlation with patient history and other diagnostic information is  necessary to  determine patient infection status.  Positive results do  not rule out bacterial infection or co-infection with other viruses. If result is PRESUMPTIVE POSTIVE SARS-CoV-2 nucleic acids MAY BE PRESENT.   A presumptive positive result was obtained on the submitted specimen  and confirmed on repeat testing.  While 2019 novel coronavirus  (SARS-CoV-2) nucleic acids may be present in the submitted sample  additional confirmatory testing may be necessary for epidemiological  and / or clinical management purposes  to differentiate between  SARS-CoV-2 and other Sarbecovirus currently known to infect humans.  If clinically indicated additional testing with an alternate test  methodology [phone]) is advised. The SARS-CoV-2 RNA is generally  detectable in upper and lower respiratory sp ecimens during the acute  phase of infection. The expected result is Negative. Fact Sheet for Patients:  StrictlyIdeas.no Fact Sheet for Healthcare Providers: BankingDealers.co.za This test is not yet approved or cleared by the [place] FDA and has been authorized for detection and/or diagnosis of SARS-CoV-2 by FDA under an Emergency Use Authorization (EUA).  This EUA will remain in effect (meaning this test can be used) for the duration of the COVID-19 declaration under Section 564(b)(1) of the Act, 21 U.S.C. section 360bbb-3(b)(1), unless the authorization is terminated or revoked sooner. Performed at [facility], [place] [address], [place], [place] [address]     Radiology Studies: Ct Abdomen Pelvis W Contrast  Result Date: 08/04/2018 CLINICAL DATA:  Diffuse abdominal pain.  Fever. EXAM: CT ABDOMEN AND PELVIS WITH CONTRAST TECHNIQUE: Multidetector CT imaging of the abdomen and pelvis was performed using the standard protocol following bolus administration of intravenous contrast. CONTRAST:  87mL OMNIPAQUE IOHEXOL 300 MG/ML  SOLN COMPARISON:   Report from abdominal CT 03/13/2014, images not available. FINDINGS: Lower chest: Linear atelectasis in both lower lobes. Pacemaker partially included. Hepatobiliary: Subcentimeter cyst in the anterior left lobe. Layering hyperdensity in the gallbladder may represent stones or sludge. Suggestion of mild gallbladder wall thickening at 4 mm. No biliary dilatation. Pancreas: Mild parenchymal atrophy. No ductal dilatation or inflammation. Spleen: Normal in size without focal abnormality. Adrenals/Urinary Tract: No adrenal nodule. No hydronephrosis or perinephric edema. Homogeneous renal enhancement with symmetric excretion on delayed phase imaging. Multiple low-density lesions in the right greater than left kidney are likely cyst, some of which are too small to characterize. Urinary bladder is physiologically distended without wall thickening. Stomach/Bowel: Prominent gastric distention with ingested material. There is normal positioning of the ligament of Treitz, however small bowel than courses in the right abdomen. No associated bowel wall thickening, inflammation, or obstruction. Colon is normally positioned. Appendix surgically absent per history. No pericecal inflammation. Multifocal colonic diverticulosis. No diverticulitis. Mild sigmoid colonic redundancy. Vascular/Lymphatic: Normal caliber abdominal aorta. Portal vein and mesenteric vessels are patent. No adenopathy. Reproductive: Prostate is unremarkable. Other: No free air, free fluid, or intra-abdominal fluid collection. Small fat containing umbilical hernia Musculoskeletal: There are no acute or suspicious osseous abnormalities. Degenerative change in the spine. IMPRESSION: 1. Gallstones with possible gallbladder wall thickening. Recommend right upper quadrant ultrasound for further evaluation. 2. Marked gastric distention with ingested material, raising concern for gastroparesis or delayed gastric emptying. 3. Normal positioning of the ligament of Treitz,  however proximal jejunum then courses abnormally into the right abdomen. It is unclear whether this is secondary to displacement related to distended stomach versus partial small bowel malrotation. No associated bowel inflammation or obstruction. 4. Colonic diverticulosis without diverticulitis.  Electronically Signed   By: Keith Rake M.D.   On: 08/04/2018 02:42   Dg Chest Port 1 View  Result Date: 08/04/2018 CLINICAL DATA:  Shortness of breath. Fever. Chills. 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 Unchanged heart size and mediastinal contours. No pulmonary edema, focal airspace disease, pleural effusion or pneumothorax. IMPRESSION: No acute chest finding. Electronically Signed   By: Keith Rake M.D.   On: 08/04/2018 01:33   US Abdomen Limited Ruq  Result Date: 08/04/2018 CLINICAL DATA:  Ascending cholangitis EXAM: ULTRASOUND ABDOMEN LIMITED RIGHT UPPER QUADRANT COMPARISON:  08/04/2018 CT abdomen/pelvis FINDINGS: Gallbladder: No gallbladder distention. Ring down artifact in the nondependent gallbladder wall indicative of adenomyomatosis. Layering subcentimeter shadowing gallstones. No sonographic Murphy sign. No pericholecystic fluid. Common bile duct: Diameter: 5 mm Liver: No focal lesion identified. Within normal limits in parenchymal echogenicity. Portal vein is patent on color Doppler imaging with normal direction of blood flow towards the liver. IMPRESSION: 1. Cholelithiasis.  No sonographic evidence of acute cholecystitis. 2. Findings of adenomyomatosis of the gallbladder. 3. No biliary ductal dilatation. 4. Normal liver. Electronically Signed   By: Ilona Sorrel M.D.   On: 08/04/2018 05:25   Scheduled Meds:  diltiazem  240 mg Oral Daily   pantoprazole (PROTONIX) IV  40 mg Intravenous Q12H   Continuous Infusions:  sodium chloride 100 mL/hr at 08/05/18 0645   heparin 1,100 Units/hr (08/05/18 0640)    piperacillin-tazobactam (ZOSYN)  IV 3.375 g (08/05/18 8127)    LOS: 1 day   Kerney Elbe, DO [facility] PAGER is on AMION  If 7PM-7AM, please contact night-coverage [email or web address] Password [facility] 08/05/2018, 12:52 PM

## 2018-08-05 NOTE — Progress Notes (Signed)
[place] for heparin Indication: atrial fibrillation  Allergies  Allergen Reactions  . Benazepril Anaphylaxis  . Shellfish Allergy Swelling  . Vicodin [Hydrocodone-Acetaminophen] Other (See Comments)    Dizzy    Patient Measurements: Height: 5\' 8"  (172.7 cm) Weight: 173 lb (78.5 kg) IBW/kg (Calculated) : 68.4 Heparin Dosing Weight = TBW = 78 kg  Vital Signs: Temp: 99.3 F (37.4 C) (06/21 2301) Temp Source: Oral (06/21 2301) BP: 112/78 (06/21 2301) Pulse Rate: 60 (06/21 2301)  Labs: Recent Labs    08/04/18 0033 08/04/18 0102  08/04/18 0818  08/05/18 0353 08/05/18 1150 08/05/18 2300  HGB 12.7* 13.3  --   --   --  10.8*  --   --   HCT 38.9* 39.0  --   --   --  33.1*  --   --   PLT 178  --   --   --   --  138*  --   --   APTT  --   --   --  35   < > 75* 52* 59*  LABPROT  --   --   --  14.8  --   --   --   --   INR  --   --   --  1.2  --   --   --   --   HEPARINUNFRC  --   --    < > 1.02*  --  0.50 0.30 0.24*  CREATININE 1.65*  --   --   --   --  1.30*  --   --    < > = values in this interval not displayed.    Estimated Creatinine Clearance: 46 mL/min (A) (by C-G formula based on SCr of 1.3 mg/dL (H)).  Assessment: Patient admitted with abdominal pain - surgery consulted. Planning for possible surgical intervention on Monday. Pt was taking apixaban 5 mg PO BID PTA for atrial fibrillation. Anticoagulation being converted to heparin drip in anticipation of surgery.  Last dose of apixaban: 6/19 @ 2200  08/05/2018  APTT subtherapeutic (59 sec) after heparin infusion increased to 1250 units/hr. Heparin level subtherapeutic (0.24) as well.  No bleeding or infusion related issues reported by RN  Goal of Therapy:  Heparin level 0.3-0.7 units/ml APTT 66-102 seconds Monitor platelets by anticoagulation protocol: Yes   Plan:   Increase heparin infusion to 1400 units/hr  Check  APTT and HL in 8 hours  Monitor using aPTT until  aPTT and HL correlate   CBC and HL/aPTT daily while on heparin  Monitor for transition back to apixaban post-op  Eudelia Bunch, Pharm.D [phone] 08/05/2018 11:54 PM

## 2018-08-05 NOTE — Progress Notes (Signed)
[place] for heparin Indication: atrial fibrillation  Allergies  Allergen Reactions  . Benazepril Anaphylaxis  . Shellfish Allergy Swelling  . Vicodin [Hydrocodone-Acetaminophen] Other (See Comments)    Dizzy    Patient Measurements: Height: 5\' 8"  (172.7 cm) Weight: 173 lb (78.5 kg) IBW/kg (Calculated) : 68.4 Heparin Dosing Weight = TBW = 78 kg  Vital Signs: Temp: 98.8 F (37.1 C) (06/20 1956) Temp Source: Oral (06/20 1956) BP: 120/76 (06/20 1956) Pulse Rate: 69 (06/20 1956)  Labs: Recent Labs    08/04/18 0033 08/04/18 0102 08/04/18 0818 08/04/18 1845 08/05/18 0353  HGB 12.7* 13.3  --   --  10.8*  HCT 38.9* 39.0  --   --  33.1*  PLT 178  --   --   --  138*  APTT  --   --  35 48* 75*  LABPROT  --   --  14.8  --   --   INR  --   --  1.2  --   --   HEPARINUNFRC  --   --  1.02*  --  0.50  CREATININE 1.65*  --   --   --  1.30*    Estimated Creatinine Clearance: 46 mL/min (A) (by C-G formula based on SCr of 1.3 mg/dL (H)).  Assessment: Patient admitted with abdominal pain - surgery consulted. Planning for possible surgical intervention on Monday. Pt was taking apixaban 5 mg PO BID PTA for atrial fibrillation. Anticoagulation being converted to heparin drip in anticipation of surgery.  Last dose of apixaban: 6/19 @ 2200  08/05/2018 APTT therapeutic at 75 sec on heparin infusion at 1100 units/hr.  Heparin level also therapeutic at 0.5.  Hg 13.3>10.8, Plct 178>138.  No bleeding reported.    Goal of Therapy:  Heparin level 0.3-0.7 units/ml APTT 66-102 seconds Monitor platelets by anticoagulation protocol: Yes   Plan:   continue heparin infusion at 1100 units/hr  Check confirmatory aPTT in 8 hours  Monitor using aPTT until aPTT and HL correlate   CBC and HL/aPTT daily while on heparin  Monitor for transition back to apixaban post-op  Eudelia Bunch, Pharm.D [phone] 08/05/2018 4:40 AM

## 2018-08-06 ENCOUNTER — Encounter (HOSPITAL_COMMUNITY): Payer: Self-pay | Admitting: Anesthesiology

## 2018-08-06 ENCOUNTER — Encounter (HOSPITAL_COMMUNITY)
Admission: EM | Disposition: A | Discharge status: Home / Self Care | Payer: Self-pay | Source: Home / Self Care | Attending: Internal Medicine

## 2018-08-06 ENCOUNTER — Other Ambulatory Visit: Payer: Self-pay

## 2018-08-06 ENCOUNTER — Inpatient Hospital Stay (HOSPITAL_COMMUNITY): Payer: HMO | Admitting: Anesthesiology

## 2018-08-06 ENCOUNTER — Inpatient Hospital Stay (HOSPITAL_COMMUNITY): Payer: HMO

## 2018-08-06 DIAGNOSIS — R945 Abnormal results of liver function studies: Secondary | ICD-10-CM

## 2018-08-06 HISTORY — PX: CHOLECYSTECTOMY: SHX55

## 2018-08-06 LAB — FOLATE: Folate: 8.9 ng/mL (ref >5.9)

## 2018-08-06 LAB — COMPREHENSIVE METABOLIC PANEL
ALT: 64 U/L — ABNORMAL HIGH (ref 0–44)
AST: 31 U/L (ref 15–41)
Albumin: 2.6 g/dL — ABNORMAL LOW (ref 3.5–5.0)
Alkaline Phosphatase: 136 U/L — ABNORMAL HIGH (ref 38–126)
Anion gap: 10 (ref 5–15)
BUN: 13 mg/dL (ref 8–23)
CO2: 21 mmol/L — ABNORMAL LOW (ref 22–32)
Calcium: 9 mg/dL (ref 8.9–10.3)
Chloride: 107 mmol/L (ref 98–111)
Creatinine, Ser: 1.17 mg/dL (ref 0.61–1.24)
GFR calc Af Amer: >60 mL/min (ref >60)
GFR calc non Af Amer: 60 mL/min — ABNORMAL LOW (ref >60)
Glucose, Bld: 92 mg/dL (ref 70–99)
Potassium: 3.9 mmol/L (ref 3.5–5.1)
Sodium: 138 mmol/L (ref 135–145)
Total Bilirubin: 1.5 mg/dL — ABNORMAL HIGH (ref 0.3–1.2)
Total Protein: 6.4 g/dL — ABNORMAL LOW (ref 6.5–8.1)

## 2018-08-06 LAB — CBC WITH DIFFERENTIAL/PLATELET
Abs Immature Granulocytes: 0.07 10*3/uL (ref 0.00–0.07)
Basophils Absolute: 0 10*3/uL (ref 0.0–0.1)
Basophils Relative: 0 %
Eosinophils Absolute: 0.1 10*3/uL (ref 0.0–0.5)
Eosinophils Relative: 2 %
HCT: 33.9 % — ABNORMAL LOW (ref 39.0–52.0)
Hemoglobin: 11 g/dL — ABNORMAL LOW (ref 13.0–17.0)
Immature Granulocytes: 1 %
Lymphocytes Relative: 15 %
Lymphs Abs: 1.1 10*3/uL (ref 0.7–4.0)
MCH: 33.3 pg (ref 26.0–34.0)
MCHC: 32.4 g/dL (ref 30.0–36.0)
MCV: 102.7 fL — ABNORMAL HIGH (ref 80.0–100.0)
Monocytes Absolute: 0.5 10*3/uL (ref 0.1–1.0)
Monocytes Relative: 6 %
Neutro Abs: 5.6 10*3/uL (ref 1.7–7.7)
Neutrophils Relative %: 76 %
Platelets: 146 10*3/uL — ABNORMAL LOW (ref 150–400)
RBC: 3.3 MIL/uL — ABNORMAL LOW (ref 4.22–5.81)
RDW: 14 % (ref 11.5–15.5)
WBC: 7.4 10*3/uL (ref 4.0–10.5)
nRBC: 0 % (ref 0.0–0.2)

## 2018-08-06 LAB — FERRITIN: Ferritin: 259 ng/mL (ref 24–336)

## 2018-08-06 LAB — GLUCOSE, CAPILLARY: Glucose-Capillary: 114 mg/dL — ABNORMAL HIGH (ref 70–99)

## 2018-08-06 LAB — CULTURE, BLOOD (ROUTINE X 2)
Special Requests: ADEQUATE
Special Requests: ADEQUATE

## 2018-08-06 LAB — PHOSPHORUS: Phosphorus: 2.7 mg/dL (ref 2.5–4.6)

## 2018-08-06 LAB — SURGICAL PCR SCREEN
MRSA, PCR: NEGATIVE
Staphylococcus aureus: NEGATIVE

## 2018-08-06 LAB — IRON AND TIBC
Iron: 91 ug/dL (ref 45–182)
Saturation Ratios: 42 % — ABNORMAL HIGH (ref 17.9–39.5)
TIBC: 218 ug/dL — ABNORMAL LOW (ref 250–450)
UIBC: 127 ug/dL

## 2018-08-06 LAB — RETICULOCYTES
Immature Retic Fract: 24.3 % — ABNORMAL HIGH (ref 2.3–15.9)
RBC.: 3.3 MIL/uL — ABNORMAL LOW (ref 4.22–5.81)
Retic Count, Absolute: 44.9 10*3/uL (ref 19.0–186.0)
Retic Ct Pct: 1.4 % (ref 0.4–3.1)

## 2018-08-06 LAB — MAGNESIUM: Magnesium: 1.6 mg/dL — ABNORMAL LOW (ref 1.7–2.4)

## 2018-08-06 LAB — HEPARIN LEVEL (UNFRACTIONATED): Heparin Unfractionated: 0.28 IU/mL — ABNORMAL LOW (ref 0.30–0.70)

## 2018-08-06 LAB — VITAMIN B12: Vitamin B-12: 348 pg/mL (ref 180–914)

## 2018-08-06 SURGERY — LAPAROSCOPIC CHOLECYSTECTOMY WITH INTRAOPERATIVE CHOLANGIOGRAM
Anesthesia: General | Site: Abdomen

## 2018-08-06 MED ORDER — LACTATED RINGERS IV SOLN
INTRAVENOUS | Status: AC | PRN
  Administered 2018-08-06: 1000 mL

## 2018-08-06 MED ORDER — ROCURONIUM BROMIDE 100 MG/10ML IV SOLN
INTRAVENOUS | Status: DC | PRN
  Administered 2018-08-06: 40 mg via INTRAVENOUS

## 2018-08-06 MED ORDER — ONDANSETRON HCL 4 MG/2ML IJ SOLN
INTRAMUSCULAR | Status: DC | PRN
  Administered 2018-08-06: 4 mg via INTRAVENOUS

## 2018-08-06 MED ORDER — SODIUM CHLORIDE 0.9 % IV SOLN: 2.0000 g | INTRAVENOUS | Status: DC

## 2018-08-06 MED ORDER — PHENYLEPHRINE 40 MCG/ML (10ML) SYRINGE FOR IV PUSH (FOR BLOOD PRESSURE SUPPORT)
PREFILLED_SYRINGE | INTRAVENOUS | Status: DC | PRN
  Administered 2018-08-06 (×8): 160 ug via INTRAVENOUS

## 2018-08-06 MED ORDER — PROPOFOL 10 MG/ML IV BOLUS
INTRAVENOUS | Status: AC
  Filled 2018-08-06: qty 20

## 2018-08-06 MED ORDER — SUGAMMADEX SODIUM 200 MG/2ML IV SOLN
INTRAVENOUS | Status: AC
  Filled 2018-08-06: qty 2

## 2018-08-06 MED ORDER — OXYCODONE HCL 5 MG/5ML PO SOLN: 5.0000 mg | Freq: Once | ORAL | Status: DC | PRN

## 2018-08-06 MED ORDER — LIDOCAINE 2% (20 MG/ML) 5 ML SYRINGE
INTRAMUSCULAR | Status: AC
  Filled 2018-08-06: qty 5

## 2018-08-06 MED ORDER — HEPARIN (PORCINE) 25000 UT/250ML-% IV SOLN
900.0000 [IU]/h | INTRAVENOUS | Status: DC
  Filled 2018-08-06: qty 250

## 2018-08-06 MED ORDER — BUPIVACAINE-EPINEPHRINE (PF) 0.25% -1:200000 IJ SOLN
INTRAMUSCULAR | Status: AC
  Filled 2018-08-06: qty 30

## 2018-08-06 MED ORDER — SUGAMMADEX SODIUM 200 MG/2ML IV SOLN
INTRAVENOUS | Status: DC | PRN
  Administered 2018-08-06: 150 mg via INTRAVENOUS

## 2018-08-06 MED ORDER — BUPIVACAINE-EPINEPHRINE 0.25% -1:200000 IJ SOLN
INTRAMUSCULAR | Status: DC | PRN
  Administered 2018-08-06: 30 mL

## 2018-08-06 MED ORDER — HEPARIN (PORCINE) 25000 UT/250ML-% IV SOLN: 1400.0000 [IU]/h | INTRAVENOUS | Status: DC

## 2018-08-06 MED ORDER — ONDANSETRON HCL 4 MG/2ML IJ SOLN: 4.0000 mg | Freq: Once | INTRAMUSCULAR | Status: DC | PRN

## 2018-08-06 MED ORDER — FENTANYL CITRATE (PF) 100 MCG/2ML IJ SOLN
INTRAMUSCULAR | Status: DC | PRN
  Administered 2018-08-06 (×2): 50 ug via INTRAVENOUS

## 2018-08-06 MED ORDER — SUCCINYLCHOLINE CHLORIDE 200 MG/10ML IV SOSY
PREFILLED_SYRINGE | INTRAVENOUS | Status: DC | PRN
  Administered 2018-08-06: 160 mg via INTRAVENOUS

## 2018-08-06 MED ORDER — DEXTROSE 5 % IV SOLN
INTRAVENOUS | Status: DC | PRN
  Administered 2018-08-06: 2 g via INTRAVENOUS

## 2018-08-06 MED ORDER — MAGNESIUM SULFATE 2 GM/50ML IV SOLN
2.0000 g | Freq: Once | INTRAVENOUS | Status: AC
  Administered 2018-08-06: 2 g via INTRAVENOUS
  Filled 2018-08-06: qty 50

## 2018-08-06 MED ORDER — ACETAMINOPHEN 325 MG PO TABS: 325.0000 mg | ORAL_TABLET | ORAL | Status: DC | PRN

## 2018-08-06 MED ORDER — LACTATED RINGERS IV SOLN
INTRAVENOUS | Status: DC | PRN
  Administered 2018-08-06: 12:00:00 via INTRAVENOUS

## 2018-08-06 MED ORDER — LIDOCAINE 2% (20 MG/ML) 5 ML SYRINGE
INTRAMUSCULAR | Status: DC | PRN
  Administered 2018-08-06: 60 mg via INTRAVENOUS

## 2018-08-06 MED ORDER — FENTANYL CITRATE (PF) 100 MCG/2ML IJ SOLN
INTRAMUSCULAR | Status: AC
  Filled 2018-08-06: qty 2

## 2018-08-06 MED ORDER — MEPERIDINE HCL 50 MG/ML IJ SOLN: 6.2500 mg | INTRAMUSCULAR | Status: DC | PRN

## 2018-08-06 MED ORDER — PROPOFOL 10 MG/ML IV BOLUS
INTRAVENOUS | Status: DC | PRN
  Administered 2018-08-06: 150 mg via INTRAVENOUS

## 2018-08-06 MED ORDER — ACETAMINOPHEN 160 MG/5ML PO SOLN: 325.0000 mg | ORAL | Status: DC | PRN

## 2018-08-06 MED ORDER — FENTANYL CITRATE (PF) 100 MCG/2ML IJ SOLN
25.0000 ug | INTRAMUSCULAR | Status: DC | PRN
  Administered 2018-08-06 (×2): 25 ug via INTRAVENOUS
  Administered 2018-08-06: 50 ug via INTRAVENOUS

## 2018-08-06 MED ORDER — FENTANYL CITRATE (PF) 250 MCG/5ML IJ SOLN
INTRAMUSCULAR | Status: AC
  Filled 2018-08-06: qty 5

## 2018-08-06 MED ORDER — IOPAMIDOL (ISOVUE-300) INJECTION 61%
INTRAVENOUS | Status: DC | PRN
  Administered 2018-08-06: 13 mL

## 2018-08-06 MED ORDER — OXYCODONE HCL 5 MG PO TABS: 5.0000 mg | ORAL_TABLET | Freq: Once | ORAL | Status: DC | PRN

## 2018-08-06 MED ORDER — ONDANSETRON HCL 4 MG/2ML IJ SOLN
INTRAMUSCULAR | Status: AC
  Filled 2018-08-06: qty 2

## 2018-08-06 MED ORDER — MUPIROCIN 2 % EX OINT
1.0000 "application " | TOPICAL_OINTMENT | Freq: Two times a day (BID) | CUTANEOUS | Status: DC
  Administered 2018-08-06 – 2018-08-08 (×6): 1 via NASAL
  Filled 2018-08-06 (×2): qty 22

## 2018-08-06 MED ORDER — MORPHINE SULFATE (PF) 2 MG/ML IV SOLN
2.0000 mg | INTRAVENOUS | Status: DC | PRN
  Administered 2018-08-06 – 2018-08-07 (×3): 2 mg via INTRAVENOUS
  Filled 2018-08-06 (×2): qty 1

## 2018-08-06 MED ORDER — PHENYLEPHRINE 40 MCG/ML (10ML) SYRINGE FOR IV PUSH (FOR BLOOD PRESSURE SUPPORT)
PREFILLED_SYRINGE | INTRAVENOUS | Status: AC
  Filled 2018-08-06: qty 20

## 2018-08-06 SURGICAL SUPPLY — 30 items
APPLIER CLIP 5 13 M/L LIGAMAX5 (MISCELLANEOUS) ×3
CABLE HIGH FREQUENCY MONO STRZ (ELECTRODE) ×3 IMPLANT
CATH REDDICK CHOLANGI 4FR 50CM (CATHETERS) ×3 IMPLANT
CHLORAPREP W/TINT 26 (MISCELLANEOUS) ×3 IMPLANT
CLIP APPLIE 5 13 M/L LIGAMAX5 (MISCELLANEOUS) ×1 IMPLANT
COVER MAYO STAND STRL (DRAPES) ×3 IMPLANT
COVER WAND RF STERILE (DRAPES) IMPLANT
DECANTER SPIKE VIAL GLASS SM (MISCELLANEOUS) ×3 IMPLANT
DERMABOND ADVANCED (GAUZE/BANDAGES/DRESSINGS) ×2
DERMABOND ADVANCED .7 DNX12 (GAUZE/BANDAGES/DRESSINGS) ×1 IMPLANT
DRAPE C-ARM 42X120 X-RAY (DRAPES) ×3 IMPLANT
ELECT REM PT RETURN 15FT ADLT (MISCELLANEOUS) ×3 IMPLANT
GLOVE BIO SURGEON STRL SZ7.5 (GLOVE) ×3 IMPLANT
GOWN STRL REUS W/TWL XL LVL3 (GOWN DISPOSABLE) ×9 IMPLANT
HEMOSTAT SNOW SURGICEL 2X4 (HEMOSTASIS) ×2 IMPLANT
HEMOSTAT SURGICEL 4X8 (HEMOSTASIS) IMPLANT
IV CATH 14GX2 1/4 (CATHETERS) ×3 IMPLANT
KIT BASIN OR (CUSTOM PROCEDURE TRAY) ×3 IMPLANT
KIT TURNOVER KIT A (KITS) IMPLANT
POUCH SPECIMEN RETRIEVAL 10MM (ENDOMECHANICALS) ×3 IMPLANT
SCISSORS LAP 5X35 DISP (ENDOMECHANICALS) ×3 IMPLANT
SET IRRIG TUBING LAPAROSCOPIC (IRRIGATION / IRRIGATOR) ×3 IMPLANT
SET TUBE SMOKE EVAC HIGH FLOW (TUBING) ×3 IMPLANT
SLEEVE XCEL OPT CAN 5 100 (ENDOMECHANICALS) ×8 IMPLANT
SUT MNCRL AB 4-0 PS2 18 (SUTURE) ×3 IMPLANT
TOWEL OR 17X26 10 PK STRL BLUE (TOWEL DISPOSABLE) ×3 IMPLANT
TOWEL OR NON WOVEN STRL DISP B (DISPOSABLE) ×3 IMPLANT
TRAY LAPAROSCOPIC (CUSTOM PROCEDURE TRAY) ×3 IMPLANT
TROCAR BLADELESS OPT 5 100 (ENDOMECHANICALS) ×3 IMPLANT
TROCAR XCEL BLUNT TIP 100MML (ENDOMECHANICALS) ×3 IMPLANT

## 2018-08-06 NOTE — Progress Notes (Addendum)
Patient ID: Derek Curry, male   DOB: 05/11/1940, 78 y.o.   MRN: [id number]    Progress Note   Subjective  Patient is status post laparoscopic cholecystectomy today,  IOC per radiologist read as probable filling defects in the distal CBD and lack of contrast entering the duodenum likely reflecting choledocholithiasis  On Zosyn LFTs improved today, T bili 1.5/alk phos 136/ALT 64  BC + Ecoli/ Enterobacter  Eliquis has been on hold  He is sore  Post op , no severe pain     Objective   Vital signs in last 24 hours: Temp:  [97.5 F (36.4 C)-99.3 F (37.4 C)] 97.7 F (36.5 C) (06/22 1557) Pulse Rate:  [58-60] 59 (06/22 1557) Resp:  [12-20] 16 (06/22 1530) BP: (112-124)/(60-82) 124/70 (06/22 1557) SpO2:  [96 %-100 %] 100 % (06/22 1557) Last BM Date: 08/05/18 General: elderly    AA male  in NAD Heart:  Regular rate and rhythm; no murmurs Lungs: Respirations even and unlabored, lungs CTA bilaterally Abdomen:  Soft, tender  Upper abdomen,and nondistended.  Decreased BS. Extremities:  Without edema. Neurologic:  Alert and oriented,  grossly normal neurologically. Psych:  Cooperative. Normal mood and affect.  Intake/Output from previous day: 06/21 0701 - 06/22 0700 In: 3148.6 [P.O.:600; I.V.:2345.5; IV Piggyback:203.1] Out: 1855 [Urine:1855] Intake/Output this shift: Total I/O In: 1469.5 [I.V.:1419.5; IV Piggyback:50] Out: 450 [Urine:400; Blood:50]  Lab Results: Recent Labs    08/04/18 0033 08/04/18 0102 08/05/18 0353 08/06/18 0734  WBC 8.4  --  10.1 7.4  HGB 12.7* 13.3 10.8* 11.0*  HCT 38.9* 39.0 33.1* 33.9*  PLT 178  --  138* 146*   BMET Recent Labs    08/04/18 0033 08/04/18 0102 08/05/18 0353 08/06/18 0734  NA 137 138 139 138  K 4.0 4.8 3.5 3.9  CL 102  --  108 107  CO2 23  --  22 21*  GLUCOSE 137*  --  116* 92  BUN 23  --  19 13  CREATININE 1.65*  --  1.30* 1.17  CALCIUM 9.6  --  8.4* 9.0   LFT Recent Labs    08/06/18 0734  PROT 6.4*   ALBUMIN 2.6*  AST 31  ALT 64*  ALKPHOS 136*  BILITOT 1.5*   PT/INR Recent Labs    08/04/18 0818  LABPROT 14.8  INR 1.2    Studies/Results: Dg Cholangiogram Operative  Result Date: 08/06/2018 CLINICAL DATA:  Cholecystectomy for cholelithiasis. Abnormal liver function tests. EXAM: INTRAOPERATIVE CHOLANGIOGRAM TECHNIQUE: Cholangiographic images from the C-arm fluoroscopic device were submitted for interpretation post-operatively. Please see the procedural report for the amount of contrast and the fluoroscopy time utilized. COMPARISON:  Ultrasound and CT studies on 08/04/2018 FINDINGS: Intraoperative imaging demonstrates sluggish flow of contrast through the distal CBD with suggestion of probable filling defects in the distal CBD. No contrast is seen to enter the duodenum. No contrast extravasation. IMPRESSION: Intraoperative cholangiogram demonstrates probable filling defects in the distal CBD and lack of contrast entering the duodenum. Findings likely reflect choledocholithiasis. Electronically Signed   By: Glenn  Yamagata M.D.   On: 08/06/2018 14:29       Assessment / Plan:     #1 78-year-old white male presenting with epigastric pain and elevated LFTs -found to have E. coli/Enterobacter bacteremia likely secondary to early cholangitis/cholecystitis.  Patient is status post cholecystectomy today. IOC is positive-probable filling defects in the distal CBD consistent with choledocholithiasis.  LFTs have been improving, WBC normal.  Patient is on Zosyn.  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 Marleena Shubert in a.m. tomorrow 08/07/2018.  Procedure has   been discussed in detail with the patient including indications risks and benefits and he is agreeable to proceed.  We specifically discussed  5% risk of pancreatitis  Continue IV Zosyn  Clear liquids this evening then n.p.o. after midnight  Labs in a.m.  Patient had order for IV heparin to start at 6 AM tomorrow.  I discontinued this order -patient having ERCP with stone extraction tomorrow.  Heparin versus restarting Eliquis will need to be sorted out per pharmacy/hospitalist post ERCP.     Principal Problem:   Elevated liver enzymes Active Problems:   Paroxysmal A-fib (HCC)   Acute abdominal pain   AKI (acute kidney injury) (HCC)   Gallstones     LOS: 2 days   [name]  08/06/2018, 3:58 PM     Attending physician's note   I have taken an interval history, reviewed the chart and examined the patient. I agree with the Advanced Practitioner's note, impression and recommendations.   IOC positive for CBD stones. LFTs improving.  On Zosyn.  Eliquis on hold. BC were positive for E. Coli/Enterobacter likely d/t acute cholecystitis.  Doubt ascending cholangitis.  Plan: -Continue antibiotics. -ERCP in AM.  I have explained the procedure, risks and benefits including small but definite risks of pancreatitis, perforation, bleeding and risks of anesthesia.  Benefits were also explained.  He wishes to proceed.  Raj Earley Grobe, MD [facility] [phone].   

## 2018-08-06 NOTE — Patient Outreach (Signed)
  [facility]) Care Management Chronic Special Needs Program    08/06/2018  Name: Derek Curry, DOB: 1940-09-11  MRN: [id number]   [name] is enrolled in a chronic special needs plan for Diabetes.  Client admitted to [facility] on 08/04/18 with dx for abd pain,  Acute cholecystitis Individualized care plan sent to [facility] for admission   Peter Garter RN, [facility], [place] Management [phone]

## 2018-08-06 NOTE — Plan of Care (Signed)
  Problem: Clinical Measurements: Goal: Ability to maintain clinical measurements within normal limits will improve Outcome: Completed/Met Goal: Respiratory complications will improve Outcome: Progressing   Problem: Activity: Goal: Risk for activity intolerance will decrease Outcome: Progressing   Problem: Nutrition: Goal: Adequate nutrition will be maintained Outcome: Progressing   Problem: Coping: Goal: Level of anxiety will decrease Outcome: Progressing

## 2018-08-06 NOTE — Progress Notes (Signed)
   Subjective/Chief Complaint: No complaints   Objective: Vital signs in last 24 hours: Temp:  [98.2 F (36.8 C)-99.3 F (37.4 C)] 98.4 F (36.9 C) (06/22 0554) Pulse Rate:  [59-60] 59 (06/22 0554) Resp:  [16-20] 20 (06/22 0554) BP: (112-118)/(60-82) 118/82 (06/22 0554) SpO2:  [98 %-100 %] 100 % (06/22 0554) Last BM Date: 08/03/18  Intake/Output from previous day: 06/21 0701 - 06/22 0700 In: 3148.6 [P.O.:600; I.V.:2345.5; IV Piggyback:203.1] Out: 1855 [Urine:1855] Intake/Output this shift: Total I/O In: 38.1 [I.V.:38.1] Out: -   General appearance: alert and cooperative Resp: clear to auscultation bilaterally Cardio: regular rate and rhythm GI: soft, non-tender; bowel sounds normal; no masses,  no organomegaly  Lab Results:  Recent Labs    08/04/18 0033 08/04/18 0102 08/05/18 0353  WBC 8.4  --  10.1  HGB 12.7* 13.3 10.8*  HCT 38.9* 39.0 33.1*  PLT 178  --  138*   BMET Recent Labs    08/04/18 0033 08/04/18 0102 08/05/18 0353  NA 137 138 139  K 4.0 4.8 3.5  CL 102  --  108  CO2 23  --  22  GLUCOSE 137*  --  116*  BUN 23  --  19  CREATININE 1.65*  --  1.30*  CALCIUM 9.6  --  8.4*   PT/INR Recent Labs    08/04/18 0818  LABPROT 14.8  INR 1.2   ABG Recent Labs    08/04/18 0102  HCO3 29.4*    Studies/Results: No results found.  Anti-infectives: Anti-infectives (From admission, onward)   Start     Dose/Rate Route Frequency Ordered Stop   08/06/18 0745  cefTRIAXone (ROCEPHIN) 2 g in sodium chloride 0.9 % 100 mL IVPB  Status:  Discontinued     2 g 200 mL/hr over 30 Minutes Intravenous On call to O.R. 08/06/18 0740 08/06/18 0740   08/04/18 1400  piperacillin-tazobactam (ZOSYN) IVPB 3.375 g     3.375 g 12.5 mL/hr over 240 Minutes Intravenous Every 8 hours 08/04/18 0746     08/04/18 0445  piperacillin-tazobactam (ZOSYN) IVPB 3.375 g     3.375 g 100 mL/hr over 30 Minutes Intravenous  Once 08/04/18 0430 08/04/18 [id number]       Assessment/Plan: s/p Procedure(s): LAPAROSCOPIC CHOLECYSTECTOMY WITH INTRAOPERATIVE CHOLANGIOGRAM (N/A) plan for lap chole with ioc today  Risks and benefits of the surgery as well as some of the technical aspects discussed with patient and he understands and wishes to proceed  LOS: 2 days    Autumn Messing III 08/06/2018

## 2018-08-06 NOTE — Anesthesia Postprocedure Evaluation (Signed)
Anesthesia Post Note  Patient: Kaushal Vannice  Procedure(s) Performed: LAPAROSCOPIC CHOLECYSTECTOMY WITH INTRAOPERATIVE CHOLANGIOGRAM (N/A Abdomen)     Patient location during evaluation: PACU Anesthesia Type: General Level of consciousness: awake and alert Pain management: pain level controlled Vital Signs Assessment: post-procedure vital signs reviewed and stable Respiratory status: spontaneous breathing, nonlabored ventilation, respiratory function stable and patient connected to nasal cannula oxygen Cardiovascular status: blood pressure returned to baseline and stable Postop Assessment: no apparent nausea or vomiting Anesthetic complications: no    Last Vitals:  Vitals:   08/06/18 1530 08/06/18 1557  BP: 117/70 124/70  Pulse: (!) 58 (!) 59  Resp: 16   Temp: (!) 36.4 C 36.5 C  SpO2: 100% 100%    Last Pain:  Vitals:   08/06/18 1557  TempSrc: Oral  PainSc:                  Dantre Yearwood

## 2018-08-06 NOTE — Progress Notes (Signed)
PROGRESS NOTE    Derek Curry  WUJ:[id number] DOB: [birth year] DOA: 08/04/2018 PCP: Jefm Petty, MD  Brief Narrative:  HPI per Dr. Shelly Coss on 08/04/2018 Derek Curry is a 78 y.o. male  with hx of  DM, HTN,  Afib,Gout  who  presents with acute abdominal pain after a fatty meal at around 6 pm yesterday .  Patient has been having pain on his right side of the abdomen on and off for last 1 to 2 weeks.  Pain was unbearable and severe yesterday evening so he decided to come to [place].  Patient describes the pain as dull and rates as 8/10.  It became worse with movement.  Patient also reported of having subjective fevers at home with chills.  He denied any nausea, vomiting or diarrhea.  His bowel movements have been normal.  He also complained of shortness of breath when lying flat in the bed. Patient is physically active  and is actively working as a Curator.  He  has history of atrial fibrillation and is on Eliquis at home and follows with Dr. Minna Merritts at [facility]. Patient seen and examined the bedside.  Currently he is hemodynamically stable.  Denies any abdominal pain while on rest. Denies any chest pain, cough, dysuria.   ED Course: Found to be tachypneic on arrival.  Noted to have fever of 102 Fahrenheit when checked rectally.  Satting in the range of low 90s.  CT abdomen/pelvis showed gallstones with gallbladder wall thickening, possible gastroparesis.  Liver enzymes were elevated.  Clinical picture suggestive of acute cholecystitis.  Kept n.p.o.  Started on antibiotics.  General surgery consulted.  **Interim History  General Surgery and Gastroenterology evaluated and they are recommending a cholecystectomy with IOC.  LFTs are trending down abd likely has passed a gallstone.  Blood cultures grew out E. coli and likely has a bacteremia from a GI origin so continue IV Zosyn and narrow antibiotics based on sensitivities.  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 Obstructive Jaundice with concern for Choledocholithiasis and Ascending Cholangitis, improving  -CT abdomen/pelvis showed "Gallstones with possible gallbladder wall thickening. Marked gastric distention with ingested material, raising concern for gastroparesis or delayed gastric emptying. Normal positioning of the ligament of Treitz, however proximal jejunum then courses abnormally into the right abdomen. It is unclear whether this is secondary to displacement related to distended stomach versus partial small bowel malrotation. No associated bowel inflammation or obstruction." -RUQ U/S showed "Cholelithiasis.  No sonographic evidence of acute cholecystitis. Findings of adenomyomatosis of the gallbladder. No biliary ductal dilatation.  Normal liver."  -Presented with right-sided abdominal pain.  No nausea vomiting or diarrhea.  Also had fever, chills at home.  -Unable to Do MRCP due to Pacer -GI recommending that since his abdominal pain is resolving ALT is improved and that he may have passed a common bile duct stone recommending a lap chole with  IOC if IOC is positive and they are open to recommend an ERCP -General surgery planning for cholecystectomy with IOC today  -Continue to hold Eliquis and continue with pain management with IV Fentanyl 25-50 mcg q2hprn Moderate Pain and IV Morphine 2 mg every 4 PRN for severe pain and continue symptomatic treatment with antiemetics with 4 mg of IV Zofran every 6h as needed for nausea vomiting  Acute Kidney Injury -Patient denies any history of CKD but suspect that he may have some.   -His Creatinine on 05/2013 was 2.9 but probably that was just a transient episode.  -BUN/Cr went from 23/1.65 -> 19/1.30 -> 13/1.17 -Continue normal saline at a rate of 100 mL's per hour -Avoid nephrotoxic medications if possible, contrast dyes, as well as hypotension -Continue monitor trend renal function and repeat CMP in a.m.  Abnormal LFT's/Elevated Liver Enzymes, improving  Hyperbilirubinemia, improving  -Secondary to Obstructive Jaundice and Suspected Ascending Cholangitis -AST went from 244 -> 81 -> 31and ALT went from 132 -> 102 -> 64 -Denies heavy alcohol abuse, is a social drinker.   -Mildly elevated bilirubin at 2.8 but is worsened to 3.0 but is now improved to 1.5 -Really unable to do MRCP so GI is recommending general surgery do a cholecystectomy with IOC and if IOC is positive then they will do a ERCP  Normal CBD as per ultrasound.  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  Paroxysmal A. Fib -Currently heart rate is controlled.  He follows with electrophysiologist Dr. Minna Merritts at [facility] for anticoagulation at home.  Eliquis will be held for possible surgical intervention.   -Started on Heparin gtt and will continue with Pharmacy to Dose -On Cardizem 240 mg p.o. daily for rate control  Hypertension -On Lisinopril-Hydrochlorothiazide at home.   -Currently blood pressure stable and was 118/82 -Antihypertensives on hold due to acute kidney injury.   -Continue IV  Hydralazine 10 mg q6h PRN meds as needed.  Gout -On Allopurinol at home but currently holding   Diabetes mellitus -Not on medication at home.  On dietary modification.  Hyperlipidemia -On statin at home.   -Will hold because of elevated liver enzymes.  GERD -C/w Pantoprazole 40 mg IV q12h  Gastric Distention and Retention noted on CT -Clear Liquid Diet and Eventual EGD  -Will need to r/o Gastroparesis and Partial Gastric Outlet Obstruction  Macrocytic Anemia -Patient's Hb/Hct went from 13.3/39.0 -> 10.8/33.1 -> 11.0/33.9 -Check FOBT -Check Anemia Panel and showed iron level 91, TIBC of 127, TIBC of 218, saturation ratios of 42%, ferritin level of 259, folate level of 8.9, and vitamin B12 level of 340 -Continue to Monitor for S/Sx of Bleeding as patient is anticoagulated with Heparin gtt; Currently no Overt Bleeding Noted -Repeat CBC in AM   Thrombocytopenia -Patient's Platelet Count is now 138,000 -Continue to [place] for S/Sx of Bleeding -Repeat CBC in AM   Hypomagnesemia -Patient's magnesium level is 1.6 this morning Replete with IV mag sulfate 2 g -Need to monitor and replete as necessary Repeat magnesium level in a.m.  DVT prophylaxis: Anticoagulated with Heparin gtt Code Status: FULL CODE  Family Communication: No family  present at bedside  Disposition Plan:   Consultants:   General Surgery  Gastroenterology   Procedures:  None   Antimicrobials:  Anti-infectives (From admission, onward)   Start     Dose/Rate Route Frequency Ordered Stop   08/06/18 0745  cefTRIAXone (ROCEPHIN) 2 g in sodium chloride 0.9 % 100 mL IVPB  Status:  Discontinued     2 g 200 mL/hr over 30 Minutes Intravenous On call to O.R. 08/06/18 0740 08/06/18 0740   08/04/18 1400  [MAR Hold]  piperacillin-tazobactam (ZOSYN) IVPB 3.375 g     (MAR Hold since Mon 08/06/2018 at 1136.Hold Reason: Transfer to a Procedural area.)   3.375 g 12.5 mL/hr over 240 Minutes Intravenous Every 8  hours 08/04/18 0746     08/04/18 0445  piperacillin-tazobactam (ZOSYN) IVPB 3.375 g     3.375 g 100 mL/hr over 30 Minutes Intravenous  Once 08/04/18 0430 08/04/18 3825     Subjective: Seen and examined at bedside patient was doing great and states that he had no abdominal pain.  Awaiting his surgical intervention later this afternoon.  No nausea or vomiting.  Denies chest pain, lightheadedness or dizziness.  No other concerns or complaints at this time.  Objective: Vitals:   08/05/18 0507 08/05/18 1600 08/05/18 2301 08/06/18 0554  BP: (!) 113/57 115/60 112/78 118/82  Pulse: 64 60 60 (!) 59  Resp: 19 16 20 20   Temp: 98.3 F (36.8 C) 98.2 F (36.8 C) 99.3 F (37.4 C) 98.4 F (36.9 C)  TempSrc: Oral Oral Oral Oral  SpO2: 97% 98% 100% 100%  Weight:      Height:        Intake/Output Summary (Last 24 hours) at 08/06/2018 1151 Last data filed at 08/06/2018 1125 Gross per 24 hour  Intake 2586.58 ml  Output 2255 ml  Net 331.58 ml   Filed Weights   08/04/18 0024  Weight: 78.5 kg   Examination: Physical Exam:  Constitutional: Well-nourished, well-developed overweight African-American male currently no acute distress appears calm and comfortable awaiting surgical evaluation today Eyes: Lids and conjunctive are normal.  Sclera and is not icteric. ENMT: External ears and nose appear normal.  Grossly normal hearing Neck: Appears supple with no JVD Respiratory: Diminished auscultation bilaterally no patient wheezing, rales, rhonchi.  Patient not tachypneic or using any accessory muscles to breathe Cardiovascular: Regular rate and rhythm.  No appreciable murmurs, rubs, gallops.  Mild lower extremity edema Abdomen: Soft, nontender, distended secondary to body habitus mildly.  Bowel sounds present GU: Deferred Musculoskeletal: No contractures or cyanosis.  No joint deformities in upper and lower extremity Skin: No appreciable rashes or lesions on physical evaluation.   Neurologic:  Cranial nerves II through XII grossly intact no appreciable focal deficits.  Romberg sign cerebellar reflexes were not assessed Psychiatric: Normal judgment and insight.  Patient is awake, alert, oriented x3.  Has a pleasant mood and affect  Data Reviewed: I have personally reviewed following labs and imaging studies  CBC: Recent Labs  Lab 08/04/18 0033 08/04/18 0102 08/05/18 0353 08/06/18 0734  WBC 8.4  --  10.1 7.4  NEUTROABS 7.1  --   --  5.6  HGB 12.7* 13.3 10.8* 11.0*  HCT 38.9* 39.0 33.1* 33.9*  MCV 102.9*  --  104.1* 102.7*  PLT 178  --  138* 053*   Basic Metabolic Panel: Recent Labs  Lab 08/04/18 0033 08/04/18 0102 08/05/18 0353 08/06/18 0734  NA 137 138 139 138  K 4.0 4.8 3.5 3.9  CL  102  --  108 107  CO2 23  --  22 21*  GLUCOSE 137*  --  116* 92  BUN 23  --  19 13  CREATININE 1.65*  --  1.30* 1.17  CALCIUM 9.6  --  8.4* 9.0  MG  --   --   --  1.6*  PHOS  --   --   --  2.7   GFR: Estimated Creatinine Clearance: 51.2 mL/min (by C-G formula based on SCr of 1.17 mg/dL). Liver Function Tests: Recent Labs  Lab 08/04/18 0033 08/05/18 0353 08/06/18 0734  AST 244* 81* 31  ALT 132* 102* 64*  ALKPHOS 249* 162* 136*  BILITOT 2.8* 3.0* 1.5*  PROT 8.3* 6.2* 6.4*  ALBUMIN 3.6 2.7* 2.6*   Recent Labs  Lab 08/04/18 0033  LIPASE 31   No results for input(s): AMMONIA in the last 168 hours. Coagulation Profile: Recent Labs  Lab 08/04/18 0818  INR 1.2   Cardiac Enzymes: No results for input(s): CKTOTAL, CKMB, CKMBINDEX, TROPONINI in the last 168 hours. BNP (last 3 results) No results for input(s): PROBNP in the last 8760 hours. HbA1C: No results for input(s): HGBA1C in the last 72 hours. CBG: No results for input(s): GLUCAP in the last 168 hours. Lipid Profile: No results for input(s): CHOL, HDL, LDLCALC, TRIG, CHOLHDL, LDLDIRECT in the last 72 hours. Thyroid Function Tests: No results for input(s): TSH, T4TOTAL, FREET4, T3FREE, THYROIDAB in the last 72  hours. Anemia Panel: Recent Labs    08/06/18 0734  VITAMINB12 348  FOLATE 8.9  FERRITIN 259  TIBC 218*  IRON 91  RETICCTPCT 1.4   Sepsis Labs: Recent Labs  Lab 08/04/18 0037  LATICACIDVEN 1.7    Recent Results (from the past 240 hour(s))  SARS Coronavirus 2 (Hosp order,Performed in [facility] lab via [employer] ID)     Status: None   Collection Time: 08/04/18 12:33 AM   Specimen: Dry Nasal Swab ([employer] ID Now)  Result Value Ref Range Status   SARS Coronavirus 2 (Abbott ID Now) NEGATIVE NEGATIVE Final    Comment: (NOTE) SARS-CoV-2 target nucleic acids are NOT DETECTED. The SARS-CoV-2 RNA is generally detectable in upper and lower respiratory specimens during the acute phase of infection.  Negativeresults do not preclude SARS-CoV-2 infection, do not rule out coinfections with other pathogens, and should not be used as the  sole basis for treatment or other patient management decisions.  Negative results must be combined with clinical observations, patient history, and epidemiological information. The expected result is Negative. Fact Sheet for Patients: GolfingFamily.no Fact Sheet for Healthcare Providers: https://www.hernandez-brewer.com/ This test is not yet approved or cleared by the [place] FDA and  has been authorized for detection and/or diagnosis of SARS-CoV-2 by FDA under an Emergency Use Authorization (EUA).  This EUA will remain in effect (meaning this test can be used) for the duration of  the COVID19 declaration under Section 5 64(b)(1) of the Act, 21 U.S.C.  section [phone] 3(b)(1), unless the authorization is terminated or revoked sooner. Performed at [facility], [address] [place], [place] [address]   Culture, blood (Routine X 2) w Reflex to ID Panel     Status: Abnormal   Collection Time: 08/04/18 12:43 AM   Specimen: BLOOD  Result Value Ref Range Status   Specimen Description   Final    BLOOD  RIGHT ANTECUBITAL Performed at [facility], [address] [place], [place] [address]  Special Requests   Final    BOTTLES DRAWN AEROBIC AND ANAEROBIC Blood Culture adequate volume Performed at [facility], [address] [place], [place] [address]    Culture  Setup Time   Final    GRAM NEGATIVE RODS IN BOTH AEROBIC AND ANAEROBIC BOTTLES CRITICAL RESULT CALLED TO, READ BACK BY AND VERIFIED WITH: M.BELL,PHARMD 2337 08/04/2018 M.CAMPBELL Performed at [facility], [place] [address], [place], [place] [address]    Culture ESCHERICHIA COLI (A)  Final   Report Status 08/06/2018 FINAL  Final   Organism ID, Bacteria ESCHERICHIA COLI  Final      Susceptibility   Escherichia coli - MIC*    AMPICILLIN 8 SENSITIVE Sensitive     CEFAZOLIN <=4 SENSITIVE Sensitive     CEFEPIME <=1 SENSITIVE Sensitive     CEFTAZIDIME <=1 SENSITIVE Sensitive     CEFTRIAXONE <=1 SENSITIVE Sensitive     CIPROFLOXACIN <=0.25 SENSITIVE Sensitive     GENTAMICIN <=1 SENSITIVE Sensitive     IMIPENEM <=0.25 SENSITIVE Sensitive     TRIMETH/SULFA <=20 SENSITIVE Sensitive     AMPICILLIN/SULBACTAM 4 SENSITIVE Sensitive     PIP/TAZO <=4 SENSITIVE Sensitive     Extended ESBL NEGATIVE Sensitive     * ESCHERICHIA COLI  Blood Culture ID Panel (Reflexed)     Status: Abnormal   Collection Time: 08/04/18 12:43 AM  Result Value Ref Range Status   Enterococcus species NOT DETECTED NOT DETECTED Final   Listeria monocytogenes NOT DETECTED NOT DETECTED Final   Staphylococcus species NOT DETECTED NOT DETECTED Final   Staphylococcus aureus (BCID) NOT DETECTED NOT DETECTED Final   Streptococcus species NOT DETECTED NOT DETECTED Final   Streptococcus agalactiae NOT DETECTED NOT DETECTED Final   Streptococcus pneumoniae NOT DETECTED NOT DETECTED Final   Streptococcus pyogenes NOT DETECTED NOT DETECTED Final   Acinetobacter baumannii NOT DETECTED NOT DETECTED Final   Enterobacteriaceae species DETECTED (A)  NOT DETECTED Final    Comment: Enterobacteriaceae represent a large family of gram-negative bacteria, not a single organism. CRITICAL RESULT CALLED TO, READ BACK BY AND VERIFIED WITH: M.BELL,PHARMD 2337 08/04/2018 M.CAMPBELL    Enterobacter cloacae complex NOT DETECTED NOT DETECTED Final   Escherichia coli DETECTED (A) NOT DETECTED Final    Comment: CRITICAL RESULT CALLED TO, READ BACK BY AND VERIFIED WITH: M.BELL,PHARMD 2337 08/04/2018 M.CAMPBELL    Klebsiella oxytoca NOT DETECTED NOT DETECTED Final   Klebsiella pneumoniae NOT DETECTED NOT DETECTED Final   Proteus species NOT DETECTED NOT DETECTED Final   Serratia marcescens NOT DETECTED NOT DETECTED Final   Carbapenem resistance NOT DETECTED NOT DETECTED Final   Haemophilus influenzae NOT DETECTED NOT DETECTED Final   Neisseria meningitidis NOT DETECTED NOT DETECTED Final   Pseudomonas aeruginosa NOT DETECTED NOT DETECTED Final   Candida albicans NOT DETECTED NOT DETECTED Final   Candida glabrata NOT DETECTED NOT DETECTED Final   Candida krusei NOT DETECTED NOT DETECTED Final   Candida parapsilosis NOT DETECTED NOT DETECTED Final   Candida tropicalis NOT DETECTED NOT DETECTED Final    Comment: Performed at [facility], [place] [address] [place], [place] [address]  Culture, blood (Routine X 2) w Reflex to ID Panel     Status: Abnormal   Collection Time: 08/04/18 12:50 AM   Specimen: BLOOD  Result Value Ref Range Status   Specimen Description   Final    BLOOD LEFT ANTECUBITAL Performed at [facility], [address] [place], [place]  [address]    Special Requests   Final    BOTTLES DRAWN AEROBIC AND ANAEROBIC Blood Culture adequate volume Performed at [facility], [address] [place], [place] [address]    Culture  Setup Time   Final    GRAM NEGATIVE RODS IN BOTH AEROBIC AND ANAEROBIC BOTTLES CRITICAL VALUE NOTED.  VALUE IS CONSISTENT WITH PREVIOUSLY REPORTED AND CALLED VALUE. Performed at [facility], [place] [address], [place], [place] [address]    Culture ESCHERICHIA COLI (A)  Final   Report Status 08/06/2018 FINAL  Final  SARS Coronavirus 2 (CEPHEID - Performed in [facility] hospital lab), Hosp Order     Status: None   Collection Time: 08/04/18  6:25 AM   Specimen: Nasopharyngeal Swab  Result Value Ref Range Status   SARS Coronavirus 2 NEGATIVE NEGATIVE Final    Comment: (NOTE) If result is NEGATIVE SARS-CoV-2 target nucleic acids are NOT DETECTED. The SARS-CoV-2 RNA is generally detectable in upper and lower  respiratory specimens during the acute phase of infection. The lowest  concentration of SARS-CoV-2 viral copies this assay can detect is 250  copies / mL. A negative result does not preclude SARS-CoV-2 infection  and should not be used as the sole basis for treatment or other  patient management decisions.  A negative result may occur with  improper specimen collection / handling, submission of specimen other  than nasopharyngeal swab, presence of viral mutation(s) within the  areas targeted by this assay, and inadequate number of viral copies  (<250 copies / mL). A negative result must be combined with clinical  observations, patient history, and epidemiological information. If result is POSITIVE SARS-CoV-2 target nucleic acids are DETECTED. The SARS-CoV-2 RNA is generally detectable in upper and lower  respiratory specimens dur ing the acute phase of infection.  Positive  results are indicative of active infection with SARS-CoV-2.  Clinical  correlation with patient history and other diagnostic information is  necessary to determine patient infection status.  Positive results do  not rule out bacterial infection or co-infection with other viruses. If result is PRESUMPTIVE POSTIVE SARS-CoV-2 nucleic acids MAY BE PRESENT.   A presumptive positive result was obtained on the submitted specimen  and confirmed on repeat testing.  While 2019 novel coronavirus   (SARS-CoV-2) nucleic acids may be present in the submitted sample  additional confirmatory testing may be necessary for epidemiological  and / or clinical management purposes  to differentiate between  SARS-CoV-2 and other Sarbecovirus currently known to infect humans.  If clinically indicated additional testing with an alternate test  methodology [phone]) is advised. The SARS-CoV-2 RNA is generally  detectable in upper and lower respiratory sp ecimens during the acute  phase of infection. The expected result is Negative. Fact Sheet for Patients:  StrictlyIdeas.no Fact Sheet for Healthcare Providers: BankingDealers.co.za This test is not yet approved or cleared by the [place] FDA and has been authorized for detection and/or diagnosis of SARS-CoV-2 by FDA under an Emergency Use Authorization (EUA).  This EUA will remain in effect (meaning this test can be used) for the duration of the COVID-19 declaration under Section 564(b)(1) of the Act, 21 U.S.C. section 360bbb-3(b)(1), unless the authorization is terminated or revoked sooner. Performed at [facility], [place] [address], [place],  [address]   Surgical PCR screen     Status: None   Collection Time: 08/06/18  7:42 AM   Specimen: Nasal Mucosa; Nasal Swab  Result  Value Ref Range Status   MRSA, PCR NEGATIVE NEGATIVE Final   Staphylococcus aureus NEGATIVE NEGATIVE Final    Comment: (NOTE) The Xpert SA Assay (FDA approved for NASAL specimens in patients 22 years of age and older), is one component of a comprehensive surveillance program. It is not intended to diagnose infection nor to guide or monitor treatment. Performed at [facility], [place] [address], [place], [place] [address]     Radiology Studies: No results found. Scheduled Meds: . [MAR Hold] diltiazem  240 mg Oral Daily  . [MAR Hold] mupirocin ointment  1 application Nasal  BID  . [MAR Hold] pantoprazole (PROTONIX) IV  40 mg Intravenous Q12H   Continuous Infusions: . sodium chloride Stopped (08/06/18 1125)  . [MAR Hold] piperacillin-tazobactam (ZOSYN)  IV 3.375 g (08/06/18 0558)    LOS: 2 days   Kerney Elbe, DO [facility] PAGER is on [place]  If 7PM-7AM, please contact night-coverage [email or web address] Password [facility] 08/06/2018, 11:51 AM

## 2018-08-06 NOTE — Progress Notes (Signed)
Received patient from surgery. Alert, Oriented, on 2L Derek Curry. Lap sites to abd CDI. VSS. Patient c/o pain in abdomen, given morphine iv. Will monitor closely.

## 2018-08-06 NOTE — H&P (View-Only) (Signed)
Patient ID: Derek Curry, male   DOB: [birth year], 78 y.o.   MRN: [id number]    Progress Note   Subjective  Patient is status post laparoscopic cholecystectomy today,  IOC per radiologist read as probable filling defects in the distal CBD and lack of contrast entering the duodenum likely reflecting choledocholithiasis  On Zosyn LFTs improved today, T bili 1.5/alk phos 136/ALT 64  BC + Ecoli/ Enterobacter  Eliquis has been on hold  He is sore  Post op , no severe pain     Objective   Vital signs in last 24 hours: Temp:  [97.5 F (36.4 C)-99.3 F (37.4 C)] 97.7 F (36.5 C) (06/22 1557) Pulse Rate:  [58-60] 59 (06/22 1557) Resp:  [12-20] 16 (06/22 1530) BP: (112-124)/(60-82) 124/70 (06/22 1557) SpO2:  [96 %-100 %] 100 % (06/22 1557) Last BM Date: 08/05/18 General: elderly    AA male  in NAD Heart:  Regular rate and rhythm; no murmurs Lungs: Respirations even and unlabored, lungs CTA bilaterally Abdomen:  Soft, tender  Upper abdomen,and nondistended.  Decreased BS. Extremities:  Without edema. Neurologic:  Alert and oriented,  grossly normal neurologically. Psych:  Cooperative. Normal mood and affect.  Intake/Output from previous day: 06/21 0701 - 06/22 0700 In: 3148.6 [P.O.:600; I.V.:2345.5; IV Piggyback:203.1] Out: 1855 [Urine:1855] Intake/Output this shift: Total I/O In: 1469.5 [I.V.:1419.5; IV Piggyback:50] Out: 450 [Urine:400; Blood:50]  Lab Results: Recent Labs    08/04/18 0033 08/04/18 0102 08/05/18 0353 08/06/18 0734  WBC 8.4  --  10.1 7.4  HGB 12.7* 13.3 10.8* 11.0*  HCT 38.9* 39.0 33.1* 33.9*  PLT 178  --  138* 146*   BMET Recent Labs    08/04/18 0033 08/04/18 0102 08/05/18 0353 08/06/18 0734  NA 137 138 139 138  K 4.0 4.8 3.5 3.9  CL 102  --  108 107  CO2 23  --  22 21*  GLUCOSE 137*  --  116* 92  BUN 23  --  19 13  CREATININE 1.65*  --  1.30* 1.17  CALCIUM 9.6  --  8.4* 9.0   LFT Recent Labs    08/06/18 0734  PROT 6.4*   ALBUMIN 2.6*  AST 31  ALT 64*  ALKPHOS 136*  BILITOT 1.5*   PT/INR Recent Labs    08/04/18 0818  LABPROT 14.8  INR 1.2    Studies/Results: Dg Cholangiogram Operative  Result Date: 08/06/2018 CLINICAL DATA:  Cholecystectomy for cholelithiasis. Abnormal liver function tests. EXAM: INTRAOPERATIVE CHOLANGIOGRAM TECHNIQUE: Cholangiographic images from the C-arm fluoroscopic device were submitted for interpretation post-operatively. Please see the procedural report for the amount of contrast and the fluoroscopy time utilized. COMPARISON:  Ultrasound and CT studies on 08/04/2018 FINDINGS: Intraoperative imaging demonstrates sluggish flow of contrast through the distal CBD with suggestion of probable filling defects in the distal CBD. No contrast is seen to enter the duodenum. No contrast extravasation. IMPRESSION: Intraoperative cholangiogram demonstrates probable filling defects in the distal CBD and lack of contrast entering the duodenum. Findings likely reflect choledocholithiasis. Electronically Signed   By: Aletta Edouard M.D.   On: 08/06/2018 14:29       Assessment / Plan:     #66 78 year old white male presenting with epigastric pain and elevated LFTs -found to have E. coli/Enterobacter bacteremia likely secondary to early cholangitis/cholecystitis.  Patient is status post cholecystectomy today. IOC is positive-probable filling defects in the distal CBD consistent with choledocholithiasis.  LFTs have been improving, WBC normal.  Patient is on Zosyn.  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 Lyndel Safe in a.m. tomorrow 08/07/2018.  Procedure has   been discussed in detail with the patient including indications risks and benefits and he is agreeable to proceed.  We specifically discussed  5% risk of pancreatitis  Continue IV Zosyn  Clear liquids this evening then n.p.o. after midnight  Labs in a.m.  Patient had order for IV heparin to start at 6 AM tomorrow.  I discontinued this order -patient having ERCP with stone extraction tomorrow.  Heparin versus restarting Eliquis will need to be sorted out per pharmacy/hospitalist post ERCP.     Principal Problem:   Elevated liver enzymes Active Problems:   Paroxysmal A-fib (HCC)   Acute abdominal pain   AKI (acute kidney injury) ([place])   Gallstones     LOS: 2 days   [name]  08/06/2018, 3:58 PM     Attending physician's note   I have taken an interval history, reviewed the chart and examined the patient. I agree with the Advanced Practitioner's note, impression and recommendations.   IOC positive for CBD stones. LFTs improving.  On Zosyn.  Eliquis on hold. BC were positive for E. Coli/Enterobacter likely d/t acute cholecystitis.  Doubt ascending cholangitis.  Plan: -Continue antibiotics. -ERCP in AM.  I have explained the procedure, risks and benefits including small but definite risks of pancreatitis, perforation, bleeding and risks of anesthesia.  Benefits were also explained.  He wishes to proceed.  Carmell Austria, MD [facility] [phone].

## 2018-08-06 NOTE — Progress Notes (Signed)
PHARMACY NOTE -  [facility] has been assisting with dosing of Zosyn for IAI.  Dosage remains stable at 3.375 g IV q8 hr and need for further dosage adjustment appears unlikely at present given improved renal function  Pharmacy will sign off, following peripherally for culture results or dose adjustments. Please reconsult if a change in clinical status warrants re-evaluation of dosage.  Reuel Boom, PharmD, BCPS [phone] 08/06/2018, 2:14 PM  ]

## 2018-08-06 NOTE — Op Note (Signed)
08/04/2018 - 08/06/2018  2:28 PM  PATIENT:  Derek Curry  78 y.o. male  PRE-OPERATIVE DIAGNOSIS:  cholelithiasis  POST-OPERATIVE DIAGNOSIS:  cholelithiasis  PROCEDURE:  Procedure(s): LAPAROSCOPIC CHOLECYSTECTOMY WITH INTRAOPERATIVE CHOLANGIOGRAM (N/A)  SURGEON:  Surgeon(s) and Role:    * Jovita Kussmaul, MD - Primary  PHYSICIAN ASSISTANT:   ASSISTANTS: Saverio Danker, PA   ANESTHESIA:   local and general  EBL:  50 mL   BLOOD ADMINISTERED:none  DRAINS: none   LOCAL MEDICATIONS USED:  MARCAINE     SPECIMEN:  Source of Specimen:  gallbladder  DISPOSITION OF SPECIMEN:  PATHOLOGY  COUNTS:  YES  TOURNIQUET:  * No tourniquets in log *  DICTATION: .Dragon Dictation     Procedure: After informed consent was obtained the patient was brought to the operating room and placed in the supine position on the operating room table. After adequate induction of general anesthesia the patient's abdomen was prepped with ChloraPrep allowed to dry and draped in usual sterile manner. An appropriate timeout was performed. The area below the umbilicus was infiltrated with quarter percent  Marcaine. A small incision was made with a 15 blade knife. The incision was carried down through the subcutaneous tissue bluntly with a hemostat and [employer] retractors. The linea alba was identified. The linea alba was incised with a 15 blade knife and each side was grasped with Coker clamps. The preperitoneal space was then probed with a hemostat until the peritoneum was opened and access was gained to the abdominal cavity. A 0 Vicryl pursestring stitch was placed in the fascia surrounding the opening. A Hassan cannula was then placed through the opening and anchored in place with the previously placed Vicryl purse string stitch. The abdomen was insufflated with carbon dioxide without difficulty. A laparoscope was inserted through the [facility] cannula in the right upper quadrant was inspected. Next the epigastric  region was infiltrated with % Marcaine. A small incision was made with a 15 blade knife. A 5 mm port was placed bluntly through this incision into the abdominal cavity under direct vision. Next 2 sites were chosen laterally on the right side of the abdomen for placement of 5 mm ports. Each of these areas was infiltrated with quarter percent Marcaine. Small stab incisions were made with a 15 blade knife. 5 mm ports were then placed bluntly through these incisions into the abdominal cavity under direct vision without difficulty. A blunt grasper was placed through the lateralmost 5 mm port and used to grasp the dome of the gallbladder and elevated anteriorly and superiorly. Another blunt grasper was placed through the other 5 mm port and used to retract the body and neck of the gallbladder. The gallbladder was very intrahepatic and required a 5th 25mm port to be placed in the LUQ for retraction. A dissector was placed through the epigastric port and using the electrocautery the peritoneal reflection at the gallbladder neck was opened. Blunt dissection was then carried out in this area until the gallbladder neck-cystic duct junction was readily identified and a good window was created. A single clip was placed on the gallbladder neck. A small  ductotomy was made just below the clip with laparoscopic scissors. A 14-gauge Angiocath was then placed through the anterior abdominal wall under direct vision. A Reddick cholangiogram catheter was then placed through the Angiocath and flushed. The catheter was then placed in the cystic duct and anchored in place with a clip. A cholangiogram was obtained that showed no filling defects good emptying into  the duodenum an adequate length on the cystic duct. The anchoring clip and catheters were then removed from the patient. 3 clips were placed proximally on the cystic duct and the duct was divided between the 2 sets of clips. Posterior to this the cystic artery was identified and  again dissected bluntly in a circumferential manner until a good window  was created. 2 clips were placed proximally and one distally on the artery and the artery was divided between the 2 sets of clips. Next a laparoscopic hook cautery device was used to separate the gallbladder from the liver bed. Prior to completely detaching the gallbladder from the liver bed the liver bed was inspected and several small bleeding points were coagulated with the electrocautery until the area was completely hemostatic. The bed also required placement of a piece of surgicel snow for hemostasis.  The gallbladder was then detached the rest of it from the liver bed without difficulty. A laparoscopic bag was inserted through the hassan port. The laparoscope was moved to the epigastric port. The gallbladder was placed within the bag and the bag was sealed.  The bag with the gallbladder was then removed with the [facility] cannula through the infraumbilical port without difficulty. The fascial defect was then closed with the previously placed Vicryl pursestring stitch as well as with another figure-of-eight 0 Vicryl stitch. The liver bed was inspected again and found to be hemostatic. The abdomen was irrigated with copious amounts of saline until the effluent was clear. The ports were then removed under direct vision without difficulty and were found to be hemostatic. The gas was allowed to escape. The skin incisions were all closed with interrupted 4-0 Monocryl subcuticular stitches. Dermabond dressings were applied. The patient tolerated the procedure well. At the end of the case all needle sponge and instrument counts were correct. The patient was then awakened and taken to recovery in stable condition  PLAN OF CARE: Admit to inpatient   PATIENT DISPOSITION:  PACU - hemodynamically stable.   Delay start of Pharmacological VTE agent (>24hrs) due to surgical blood loss or risk of bleeding: yes

## 2018-08-06 NOTE — Anesthesia Preprocedure Evaluation (Signed)
Anesthesia Evaluation  Patient identified by MRN, date of birth, ID band Patient awake    Reviewed: Allergy & Precautions, H&P , NPO status , Patient's Chart, lab work & pertinent test results, reviewed documented beta blocker date and time   Airway Mallampati: II  TM Distance: >3 FB Neck ROM: full    Dental no notable dental hx.    Pulmonary neg pulmonary ROS,    Pulmonary exam normal breath sounds clear to auscultation       Cardiovascular Exercise Tolerance: Good hypertension, Pt. on medications + dysrhythmias Atrial Fibrillation + pacemaker  Rhythm:regular Rate:Normal     Neuro/Psych negative neurological ROS  negative psych ROS   GI/Hepatic negative GI ROS, Neg liver ROS,   Endo/Other  negative endocrine ROSdiabetes  Renal/GU negative Renal ROS  negative genitourinary   Musculoskeletal   Abdominal   Peds  Hematology negative hematology ROS (+)   Anesthesia Other Findings   Reproductive/Obstetrics negative OB ROS                            Anesthesia Physical Anesthesia Plan  ASA: III and emergent  Anesthesia Plan: General   Post-op Pain Management:    Induction:   PONV Risk Score and Plan: 2 and Ondansetron  Airway Management Planned: Oral ETT  Additional Equipment:   Intra-op Plan:   Post-operative Plan:   Informed Consent: I have reviewed the patients History and Physical, chart, labs and discussed the procedure including the risks, benefits and alternatives for the proposed anesthesia with the patient or authorized representative who has indicated his/her understanding and acceptance.     Dental Advisory Given  Plan Discussed with: CRNA, Anesthesiologist and Surgeon  Anesthesia Plan Comments:        Anesthesia Quick Evaluation

## 2018-08-06 NOTE — Progress Notes (Addendum)
[place] for heparin Indication: atrial fibrillation  Allergies  Allergen Reactions  . Benazepril Anaphylaxis  . Shellfish Allergy Swelling  . Vicodin [Hydrocodone-Acetaminophen] Other (See Comments)    Dizzy    Patient Measurements: Height: 5\' 8"  (172.7 cm) Weight: 173 lb (78.5 kg) IBW/kg (Calculated) : 68.4 Heparin Dosing Weight = TBW = 78 kg  Vital Signs: Temp: 98.4 F (36.9 C) (06/22 0554) Temp Source: Oral (06/22 0554) BP: 118/82 (06/22 0554) Pulse Rate: 59 (06/22 0554)  Labs: Recent Labs    08/04/18 0033 08/04/18 0102  08/04/18 0818  08/05/18 0353 08/05/18 1150 08/05/18 2300 08/06/18 0734  HGB 12.7* 13.3  --   --   --  10.8*  --   --  PENDING  HCT 38.9* 39.0  --   --   --  33.1*  --   --  PENDING  PLT 178  --   --   --   --  138*  --   --  146*  APTT  --   --   --  35   < > 75* 52* 59*  --   LABPROT  --   --   --  14.8  --   --   --   --   --   INR  --   --   --  1.2  --   --   --   --   --   HEPARINUNFRC  --   --    < > 1.02*  --  0.50 0.30 0.24* 0.28*  CREATININE 1.65*  --   --   --   --  1.30*  --   --  1.17   < > = values in this interval not displayed.    Estimated Creatinine Clearance: 51.2 mL/min (by C-G formula based on SCr of 1.17 mg/dL).  Assessment: Patient admitted with abdominal pain - surgery consulted. Planning for possible surgical intervention on Monday. Pt was taking apixaban 5 mg PO BID PTA for atrial fibrillation. Anticoagulation being converted to heparin drip in anticipation of surgery.  Last dose of apixaban: 6/19 @ 2200  Today, 08/06/2018:  AM heparin level slightly below goal but now trending back up, indicating DOAC effects worn off (also correlating with aPTT)  Heparin currently off for lap choley  CBC stable from yesterday  Goal of Therapy:  Heparin level 0.3-0.7 units/ml APTT 66-102 seconds Monitor platelets by anticoagulation protocol: Yes   Plan:   F/u anticoag plans after  OR - may either resume Eliquis or UFH  If resuming heparin, would start at 1350 units/hr  CBC and HL daily while on heparin  Reuel Boom, PharmD, BCPS [phone] 08/06/2018, 9:08 AM   ADDENDUM  Spoke with Dr. Marlou Starks following procedure - he was concerned with degree of bleeding during case and would like to resume heparin at 0600 tomorrow at low rate of 900 ml/hr. Given low heparin level this AM on 1250 units/hr, patient would definitely be subtherapeutic and Dr. Marlou Starks was aware of this. He was fine with pharmacy adjusting per our usual nomogram with the first heparin level (without bolus), but wanted to ensure no bleeding on a lower rate of heparin first  Plan:  Resume heparin tomorrow AM at 0600 at 900 units/hr  Check heparin level in 8 hrs - may adjust as usual as long as CBC ok and no active bleeding; NO BOLUS  CBC daily  Monitor for signs of bleeding  Dian Situ  [place], PharmD, [place] 08/06/2018, 3:35 PM

## 2018-08-06 NOTE — Anesthesia Procedure Notes (Signed)
Procedure Name: Intubation Date/Time: 08/06/2018 1:08 PM Performed by: Gwyndolyn Saxon, CRNA Pre-anesthesia Checklist: Patient identified, Emergency Drugs available, Suction available and Patient being monitored Patient Re-evaluated:Patient Re-evaluated prior to induction Oxygen Delivery Method: Circle system utilized Preoxygenation: Pre-oxygenation with 100% oxygen Induction Type: IV induction Ventilation: Mask ventilation without difficulty Laryngoscope Size: Miller and 2 Grade View: Grade I Tube type: Oral Tube size: 7.0 mm Number of attempts: 1 Airway Equipment and Method: Patient positioned with wedge pillow and Stylet Placement Confirmation: ETT inserted through vocal cords under direct vision,  positive ETCO2 and breath sounds checked- equal and bilateral Secured at: 22 cm Tube secured with: Tape Dental Injury: Teeth and Oropharynx as per pre-operative assessment

## 2018-08-06 NOTE — Transfer of Care (Signed)
Immediate Anesthesia Transfer of Care Note  Patient: Derek Curry  Procedure(s) Performed: LAPAROSCOPIC CHOLECYSTECTOMY WITH INTRAOPERATIVE CHOLANGIOGRAM (N/A Abdomen)  Patient Location: PACU  Anesthesia Type:General  Level of Consciousness: awake, alert  and patient cooperative  Airway & Oxygen Therapy: Patient Spontanous Breathing and Patient connected to face mask oxygen  Post-op Assessment: Report given to RN and Post -op Vital signs reviewed and stable  Post vital signs: Reviewed and stable  Last Vitals:  Vitals Value Taken Time  BP 121/66 08/06/18 1443  Temp    Pulse 61 08/06/18 1444  Resp    SpO2 98 % 08/06/18 1444  Vitals shown include unvalidated device data.  Last Pain:  Vitals:   08/06/18 0810  TempSrc:   PainSc: 0-No pain      Patients Stated Pain Goal: 3 (30/07/62 2633)  Complications: No apparent anesthesia complications

## 2018-08-07 ENCOUNTER — Inpatient Hospital Stay (HOSPITAL_COMMUNITY): Payer: HMO

## 2018-08-07 ENCOUNTER — Encounter (HOSPITAL_COMMUNITY): Payer: Self-pay | Admitting: General Surgery

## 2018-08-07 ENCOUNTER — Inpatient Hospital Stay (HOSPITAL_COMMUNITY): Payer: HMO | Admitting: Anesthesiology

## 2018-08-07 ENCOUNTER — Encounter (HOSPITAL_COMMUNITY)
Admission: EM | Disposition: A | Discharge status: Home / Self Care | Payer: Self-pay | Source: Home / Self Care | Attending: Internal Medicine

## 2018-08-07 DIAGNOSIS — K8051 Calculus of bile duct without cholangitis or cholecystitis with obstruction: Secondary | ICD-10-CM

## 2018-08-07 DIAGNOSIS — K8042 Calculus of bile duct with acute cholecystitis without obstruction: Secondary | ICD-10-CM

## 2018-08-07 DIAGNOSIS — K8070 Calculus of gallbladder and bile duct without cholecystitis without obstruction: Secondary | ICD-10-CM

## 2018-08-07 HISTORY — PX: REMOVAL OF STONES: SHX5545

## 2018-08-07 HISTORY — PX: SPHINCTEROTOMY: SHX5544

## 2018-08-07 HISTORY — PX: ERCP: SHX5425

## 2018-08-07 LAB — COMPREHENSIVE METABOLIC PANEL
ALT: 65 U/L — ABNORMAL HIGH (ref 0–44)
AST: 56 U/L — ABNORMAL HIGH (ref 15–41)
Albumin: 2.6 g/dL — ABNORMAL LOW (ref 3.5–5.0)
Alkaline Phosphatase: 129 U/L — ABNORMAL HIGH (ref 38–126)
Anion gap: 11 (ref 5–15)
BUN: 11 mg/dL (ref 8–23)
CO2: 20 mmol/L — ABNORMAL LOW (ref 22–32)
Calcium: 9.1 mg/dL (ref 8.9–10.3)
Chloride: 105 mmol/L (ref 98–111)
Creatinine, Ser: 1.11 mg/dL (ref 0.61–1.24)
GFR calc Af Amer: >60 mL/min (ref >60)
GFR calc non Af Amer: >60 mL/min (ref >60)
Glucose, Bld: 99 mg/dL (ref 70–99)
Potassium: 4.3 mmol/L (ref 3.5–5.1)
Sodium: 136 mmol/L (ref 135–145)
Total Bilirubin: 1.3 mg/dL — ABNORMAL HIGH (ref 0.3–1.2)
Total Protein: 6.6 g/dL (ref 6.5–8.1)

## 2018-08-07 LAB — CBC WITH DIFFERENTIAL/PLATELET
Abs Immature Granulocytes: 0.09 10*3/uL — ABNORMAL HIGH (ref 0.00–0.07)
Basophils Absolute: 0 10*3/uL (ref 0.0–0.1)
Basophils Relative: 0 %
Eosinophils Absolute: 0 10*3/uL (ref 0.0–0.5)
Eosinophils Relative: 0 %
HCT: 36.4 % — ABNORMAL LOW (ref 39.0–52.0)
Hemoglobin: 11.6 g/dL — ABNORMAL LOW (ref 13.0–17.0)
Immature Granulocytes: 1 %
Lymphocytes Relative: 8 %
Lymphs Abs: 0.8 10*3/uL (ref 0.7–4.0)
MCH: 33.5 pg (ref 26.0–34.0)
MCHC: 31.9 g/dL (ref 30.0–36.0)
MCV: 105.2 fL — ABNORMAL HIGH (ref 80.0–100.0)
Monocytes Absolute: 0.4 10*3/uL (ref 0.1–1.0)
Monocytes Relative: 4 %
Neutro Abs: 8.8 10*3/uL — ABNORMAL HIGH (ref 1.7–7.7)
Neutrophils Relative %: 87 %
Platelets: 158 10*3/uL (ref 150–400)
RBC: 3.46 MIL/uL — ABNORMAL LOW (ref 4.22–5.81)
RDW: 13.8 % (ref 11.5–15.5)
WBC: 10.1 10*3/uL (ref 4.0–10.5)
nRBC: 0 % (ref 0.0–0.2)

## 2018-08-07 LAB — PHOSPHORUS: Phosphorus: 3.6 mg/dL (ref 2.5–4.6)

## 2018-08-07 LAB — MAGNESIUM: Magnesium: 1.7 mg/dL (ref 1.7–2.4)

## 2018-08-07 LAB — LIPASE, BLOOD: Lipase: 17 U/L (ref 11–51)

## 2018-08-07 SURGERY — ERCP, WITH INTERVENTION IF INDICATED
Anesthesia: General

## 2018-08-07 MED ORDER — ONDANSETRON HCL 4 MG/2ML IJ SOLN
INTRAMUSCULAR | Status: DC | PRN
  Administered 2018-08-07: 4 mg via INTRAVENOUS

## 2018-08-07 MED ORDER — DEXAMETHASONE SODIUM PHOSPHATE 10 MG/ML IJ SOLN
INTRAMUSCULAR | Status: DC | PRN
  Administered 2018-08-07: 10 mg via INTRAVENOUS

## 2018-08-07 MED ORDER — LIDOCAINE 2% (20 MG/ML) 5 ML SYRINGE
INTRAMUSCULAR | Status: DC | PRN
  Administered 2018-08-07: 60 mg via INTRAVENOUS

## 2018-08-07 MED ORDER — MIDAZOLAM HCL 2 MG/2ML IJ SOLN
INTRAMUSCULAR | Status: DC | PRN
  Administered 2018-08-07: 2 mg via INTRAVENOUS

## 2018-08-07 MED ORDER — HEPARIN (PORCINE) 25000 UT/250ML-% IV SOLN
1350.0000 [IU]/h | INTRAVENOUS | Status: DC
  Administered 2018-08-07: 1350 [IU]/h via INTRAVENOUS

## 2018-08-07 MED ORDER — FENTANYL CITRATE (PF) 250 MCG/5ML IJ SOLN
INTRAMUSCULAR | Status: DC | PRN
  Administered 2018-08-07: 100 ug via INTRAVENOUS

## 2018-08-07 MED ORDER — PROPOFOL 10 MG/ML IV BOLUS
INTRAVENOUS | Status: DC | PRN
  Administered 2018-08-07: 110 mg via INTRAVENOUS

## 2018-08-07 MED ORDER — SODIUM CHLORIDE 0.9 % IV SOLN
3.0000 g | Freq: Four times a day (QID) | INTRAVENOUS | Status: DC
  Administered 2018-08-07 – 2018-08-08 (×5): 3 g via INTRAVENOUS
  Filled 2018-08-07 (×5): qty 3

## 2018-08-07 MED ORDER — LACTATED RINGERS IV SOLN
INTRAVENOUS | Status: DC | PRN
  Administered 2018-08-07: 08:00:00 via INTRAVENOUS

## 2018-08-07 MED ORDER — SUCCINYLCHOLINE CHLORIDE 200 MG/10ML IV SOSY
PREFILLED_SYRINGE | INTRAVENOUS | Status: DC | PRN
  Administered 2018-08-07: 100 mg via INTRAVENOUS

## 2018-08-07 MED ORDER — INDOMETHACIN 50 MG RE SUPP
RECTAL | Status: AC
  Filled 2018-08-07: qty 2

## 2018-08-07 MED ORDER — LACTATED RINGERS IV SOLN: INTRAVENOUS | Status: DC

## 2018-08-07 MED ORDER — INDOMETHACIN 50 MG RE SUPP
100.0000 mg | Freq: Once | RECTAL | Status: AC
  Administered 2018-08-07: 100 mg via RECTAL
  Filled 2018-08-07: qty 2

## 2018-08-07 MED ORDER — SODIUM CHLORIDE 0.9 % IV SOLN
INTRAVENOUS | Status: DC | PRN
  Administered 2018-08-07: 25 mL

## 2018-08-07 MED ORDER — FENTANYL CITRATE (PF) 100 MCG/2ML IJ SOLN: 25.0000 ug | INTRAMUSCULAR | Status: DC | PRN

## 2018-08-07 MED ORDER — FENTANYL CITRATE (PF) 100 MCG/2ML IJ SOLN
INTRAMUSCULAR | Status: AC
  Filled 2018-08-07: qty 2

## 2018-08-07 MED ORDER — GLUCAGON HCL RDNA (DIAGNOSTIC) 1 MG IJ SOLR
INTRAMUSCULAR | Status: DC | PRN
  Administered 2018-08-07 (×2): .2 mg via INTRAVENOUS

## 2018-08-07 MED ORDER — INDOMETHACIN 50 MG RE SUPP
RECTAL | Status: DC | PRN
  Administered 2018-08-07: 100 mg via RECTAL

## 2018-08-07 MED ORDER — ROCURONIUM BROMIDE 10 MG/ML (PF) SYRINGE
PREFILLED_SYRINGE | INTRAVENOUS | Status: DC | PRN
  Administered 2018-08-07: 40 mg via INTRAVENOUS
  Administered 2018-08-07: 10 mg via INTRAVENOUS

## 2018-08-07 MED ORDER — GLUCAGON HCL RDNA (DIAGNOSTIC) 1 MG IJ SOLR
INTRAMUSCULAR | Status: AC
  Filled 2018-08-07: qty 1

## 2018-08-07 MED ORDER — SUGAMMADEX SODIUM 200 MG/2ML IV SOLN
INTRAVENOUS | Status: DC | PRN
  Administered 2018-08-07: 200 mg via INTRAVENOUS

## 2018-08-07 MED ORDER — PROPOFOL 10 MG/ML IV BOLUS
INTRAVENOUS | Status: AC
  Filled 2018-08-07: qty 20

## 2018-08-07 MED ORDER — PROMETHAZINE HCL 25 MG/ML IJ SOLN: 6.2500 mg | INTRAMUSCULAR | Status: DC | PRN

## 2018-08-07 MED ORDER — PHENYLEPHRINE 40 MCG/ML (10ML) SYRINGE FOR IV PUSH (FOR BLOOD PRESSURE SUPPORT)
PREFILLED_SYRINGE | INTRAVENOUS | Status: DC | PRN
  Administered 2018-08-07 (×2): 80 ug via INTRAVENOUS

## 2018-08-07 MED ORDER — MIDAZOLAM HCL 2 MG/2ML IJ SOLN
INTRAMUSCULAR | Status: AC
  Filled 2018-08-07: qty 2

## 2018-08-07 NOTE — Progress Notes (Signed)
Patient ID: Derek Curry, male   DOB: September 07, 1940, 78 y.o.   MRN: [id number]    Day of Surgery  Subjective: Patient feels well.  No abdominal pain.  Tolerated ERCP well.  Just ate some liquids with no nausea or issues.    Objective: Vital signs in last 24 hours: Temp:  [97 F (36.1 C)-99.1 F (37.3 C)] 98.3 F (36.8 C) (06/23 1332) Pulse Rate:  [58-73] 60 (06/23 1332) Resp:  [12-21] 18 (06/23 1332) BP: (115-139)/(64-86) 139/86 (06/23 1332) SpO2:  [96 %-100 %] 99 % (06/23 1332) Last BM Date: 08/06/18  Intake/Output from previous day: 06/22 0701 - 06/23 0700 In: 2916 [P.O.:240; I.V.:2626; IV Piggyback:50] Out: 2250 [Urine:2200; Blood:50] Intake/Output this shift: Total I/O In: 1140 [P.O.:240; I.V.:900] Out: 300 [Urine:300]  PE: Abd: soft, really not tender, incisions c/d/i, +BS, ND  Lab Results:  Recent Labs    08/06/18 0734 08/07/18 0537  WBC 7.4 10.1  HGB 11.0* 11.6*  HCT 33.9* 36.4*  PLT 146* 158   BMET Recent Labs    08/06/18 0734 08/07/18 0537  NA 138 136  K 3.9 4.3  CL 107 105  CO2 21* 20*  GLUCOSE 92 99  BUN 13 11  CREATININE 1.17 1.11  CALCIUM 9.0 9.1   PT/INR No results for input(s): LABPROT, INR in the last 72 hours. CMP     Component Value Date/Time   NA 136 08/07/2018 0537   K 4.3 08/07/2018 0537   CL 105 08/07/2018 0537   CO2 20 (L) 08/07/2018 0537   GLUCOSE 99 08/07/2018 0537   BUN 11 08/07/2018 0537   CREATININE 1.11 08/07/2018 0537   CALCIUM 9.1 08/07/2018 0537   PROT 6.6 08/07/2018 0537   ALBUMIN 2.6 (L) 08/07/2018 0537   AST 56 (H) 08/07/2018 0537   ALT 65 (H) 08/07/2018 0537   ALKPHOS 129 (H) 08/07/2018 0537   BILITOT 1.3 (H) 08/07/2018 0537   GFRNONAA >60 08/07/2018 0537   GFRAA >60 08/07/2018 0537   Lipase     Component Value Date/Time   LIPASE 17 08/07/2018 0537       Studies/Results: Dg Cholangiogram Operative  Result Date: 08/06/2018 CLINICAL DATA:  Cholecystectomy for cholelithiasis. Abnormal liver  function tests. EXAM: INTRAOPERATIVE CHOLANGIOGRAM TECHNIQUE: Cholangiographic images from the C-arm fluoroscopic device were submitted for interpretation post-operatively. Please see the procedural report for the amount of contrast and the fluoroscopy time utilized. COMPARISON:  Ultrasound and CT studies on 08/04/2018 FINDINGS: Intraoperative imaging demonstrates sluggish flow of contrast through the distal CBD with suggestion of probable filling defects in the distal CBD. No contrast is seen to enter the duodenum. No contrast extravasation. IMPRESSION: Intraoperative cholangiogram demonstrates probable filling defects in the distal CBD and lack of contrast entering the duodenum. Findings likely reflect choledocholithiasis. Electronically Signed   By: Aletta Edouard M.D.   On: 08/06/2018 14:29   Dg Ercp Biliary & Pancreatic Ducts  Result Date: 08/07/2018 CLINICAL DATA:  78 year old male with a history of choledocholithiasis EXAM: INTRAOPERATIVE CHOLANGIOGRAM TECHNIQUE: Cholangiographic images from the C-arm fluoroscopic device were submitted for interpretation post-operatively. Please see the procedural report for the amount of contrast and the fluoroscopy time utilized. COMPARISON:  08/06/2018, CT 08/04/2018 FINDINGS: Limited intraoperative fluoroscopic spot images during ERCP. Initial image demonstrates endoscope projecting over the upper abdomen. Surgical changes of cholecystectomy. There is then passage of safety wire into the extrahepatic biliary ducts and partial opacification with retrograde contrast. Multiple rounded filling defects within the extrahepatic biliary system. Incomplete opacification of the ductal  system. IMPRESSION: Limited images during ERCP demonstrates partial opacification of the extrahepatic biliary system with small rounded filling defects, potentially air bubbles or stones/debris. Please refer to the dictated operative report for full details of intraoperative findings and  procedure. Electronically Signed   By: Corrie Mckusick D.O.   On: 08/07/2018 10:25    Anti-infectives: Anti-infectives (From admission, onward)   Start     Dose/Rate Route Frequency Ordered Stop   08/07/18 1200  Ampicillin-Sulbactam (UNASYN) 3 g in sodium chloride 0.9 % 100 mL IVPB     3 g 200 mL/hr over 30 Minutes Intravenous Every 6 hours 08/07/18 1150     08/06/18 0745  cefTRIAXone (ROCEPHIN) 2 g in sodium chloride 0.9 % 100 mL IVPB  Status:  Discontinued     2 g 200 mL/hr over 30 Minutes Intravenous On call to O.R. 08/06/18 0740 08/06/18 0740   08/04/18 1400  piperacillin-tazobactam (ZOSYN) IVPB 3.375 g  Status:  Discontinued     3.375 g 12.5 mL/hr over 240 Minutes Intravenous Every 8 hours 08/04/18 0746 08/07/18 1150   08/04/18 0445  piperacillin-tazobactam (ZOSYN) IVPB 3.375 g     3.375 g 100 mL/hr over 30 Minutes Intravenous  Once 08/04/18 0430 08/04/18 7445       Assessment/Plan Choledocholithiasis, POD 1, s/p lap chole with IOC, POD 0, s/p ERCP -doing well today. -adv diet -surgically stable -may resume eliquis from our standpoint whenever, but GI recommends 3 days from now. -mobilize and pulm toilet  FEN - carb mod diet VTE - on hold for procedures ID - unasyn, surgically doesn't need any further abx as he did not have cholecystitis.   LOS: 3 days    Henreitta Cea , [facility] 08/07/2018, 2:13 PM Pager: [phone]

## 2018-08-07 NOTE — Transfer of Care (Signed)
Immediate Anesthesia Transfer of Care Note  Patient: Derek Curry  Procedure(s) Performed: ENDOSCOPIC RETROGRADE CHOLANGIOPANCREATOGRAPHY (ERCP) (N/A )  Patient Location: Endoscopy Unit  Anesthesia Type:General  Level of Consciousness: drowsy  Airway & Oxygen Therapy: Patient Spontanous Breathing and Patient connected to face mask oxygen  Post-op Assessment: Report given to RN and Post -op Vital signs reviewed and stable  Post vital signs: Reviewed and stable  Last Vitals:  Vitals Value Taken Time  BP    Temp    Pulse 73 08/07/18 0946  Resp 14 08/07/18 0946  SpO2 100 % 08/07/18 0946  Vitals shown include unvalidated device data.  Last Pain:  Vitals:   08/07/18 0814  TempSrc: Temporal  PainSc:       Patients Stated Pain Goal: 3 (70/01/74 9449)  Complications: No apparent anesthesia complications

## 2018-08-07 NOTE — Care Management Important Message (Signed)
Important Message  Patient Details IM Letter given to Rhea Pink SW to present to the Patient Name: [name] MRN: [id number] Date of Birth: 18-Aug-1940   Medicare Important Message Given:  Yes     Kerin Salen 08/07/2018, 11:07 AM

## 2018-08-07 NOTE — Progress Notes (Signed)
[facility] for heparin Indication: atrial fibrillation  Allergies  Allergen Reactions  . Benazepril Anaphylaxis  . Shellfish Allergy Swelling  . Vicodin [Hydrocodone-Acetaminophen] Other (See Comments)    Dizzy    Patient Measurements: Height: 5\' 8"  (172.7 cm) Weight: 173 lb (78.5 kg) IBW/kg (Calculated) : 68.4 Heparin Dosing Weight = TBW = 78 kg  Vital Signs: Temp: 98.6 F (37 C) (06/23 1101) Temp Source: Oral (06/23 1101) BP: 117/73 (06/23 1101) Pulse Rate: 59 (06/23 1101)  Labs: Recent Labs    08/05/18 0353 08/05/18 1150 08/05/18 2300 08/06/18 0734 08/07/18 0537  HGB 10.8*  --   --  11.0* 11.6*  HCT 33.1*  --   --  33.9* 36.4*  PLT 138*  --   --  146* 158  APTT 75* 52* 59*  --   --   HEPARINUNFRC 0.50 0.30 0.24* 0.28*  --   CREATININE 1.30*  --   --  1.17 1.11    Estimated Creatinine Clearance: 53.9 mL/min (by C-G formula based on SCr of 1.11 mg/dL).  Assessment: Patient admitted with abdominal pain - surgery consulted. Planning for possible surgical intervention on Monday. Pt was taking apixaban 5 mg PO BID PTA for atrial fibrillation. Anticoagulation being converted to heparin drip in anticipation of surgery.  Last dose of apixaban: 6/19 @ 2200  Significant events: 6/22: Heparin off for lap chole in AM - d/t bleeding from procedure wanted heparin started following morning 6/23: heparin not started in AM, continue to hold for ERCP  Today, 08/07/2018:  Heparin currently off for ERCP, most recent level slightly subtherapeutic on 1250 units/hr  CBC stable from yesterday  GI ok to resume heparin 6 hrs post-procedure  Goal of Therapy:  Heparin level 0.3-0.7 units/ml Monitor platelets by anticoagulation protocol: Yes   Plan:   Resume heparin at 4p today at 1350 units/hr, no bolus  Check 8-hr heparin level  CBC daily while on heparin  Monitor for signs of bleeding  Reuel Boom, PharmD,  BCPS [phone] 08/07/2018, 12:27 PM

## 2018-08-07 NOTE — Interval H&P Note (Signed)
History and Physical Interval Note:  08/07/2018 8:26 AM  Derek Curry  has presented today for surgery, with the diagnosis of CBD stone.  The various methods of treatment have been discussed with the patient and family. After consideration of risks, benefits and other options for treatment, the patient has consented to  Procedure(s): ENDOSCOPIC RETROGRADE CHOLANGIOPANCREATOGRAPHY (ERCP) (N/A) as a surgical intervention.  The patient's history has been reviewed, patient examined, no change in status, stable for surgery.  I have reviewed the patient's chart and labs.  Questions were answered to the patient's satisfaction.     [name]

## 2018-08-07 NOTE — Anesthesia Procedure Notes (Signed)
Procedure Name: Intubation Date/Time: 08/07/2018 8:39 AM Performed by: Sharlette Dense, CRNA Patient Re-evaluated:Patient Re-evaluated prior to induction Oxygen Delivery Method: Circle system utilized Preoxygenation: Pre-oxygenation with 100% oxygen Induction Type: IV induction and Rapid sequence Laryngoscope Size: Miller and 3 Grade View: Grade I Tube type: Oral Tube size: 7.5 mm Number of attempts: 1 Airway Equipment and Method: Stylet Placement Confirmation: ETT inserted through vocal cords under direct vision,  positive ETCO2 and breath sounds checked- equal and bilateral Secured at: 21 cm Tube secured with: Tape Dental Injury: Teeth and Oropharynx as per pre-operative assessment

## 2018-08-07 NOTE — Anesthesia Preprocedure Evaluation (Signed)
Anesthesia Evaluation  Patient identified by MRN, date of birth, ID band Patient awake    Reviewed: Allergy & Precautions, NPO status , Patient's Chart, lab work & pertinent test results  Airway Mallampati: II  TM Distance: >3 FB Neck ROM: Full    Dental no notable dental hx.    Pulmonary neg pulmonary ROS,    Pulmonary exam normal breath sounds clear to auscultation       Cardiovascular hypertension, Normal cardiovascular exam+ pacemaker  Rhythm:Regular Rate:Normal     Neuro/Psych negative neurological ROS  negative psych ROS   GI/Hepatic negative GI ROS, Neg liver ROS,   Endo/Other  diabetes  Renal/GU negative Renal ROS  negative genitourinary   Musculoskeletal negative musculoskeletal ROS (+)   Abdominal   Peds negative pediatric ROS (+)  Hematology negative hematology ROS (+)   Anesthesia Other Findings   Reproductive/Obstetrics negative OB ROS                             Anesthesia Physical Anesthesia Plan  ASA: III  Anesthesia Plan: General   Post-op Pain Management:    Induction: Intravenous  PONV Risk Score and Plan: 2 and Dexamethasone and Ondansetron  Airway Management Planned: Oral ETT  Additional Equipment:   Intra-op Plan:   Post-operative Plan: Extubation in OR  Informed Consent: I have reviewed the patients History and Physical, chart, labs and discussed the procedure including the risks, benefits and alternatives for the proposed anesthesia with the patient or authorized representative who has indicated his/her understanding and acceptance.     Dental advisory given  Plan Discussed with: CRNA and Surgeon  Anesthesia Plan Comments:         Anesthesia Quick Evaluation

## 2018-08-07 NOTE — Progress Notes (Signed)
PROGRESS NOTE    Derek Curry  XVQ:[id number] DOB: 1940/11/10 DOA: 08/04/2018 PCP: Jefm Petty, MD  Brief Narrative:  HPI per Dr. Shelly Coss on 08/04/2018 Derek Curry is a 78 y.o. male  with hx of  DM, HTN,  Afib,Gout  who  presents with acute abdominal pain after a fatty meal at around 6 pm yesterday .  Patient has been having pain on his right side of the abdomen on and off for last 1 to 2 weeks.  Pain was unbearable and severe yesterday evening so he decided to come to [place].  Patient describes the pain as dull and rates as 8/10.  It became worse with movement.  Patient also reported of having subjective fevers at home with chills.  He denied any nausea, vomiting or diarrhea.  His bowel movements have been normal.  He also complained of shortness of breath when lying flat in the bed. Patient is physically active  and is actively working as a Curator.  He  has history of atrial fibrillation and is on Eliquis at home and follows with Dr. Minna Merritts at [facility]. Patient seen and examined the bedside.  Currently he is hemodynamically stable.  Denies any abdominal pain while on rest. Denies any chest pain, cough, dysuria.   ED Course: Found to be tachypneic on arrival.  Noted to have fever of 102 Fahrenheit when checked rectally.  Satting in the range of low 90s.  CT abdomen/pelvis showed gallstones with gallbladder wall thickening, possible gastroparesis.  Liver enzymes were elevated.  Clinical picture suggestive of acute cholecystitis.  Kept n.p.o.  Started on antibiotics.  General surgery consulted.  **Interim History  General Surgery and Gastroenterology evaluated and they are recommending a cholecystectomy with IOC.  LFTs are trending down abd likely has passed a gallstone.  Blood cultures grew out E. coli and likely has a bacteremia from a GI origin so continue IV Zosyn and narrow antibiotics based on sensitivities will be changed to IV Unasyn.  General surgery took  the patient for a cholecystectomy and IOC and IOC with positive for likely choledocholithiasis when ERCP was done today and showed choledocholithiasis status post biliary sphincterotomy and balloon extraction  Assessment & Plan:   Principal Problem:   Elevated liver enzymes Active Problems:   Paroxysmal A-fib (HCC)   Acute abdominal pain   AKI (acute kidney injury) ([place])   Gallstones  E Coli Bacteremia likely from GI Origin from below -Patient's Blood Cx showed Enterobacteriaceae Species and E Coli in 1/2 -True Bacteremia so will treat for 10-14 days -Started  On IV Zosyn for now and change to IV Unasyn; Was Pan Sensitive but will continue IV Zosyn for now and Narrow after Surgery  -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 -GI suspected that may he may have passed a common bile stone and that is why LFTs are trending down -GI recommending a lap chole with IOC and if the IOC is positive and they are recommending an ERCP -IOC had limited images but did demonstrate partial opacification of the extrahepatic biliary system with small rounded filling defects and potential air bubbles and stones with debris -This likely reflected choledocholithiasis so gastroenterology took the patient for an ERCP today we will choledocholithiasis which status post biliary sphincterotomy and balloon extraction -General surgery patient for cholecystectomy with IOC yesterday this was done by Dr. Marlou Starks -Continue with IV antibiotics and will likely need to change to p.o. at discharge for 7 to 10 days total  RUQ Pain and Symptomatic Cholelithiasis ? Obstructive Jaundice 2/2 Choledocholithiasis and Ascending Cholangitis, improving  -CT abdomen/pelvis showed "Gallstones with possible gallbladder wall thickening. Marked gastric distention with ingested material, raising concern for gastroparesis or delayed gastric emptying. Normal positioning of the ligament of Treitz, however proximal jejunum then courses abnormally into  the right abdomen. It is unclear whether this is secondary to displacement related to distended stomach versus partial small bowel malrotation. No associated bowel inflammation or obstruction." -RUQ U/S showed "Cholelithiasis.  No sonographic evidence of acute cholecystitis. Findings of adenomyomatosis of the gallbladder. No biliary ductal dilatation.  Normal liver."  -Presented with right-sided abdominal pain.  No nausea vomiting or diarrhea.  Also had fever, chills at home.  -Unable to Do MRCP due to Pacer -GI recommending that since his abdominal pain is resolving ALT is improved and that he may have passed a common bile duct stone recommending a lap chole with IOC if IOC is positive and they are open to recommend an ERCP -General surger did a laparoscopic cholecystectomy on IOC.  IOC did show some filling defect so patient was taken for ERCP today -ERCP did show choledocholithiasis and is status post biliary sphincterotomy and balloon extraction -Continue to hold Eliquis and per GI recommending to resume in 3 days but after I spoke with Dr. Lyndel Safe he states that maybe Eliquis can be restarted tomorrow evening -After discussion with Dr. Lyndel Safe he recommends continuing IV heparin 6 hours after ERCP and then continue to monitor for signs and symptoms of bleeding -Continue with pain management with IV Fentanyl 25-50 mcg q2hprn Moderate Pain and IV Morphine 2 mg every 4 PRN for severe pain and continue symptomatic treatment with antiemetics with 4 mg of IV Zofran every 6h as needed for nausea vomiting -Per GI recommending clear liquid diet and will need to go on a low-fat diet once able and able to tolerate  Acute Kidney Injury, improving  -Patient denies any history of CKD but suspect that he may have some.   -His Creatinine on 05/2013 was 2.9 but probably that was just a transient episode.  -BUN/Cr went from 23/1.65 -> 19/1.30 -> 13/1.17 -> 11/1.11 -Continue normal saline at a rate of 100 mL's per  hour -Avoid nephrotoxic medications if possible, contrast dyes, as well as hypotension -Continue monitor trend renal function and repeat CMP in a.m.  Abnormal LFT's/Elevated Liver Enzymes, improving  Hyperbilirubinemia, improving  -Secondary to Obstructive Jaundice and Suspected Ascending Cholangitis -AST went from 244 -> 81 -> 31 -> 56 and ALT went from 132 -> 102 -> 64 -> 65 -Denies heavy alcohol abuse, is a social drinker.   -Mildly elevated bilirubin at 2.8 but is worsened to 3.0 but is now improved to 1.3 -Unfortunately unable to do MRCP so GI is recommending general surgery do a cholecystectomy with IOC and if IOC is positive then they will do a ERCP;  -IOC is positive and patient ended up going for ERCP today and had a sphincterotomy and balloon extraction  Normal CBD as per ultrasound.  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  Paroxysmal A. Fib -Currently heart rate is controlled.  He follows with electrophysiologist Dr. Minna Merritts at [place] for anticoagulation at home.  Eliquis will be held for possible surgical intervention and now ERCP so will be resumed after GI and surgical clearance -Started on Heparin gtt and will continue with Pharmacy to Dose and per my discussion with GI will resume 6 hours after ERCP;  my discussion with Dr. Lyndel Safe he recommended possibly starting Eliquis back tomorrow evening -On Cardizem 240 mg p.o. daily for rate control  Hypertension -On Lisinopril-Hydrochlorothiazide at home.   -Currently blood pressure stable and was 117/73 -Antihypertensives on hold due to acute kidney injury.   -Continue IV Hydralazine 10 mg q6h PRN meds as needed.  Gout -On Allopurinol at home but currently holding   Diabetes mellitus -Not on medication at home.  On dietary modification. -Patient's blood sugar on daily CMP BMPs have been ranging from 92-116  Hyperlipidemia -On statin at home.   -Will hold because of elevated liver  enzymes. -Resume when LFTs are almost normal  GERD -C/w Pantoprazole 40 mg IV q12h  Gastric Distention and Retention noted on CT -Clear Liquid Diet and Eventual EGD  -Will need to r/o Gastroparesis and Partial Gastric Outlet Obstruction  Macrocytic Anemia -Patient's Hb/Hct went from 13.3/39.0 -> 10.8/33.1 -> 11.0/33.9 -> 11.6/36.4 -Check FOBT -Check Anemia Panel and showed iron level 91, TIBC of 127, TIBC of 218, saturation ratios of 42%, ferritin level of 259, folate level of 8.9, and vitamin B12 level of 340 -Continue to Monitor for S/Sx of Bleeding as patient is anticoagulated with Heparin gtt; Currently no Overt Bleeding Noted -Repeat CBC in AM   Thrombocytopenia, improved  -Patient's Platelet Count is now 158,000 -Continue to [place] for S/Sx of Bleeding now that patient is on going to be started back on Anticoagulation  -Repeat CBC in AM   Hypomagnesemia -Patient's magnesium level is 1.7 this morning -Need to monitor and replete as necessary -Repeat magnesium level in a.m.  DVT prophylaxis: Anticoagulated with Heparin gtt Code Status: FULL CODE  Family Communication: No family present at bedside  Disposition Plan: Remain inpatient for diet advancement  Consultants:   General Surgery  Gastroenterology   Procedures:  LAPAROSCOPIC CHOLECYSTECTOMY WITH INTRAOPERATIVE CHOLANGIOGRAM (N/A) Done by Dr. Marlou Starks on 08/06/2018  ERCP Findings:      A scout film of the abdomen was obtained. Surgical clips, consistent       with a previous cholecystectomy, were seen in the area of the right       upper quadrant of the abdomen. The esophagus was successfully intubated       under direct vision. The scope was advanced to a normal major papilla in       the descending duodenum without detailed examination of the pharynx,       larynx and associated structures, and upper GI tract. The upper GI tract       was grossly normal. The bile duct was deeply cannulated with the        short-nosed traction sphincterotome. Contrast was injected. I personally       interpreted the bile duct images. Image quality was excellent.      The lower third of the main bile duct and middle third of the main bile       duct contained 4 stones, the largest of which was 8 mm in diameter. The       lower third of the main bile duct was mildly dilated, with a stone       causing an obstruction. The largest diameter was 10 mm. The right and       left hepatic ducts were normal. Cystic duct remnant did fill. No leaks.       A 10 mm biliary sphincterotomy was made with a traction (standard)       sphincterotome using ERBE electrocautery endocut  at 12 o'clock position.       Adequacy of sphincterotomy was checked by bringing bowed sphincterotome       in and out of the sphincterotomy site. There was no post-sphincterotomy       bleeding. The biliary tree was swept with a 12 mm balloon starting at       the bifurcation. All stones were removed. Nothing was found.       Postocclusion cholangiogram was obtained. Endoscopic photodocumentation       was obtained.      The pancreatic duct was intentionally not cannulated to decrease risks       of pancreatitis. Impression:               - Choledocholithiasis s/p biliary sphincterotomy                            and balloon extraction.                           - Recent cholecystectomy.   Antimicrobials:  Anti-infectives (From admission, onward)   Start     Dose/Rate Route Frequency Ordered Stop   08/07/18 1200  Ampicillin-Sulbactam (UNASYN) 3 g in sodium chloride 0.9 % 100 mL IVPB     3 g 200 mL/hr over 30 Minutes Intravenous Every 6 hours 08/07/18 1150     08/06/18 0745  cefTRIAXone (ROCEPHIN) 2 g in sodium chloride 0.9 % 100 mL IVPB  Status:  Discontinued     2 g 200 mL/hr over 30 Minutes Intravenous On call to O.R. 08/06/18 0740 08/06/18 0740   08/04/18 1400  piperacillin-tazobactam (ZOSYN) IVPB 3.375 g  Status:  Discontinued     3.375  g 12.5 mL/hr over 240 Minutes Intravenous Every 8 hours 08/04/18 0746 08/07/18 1150   08/04/18 0445  piperacillin-tazobactam (ZOSYN) IVPB 3.375 g     3.375 g 100 mL/hr over 30 Minutes Intravenous  Once 08/04/18 0430 08/04/18 7026     Subjective: Seen and examined at bedside patient after he had his ERCP and had no complaints but states his abdomen was a sore and more distended.  No nausea or vomiting.  Denies chest pain, lightheadedness.  No other concerns requested this time and was feeling okay  Objective: Vitals:   08/07/18 0947 08/07/18 0957 08/07/18 1007 08/07/18 1101  BP: 133/65 129/71 128/76 117/73  Pulse: 73 69 61 (!) 59  Resp: 14 (!) 21 15 18   Temp: (!) 97.1 F (36.2 C)   98.6 F (37 C)  TempSrc: Other (Comment)   Oral  SpO2: 100% 100% 100% 97%  Weight:      Height:        Intake/Output Summary (Last 24 hours) at 08/07/2018 1211 Last data filed at 08/07/2018 3785 Gross per 24 hour  Intake 3496.42 ml  Output 1850 ml  Net 1646.42 ml   Filed Weights   08/04/18 0024  Weight: 78.5 kg   Examination: Physical Exam:  Constitutional: Well-nourished, well-developed overweight African-American male currently in no acute distress and is just come back from his ERCP and complaining of some abdominal soreness and distention Eyes: Lids and conjunctive are normal.  Sclerae nonicteric ENMT: External ears nose appear normal.  Grossly normal hearing Neck: Appears supple no JVD Respiratory: Diminished auscultation bilaterally no appreciable wheezing, rales, rhonchi.  Patient is not tachypneic or using any accessory muscles to breathe Cardiovascular: Regular rate and  rhythm.  No appreciable murmurs, rubs or gallops.  Has mild lower extremity edema Abdomen: Soft, mildly tender to palpate today.  More distended and is hypertympanic to percuss.  Bowel sounds are present GU: Deferred Musculoskeletal: No contractures or cyanosis.  No joint fomites in the upper lower extremities Skin:  Skin is warm dry no appreciable rashes or lesions on limited skin evaluation Neurologic: Cranial nerves II through XII grossly no visual focal deficits.  Romberg sign cerebellar reflexes were not assessed Psychiatric: Normal judgment and insight.  Patient is awake, alert, oriented x3.  Has a pleasant mood and  Data Reviewed: I have personally reviewed following labs and imaging studies  CBC: Recent Labs  Lab 08/04/18 0033 08/04/18 0102 08/05/18 0353 08/06/18 0734 08/07/18 0537  WBC 8.4  --  10.1 7.4 10.1  NEUTROABS 7.1  --   --  5.6 8.8*  HGB 12.7* 13.3 10.8* 11.0* 11.6*  HCT 38.9* 39.0 33.1* 33.9* 36.4*  MCV 102.9*  --  104.1* 102.7* 105.2*  PLT 178  --  138* 146* 376   Basic Metabolic Panel: Recent Labs  Lab 08/04/18 0033 08/04/18 0102 08/05/18 0353 08/06/18 0734 08/07/18 0537  NA 137 138 139 138 136  K 4.0 4.8 3.5 3.9 4.3  CL 102  --  108 107 105  CO2 23  --  22 21* 20*  GLUCOSE 137*  --  116* 92 99  BUN 23  --  19 13 11   CREATININE 1.65*  --  1.30* 1.17 1.11  CALCIUM 9.6  --  8.4* 9.0 9.1  MG  --   --   --  1.6* 1.7  PHOS  --   --   --  2.7 3.6   GFR: Estimated Creatinine Clearance: 53.9 mL/min (by C-G formula based on SCr of 1.11 mg/dL). Liver Function Tests: Recent Labs  Lab 08/04/18 0033 08/05/18 0353 08/06/18 0734 08/07/18 0537  AST 244* 81* 31 56*  ALT 132* 102* 64* 65*  ALKPHOS 249* 162* 136* 129*  BILITOT 2.8* 3.0* 1.5* 1.3*  PROT 8.3* 6.2* 6.4* 6.6  ALBUMIN 3.6 2.7* 2.6* 2.6*   Recent Labs  Lab 08/04/18 0033 08/07/18 0537  LIPASE 31 17   No results for input(s): AMMONIA in the last 168 hours. Coagulation Profile: Recent Labs  Lab 08/04/18 0818  INR 1.2   Cardiac Enzymes: No results for input(s): CKTOTAL, CKMB, CKMBINDEX, TROPONINI in the last 168 hours. BNP (last 3 results) No results for input(s): PROBNP in the last 8760 hours. HbA1C: No results for input(s): HGBA1C in the last 72 hours. CBG: Recent Labs  Lab 08/06/18 1456   GLUCAP 114*   Lipid Profile: No results for input(s): CHOL, HDL, LDLCALC, TRIG, CHOLHDL, LDLDIRECT in the last 72 hours. Thyroid Function Tests: No results for input(s): TSH, T4TOTAL, FREET4, T3FREE, THYROIDAB in the last 72 hours. Anemia Panel: Recent Labs    08/06/18 0734  VITAMINB12 348  FOLATE 8.9  FERRITIN 259  TIBC 218*  IRON 91  RETICCTPCT 1.4   Sepsis Labs: Recent Labs  Lab 08/04/18 0037  LATICACIDVEN 1.7    Recent Results (from the past 240 hour(s))  SARS Coronavirus 2 (Hosp order,Performed in [facility] lab via [employer] ID)     Status: None   Collection Time: 08/04/18 12:33 AM   Specimen: Dry Nasal Swab ([employer] ID Now)  Result Value Ref Range Status   SARS Coronavirus 2 ([employer] ID Now) NEGATIVE NEGATIVE Final    Comment: (NOTE) SARS-CoV-2 target nucleic acids  are NOT DETECTED. The SARS-CoV-2 RNA is generally detectable in upper and lower respiratory specimens during the acute phase of infection.  Negativeresults do not preclude SARS-CoV-2 infection, do not rule out coinfections with other pathogens, and should not be used as the  sole basis for treatment or other patient management decisions.  Negative results must be combined with clinical observations, patient history, and epidemiological information. The expected result is Negative. Fact Sheet for Patients: GolfingFamily.no Fact Sheet for Healthcare Providers: https://www.hernandez-brewer.com/ This test is not yet approved or cleared by the [place] FDA and  has been authorized for detection and/or diagnosis of SARS-CoV-2 by FDA under an Emergency Use Authorization (EUA).  This EUA will remain in effect (meaning this test can be used) for the duration of  the COVID19 declaration under Section 5 64(b)(1) of the Act, 21 U.S.C.  section 2295176530 3(b)(1), unless the authorization is terminated or revoked sooner. Performed at [facility], [address] [place], [place] [address]   Culture, blood (Routine X 2) w Reflex to ID Panel     Status: Abnormal   Collection Time: 08/04/18 12:43 AM   Specimen: BLOOD  Result Value Ref Range Status   Specimen Description   Final    BLOOD RIGHT ANTECUBITAL Performed at [facility], [address] [place], [place] [address]    Special Requests   Final    BOTTLES DRAWN AEROBIC AND ANAEROBIC Blood Culture adequate volume Performed at [facility], [address] [place], [place] [address]    Culture  Setup Time   Final    GRAM NEGATIVE RODS IN BOTH AEROBIC AND ANAEROBIC BOTTLES CRITICAL RESULT CALLED TO, READ BACK BY AND VERIFIED WITH: M.BELL,PHARMD 2337 08/04/2018 M.CAMPBELL Performed at [facility], [place] [address] [place], [place] [address]    Culture ESCHERICHIA COLI (A)  Final   Report Status 08/06/2018 FINAL  Final   Organism ID, Bacteria ESCHERICHIA COLI  Final      Susceptibility   Escherichia coli - MIC*    AMPICILLIN 8 SENSITIVE Sensitive     CEFAZOLIN <=4 SENSITIVE Sensitive     CEFEPIME <=1 SENSITIVE Sensitive     CEFTAZIDIME <=1 SENSITIVE Sensitive     CEFTRIAXONE <=1 SENSITIVE Sensitive     CIPROFLOXACIN <=0.25 SENSITIVE Sensitive     GENTAMICIN <=1 SENSITIVE Sensitive     IMIPENEM <=0.25 SENSITIVE Sensitive     TRIMETH/SULFA <=20 SENSITIVE Sensitive     AMPICILLIN/SULBACTAM 4 SENSITIVE Sensitive     PIP/TAZO <=4 SENSITIVE Sensitive     Extended ESBL NEGATIVE Sensitive     * ESCHERICHIA COLI  Blood Culture ID Panel (Reflexed)     Status: Abnormal   Collection Time: 08/04/18 12:43 AM  Result Value Ref Range Status   Enterococcus species NOT DETECTED NOT DETECTED Final   Listeria monocytogenes NOT DETECTED NOT DETECTED Final   Staphylococcus species NOT DETECTED NOT DETECTED Final   Staphylococcus aureus (BCID) NOT DETECTED NOT DETECTED Final   Streptococcus species NOT DETECTED NOT DETECTED Final   Streptococcus agalactiae NOT DETECTED NOT  DETECTED Final   Streptococcus pneumoniae NOT DETECTED NOT DETECTED Final   Streptococcus pyogenes NOT DETECTED NOT DETECTED Final   Acinetobacter baumannii NOT DETECTED NOT DETECTED Final   Enterobacteriaceae species DETECTED (A) NOT DETECTED Final    Comment: Enterobacteriaceae represent a large family of gram-negative bacteria, not a single organism. CRITICAL RESULT CALLED TO, READ BACK BY AND VERIFIED WITH:  M.BELL,PHARMD 2337 08/04/2018 M.CAMPBELL    Enterobacter cloacae complex NOT DETECTED NOT DETECTED Final   Escherichia coli DETECTED (A) NOT DETECTED Final    Comment: CRITICAL RESULT CALLED TO, READ BACK BY AND VERIFIED WITH: M.BELL,PHARMD 2337 08/04/2018 M.CAMPBELL    Klebsiella oxytoca NOT DETECTED NOT DETECTED Final   Klebsiella pneumoniae NOT DETECTED NOT DETECTED Final   Proteus species NOT DETECTED NOT DETECTED Final   Serratia marcescens NOT DETECTED NOT DETECTED Final   Carbapenem resistance NOT DETECTED NOT DETECTED Final   Haemophilus influenzae NOT DETECTED NOT DETECTED Final   Neisseria meningitidis NOT DETECTED NOT DETECTED Final   Pseudomonas aeruginosa NOT DETECTED NOT DETECTED Final   Candida albicans NOT DETECTED NOT DETECTED Final   Candida glabrata NOT DETECTED NOT DETECTED Final   Candida krusei NOT DETECTED NOT DETECTED Final   Candida parapsilosis NOT DETECTED NOT DETECTED Final   Candida tropicalis NOT DETECTED NOT DETECTED Final    Comment: Performed at [facility], [place] [address], [place], [place] [address]  Culture, blood (Routine X 2) w Reflex to ID Panel     Status: Abnormal   Collection Time: 08/04/18 12:50 AM   Specimen: BLOOD  Result Value Ref Range Status   Specimen Description   Final    BLOOD LEFT ANTECUBITAL Performed at [facility], [address] [place], [place] [address]    Special Requests   Final    BOTTLES DRAWN AEROBIC AND ANAEROBIC Blood Culture adequate volume Performed at [facility], [address] [place], [place] [address]    Culture  Setup Time   Final    GRAM NEGATIVE RODS IN BOTH AEROBIC AND ANAEROBIC BOTTLES CRITICAL VALUE NOTED.  VALUE IS CONSISTENT WITH PREVIOUSLY REPORTED AND CALLED VALUE. Performed at [facility], [place] [address], [place],  [address]    Culture ESCHERICHIA COLI (A)  Final   Report Status 08/06/2018 FINAL  Final  SARS Coronavirus 2 (CEPHEID - Performed in [facility] [facility] lab), Hosp Order     Status: None   Collection Time: 08/04/18  6:25 AM   Specimen: Nasopharyngeal Swab  Result Value Ref Range Status   SARS Coronavirus 2 NEGATIVE NEGATIVE Final    Comment: (NOTE) If result is NEGATIVE SARS-CoV-2 target nucleic acids are NOT DETECTED. The SARS-CoV-2 RNA is generally detectable in upper and lower  respiratory specimens during the acute phase of infection. The lowest  concentration of SARS-CoV-2 viral copies this assay can detect is 250  copies / mL. A negative result does not preclude SARS-CoV-2 infection  and should not be used as the sole basis for treatment or other  patient management decisions.  A negative result may occur with  improper specimen collection / handling, submission of specimen other  than nasopharyngeal swab, presence of viral mutation(s) within the  areas targeted by this assay, and inadequate number of viral copies  (<250 copies / mL). A negative result must be combined with clinical  observations, patient history, and epidemiological information. If result is POSITIVE SARS-CoV-2 target nucleic acids are DETECTED. The SARS-CoV-2 RNA is generally detectable in upper and lower  respiratory specimens dur ing the acute phase of infection.  Positive  results are indicative of active infection with SARS-CoV-2.  Clinical  correlation with patient history and other diagnostic information is  necessary to determine patient infection status.  Positive results do  not rule out bacterial infection or  co-infection with other viruses. If  result is PRESUMPTIVE POSTIVE SARS-CoV-2 nucleic acids MAY BE PRESENT.   A presumptive positive result was obtained on the submitted specimen  and confirmed on repeat testing.  While 2019 novel coronavirus  (SARS-CoV-2) nucleic acids may be present in the submitted sample  additional confirmatory testing may be necessary for epidemiological  and / or clinical management purposes  to differentiate between  SARS-CoV-2 and other Sarbecovirus currently known to infect humans.  If clinically indicated additional testing with an alternate test  methodology [phone]) is advised. The SARS-CoV-2 RNA is generally  detectable in upper and lower respiratory sp ecimens during the acute  phase of infection. The expected result is Negative. Fact Sheet for Patients:  StrictlyIdeas.no Fact Sheet for Healthcare Providers: BankingDealers.co.za This test is not yet approved or cleared by the [place] FDA and has been authorized for detection and/or diagnosis of SARS-CoV-2 by FDA under an Emergency Use Authorization (EUA).  This EUA will remain in effect (meaning this test can be used) for the duration of the COVID-19 declaration under Section 564(b)(1) of the Act, 21 U.S.C. section 360bbb-3(b)(1), unless the authorization is terminated or revoked sooner. Performed at [facility], [place] [address], [place], [place] [address]   Surgical PCR screen     Status: None   Collection Time: 08/06/18  7:42 AM   Specimen: Nasal Mucosa; Nasal Swab  Result Value Ref Range Status   MRSA, PCR NEGATIVE NEGATIVE Final   Staphylococcus aureus NEGATIVE NEGATIVE Final    Comment: (NOTE) The Xpert SA Assay (FDA approved for NASAL specimens in patients 34 years of age and older), is one component of a comprehensive surveillance program. It is not intended to diagnose infection nor to guide or monitor  treatment. Performed at [facility], [place] [address], [place], [place] [address]     Radiology Studies: Dg Cholangiogram Operative  Result Date: 08/06/2018 CLINICAL DATA:  Cholecystectomy for cholelithiasis. Abnormal liver function tests. EXAM: INTRAOPERATIVE CHOLANGIOGRAM TECHNIQUE: Cholangiographic images from the C-arm fluoroscopic device were submitted for interpretation post-operatively. Please see the procedural report for the amount of contrast and the fluoroscopy time utilized. COMPARISON:  Ultrasound and CT studies on 08/04/2018 FINDINGS: Intraoperative imaging demonstrates sluggish flow of contrast through the distal CBD with suggestion of probable filling defects in the distal CBD. No contrast is seen to enter the duodenum. No contrast extravasation. IMPRESSION: Intraoperative cholangiogram demonstrates probable filling defects in the distal CBD and lack of contrast entering the duodenum. Findings likely reflect choledocholithiasis. Electronically Signed   By: Aletta Edouard M.D.   On: 08/06/2018 14:29   Dg Ercp Biliary & Pancreatic Ducts  Result Date: 08/07/2018 CLINICAL DATA:  78 year old male with a history of choledocholithiasis EXAM: INTRAOPERATIVE CHOLANGIOGRAM TECHNIQUE: Cholangiographic images from the C-arm fluoroscopic device were submitted for interpretation post-operatively. Please see the procedural report for the amount of contrast and the fluoroscopy time utilized. COMPARISON:  08/06/2018, CT 08/04/2018 FINDINGS: Limited intraoperative fluoroscopic spot images during ERCP. Initial image demonstrates endoscope projecting over the upper abdomen. Surgical changes of cholecystectomy. There is then passage of safety wire into the extrahepatic biliary ducts and partial opacification with retrograde contrast. Multiple rounded filling defects within the extrahepatic biliary system. Incomplete opacification of the ductal system. IMPRESSION: Limited images during ERCP  demonstrates partial opacification of the extrahepatic biliary system with small rounded filling defects, potentially air bubbles or stones/debris. Please refer to the dictated operative report for full details of intraoperative findings and procedure. Electronically Signed   By: York Cerise  Earleen Newport D.O.   On: 08/07/2018 10:25   Scheduled Meds:  diltiazem  240 mg Oral Daily   mupirocin ointment  1 application Nasal BID   pantoprazole (PROTONIX) IV  40 mg Intravenous Q12H   Continuous Infusions:  sodium chloride 100 mL/hr at 08/07/18 1135   ampicillin-sulbactam (UNASYN) IV      LOS: 3 days   Kerney Elbe, DO [facility] PAGER is on [place]  If 7PM-7AM, please contact night-coverage [email or web address] Password [facility] 08/07/2018, 12:11 PM

## 2018-08-07 NOTE — Op Note (Signed)
[facility] Patient Name: Derek Curry Procedure Date: 08/07/2018 MRN: [id number] Attending MD: [name] , MD Date of Birth: [birth year] CSN: [id number] Age: 78 Admit Type: Inpatient Procedure:                ERCP Indications:              Bile duct stone(s). Abnormal IOC, s/p lap chole                            yesterday. Abnormal liver function tests. Providers:                [name], MD, Cleda Daub, RN, Marguerita Merles, Technician Referring MD:              Medicines:                General Anesthesia, IV Zosyn, glucagon 0.4 mg.                            Indocin suppositories 100 mg. Complications:            No immediate complications. Estimated Blood Loss:     Estimated blood loss: none. Procedure:                Pre-Anesthesia Assessment:                           - Prior to the procedure, a History and Physical                            was performed, and patient medications and                            allergies were reviewed. The patient's tolerance of                            previous anesthesia was also reviewed. The risks                            and benefits of the procedure and the sedation                            options and risks were discussed with the patient.                            All questions were answered, and informed consent                            was obtained. Prior Anticoagulants: The patient has                            taken Eliquis (apixaban), last dose was 4 days  prior to procedure. ASA Grade Assessment: III - A                            patient with severe systemic disease. After                            reviewing the risks and benefits, the patient was                            deemed in satisfactory condition to undergo the                            procedure.                           After obtaining informed consent, the scope was                       passed under direct vision. Throughout the                            procedure, the patient's blood pressure, pulse, and                            oxygen saturations were monitored continuously. The                            [id number]-Q180V ([id number]) Olympus duodenoscope was                            introduced through the mouth, and used to inject                            contrast into and used to inject contrast into the                            bile duct. The ERCP was accomplished without                            difficulty. The patient tolerated the procedure                            well. Scope In: Scope Out: Findings:      A scout film of the abdomen was obtained. Surgical clips, consistent       with a previous cholecystectomy, were seen in the area of the right       upper quadrant of the abdomen. The esophagus was successfully intubated       under direct vision. The scope was advanced to a normal major papilla in       the descending duodenum without detailed examination of the pharynx,       larynx and associated structures, and upper GI tract. The upper GI tract       was grossly normal. The bile duct was deeply cannulated with the       short-nosed traction sphincterotome. Contrast was  injected. I personally       interpreted the bile duct images. Image quality was excellent.      The lower third of the main bile duct and middle third of the main bile       duct contained 4 stones, the largest of which was 8 mm in diameter. The       lower third of the main bile duct was mildly dilated, with a stone       causing an obstruction. The largest diameter was 10 mm. The right and       left hepatic ducts were normal. Cystic duct remnant did fill. No leaks.       A 10 mm biliary sphincterotomy was made with a traction (standard)       sphincterotome using ERBE electrocautery endocut at 12 o'clock position.       Adequacy of sphincterotomy was checked by  bringing bowed sphincterotome       in and out of the sphincterotomy site. There was no post-sphincterotomy       bleeding. The biliary tree was swept with a 12 mm balloon starting at       the bifurcation. All stones were removed. Nothing was found.       Postocclusion cholangiogram was obtained. Endoscopic photodocumentation       was obtained.      The pancreatic duct was intentionally not cannulated to decrease risks       of pancreatitis. Impression:               - Choledocholithiasis s/p biliary sphincterotomy                            and balloon extraction.                           - Recent cholecystectomy. Moderate Sedation:      Not Applicable - Patient had care per Anesthesia. Recommendation:           - Watch for pancreatitis, bleeding, perforation,                            and cholangitis.                           - Clear liquid diet in 4 hours. Advance diet to                            low-fat diet as tolerated.                           - Can restart Eliquis in 3 days.                           - Follow-up in the GI clinic on as-needed basis. Procedure Code(s):        --- Professional ---                           [phone], Endoscopic retrograde                            cholangiopancreatography (ERCP);  with removal of                            calculi/debris from biliary/pancreatic duct(s)                           43262, Endoscopic retrograde                            cholangiopancreatography (ERCP); with                            sphincterotomy/papillotomy                           [phone], Endoscopic catheterization of the biliary                            ductal system, radiological supervision and                            interpretation Diagnosis Code(s):        --- Professional ---                           K80.51, Calculus of bile duct without cholangitis                            or cholecystitis with obstruction CPT copyright 2019 American Medical  Association. All rights reserved. The codes documented in this report are preliminary and upon coder review may  be revised to meet current compliance requirements. [name], MD 08/07/2018 9:41:12 AM This report has been signed electronically. Number of Addenda: 0

## 2018-08-07 NOTE — Anesthesia Postprocedure Evaluation (Signed)
Anesthesia Post Note  Patient: Derek Curry  Procedure(s) Performed: ENDOSCOPIC RETROGRADE CHOLANGIOPANCREATOGRAPHY (ERCP) (N/A ) SPHINCTEROTOMY REMOVAL OF STONES     Patient location during evaluation: PACU Anesthesia Type: General Level of consciousness: awake and alert Pain management: pain level controlled Vital Signs Assessment: post-procedure vital signs reviewed and stable Respiratory status: spontaneous breathing, nonlabored ventilation, respiratory function stable and patient connected to nasal cannula oxygen Cardiovascular status: blood pressure returned to baseline and stable Postop Assessment: no apparent nausea or vomiting Anesthetic complications: no    Last Vitals:  Vitals:   08/07/18 1101 08/07/18 1332  BP: 117/73 139/86  Pulse: (!) 59 60  Resp: 18 18  Temp: 37 C 36.8 C  SpO2: 97% 99%    Last Pain:  Vitals:   08/07/18 1332  TempSrc: Oral  PainSc:                  Aliesha Dolata S

## 2018-08-08 ENCOUNTER — Other Ambulatory Visit: Payer: Self-pay

## 2018-08-08 DIAGNOSIS — K8309 Other cholangitis: Secondary | ICD-10-CM

## 2018-08-08 DIAGNOSIS — K8043 Calculus of bile duct with acute cholecystitis with obstruction: Secondary | ICD-10-CM

## 2018-08-08 LAB — COMPREHENSIVE METABOLIC PANEL
ALT: 58 U/L — ABNORMAL HIGH (ref 0–44)
AST: 47 U/L — ABNORMAL HIGH (ref 15–41)
Albumin: 2.6 g/dL — ABNORMAL LOW (ref 3.5–5.0)
Alkaline Phosphatase: 117 U/L (ref 38–126)
Anion gap: 12 (ref 5–15)
BUN: 19 mg/dL (ref 8–23)
CO2: 17 mmol/L — ABNORMAL LOW (ref 22–32)
Calcium: 9.1 mg/dL (ref 8.9–10.3)
Chloride: 106 mmol/L (ref 98–111)
Creatinine, Ser: 1.09 mg/dL (ref 0.61–1.24)
GFR calc Af Amer: >60 mL/min (ref >60)
GFR calc non Af Amer: >60 mL/min (ref >60)
Glucose, Bld: 156 mg/dL — ABNORMAL HIGH (ref 70–99)
Potassium: 5.1 mmol/L (ref 3.5–5.1)
Sodium: 135 mmol/L (ref 135–145)
Total Bilirubin: 1.4 mg/dL — ABNORMAL HIGH (ref 0.3–1.2)
Total Protein: 6.9 g/dL (ref 6.5–8.1)

## 2018-08-08 LAB — CBC WITH DIFFERENTIAL/PLATELET
Abs Immature Granulocytes: 0.06 10*3/uL (ref 0.00–0.07)
Basophils Absolute: 0 10*3/uL (ref 0.0–0.1)
Basophils Relative: 0 %
Eosinophils Absolute: 0 10*3/uL (ref 0.0–0.5)
Eosinophils Relative: 0 %
HCT: 36.5 % — ABNORMAL LOW (ref 39.0–52.0)
Hemoglobin: 11.8 g/dL — ABNORMAL LOW (ref 13.0–17.0)
Immature Granulocytes: 1 %
Lymphocytes Relative: 6 %
Lymphs Abs: 0.7 10*3/uL (ref 0.7–4.0)
MCH: 33.5 pg (ref 26.0–34.0)
MCHC: 32.3 g/dL (ref 30.0–36.0)
MCV: 103.7 fL — ABNORMAL HIGH (ref 80.0–100.0)
Monocytes Absolute: 0.3 10*3/uL (ref 0.1–1.0)
Monocytes Relative: 2 %
Neutro Abs: 10.6 10*3/uL — ABNORMAL HIGH (ref 1.7–7.7)
Neutrophils Relative %: 91 %
Platelets: 175 10*3/uL (ref 150–400)
RBC: 3.52 MIL/uL — ABNORMAL LOW (ref 4.22–5.81)
RDW: 14 % (ref 11.5–15.5)
WBC: 11.6 10*3/uL — ABNORMAL HIGH (ref 4.0–10.5)
nRBC: 0 % (ref 0.0–0.2)

## 2018-08-08 LAB — GLUCOSE, CAPILLARY: Glucose-Capillary: 143 mg/dL — ABNORMAL HIGH (ref 70–99)

## 2018-08-08 LAB — MAGNESIUM: Magnesium: 1.8 mg/dL (ref 1.7–2.4)

## 2018-08-08 LAB — PHOSPHORUS: Phosphorus: 3.4 mg/dL (ref 2.5–4.6)

## 2018-08-08 LAB — HEPARIN LEVEL (UNFRACTIONATED)
Heparin Unfractionated: 0.16 IU/mL — ABNORMAL LOW (ref 0.30–0.70)
Heparin Unfractionated: 0.29 IU/mL — ABNORMAL LOW (ref 0.30–0.70)

## 2018-08-08 MED ORDER — APIXABAN 5 MG PO TABS
5.0000 mg | ORAL_TABLET | Freq: Two times a day (BID) | ORAL | Status: DC
  Administered 2018-08-08 – 2018-08-09 (×3): 5 mg via ORAL
  Filled 2018-08-08 (×3): qty 1

## 2018-08-08 MED ORDER — HEPARIN (PORCINE) 25000 UT/250ML-% IV SOLN
1500.0000 [IU]/h | INTRAVENOUS | Status: DC
  Administered 2018-08-08: 1500 [IU]/h via INTRAVENOUS
  Filled 2018-08-08: qty 250

## 2018-08-08 MED ORDER — APIXABAN 5 MG PO TABS: 5.0000 mg | ORAL_TABLET | Freq: Two times a day (BID) | ORAL | Status: DC

## 2018-08-08 MED ORDER — PANTOPRAZOLE SODIUM 40 MG PO TBEC
40.0000 mg | DELAYED_RELEASE_TABLET | Freq: Every day | ORAL | Status: DC
  Administered 2018-08-09: 40 mg via ORAL
  Filled 2018-08-08: qty 1

## 2018-08-08 MED ORDER — OXYCODONE HCL 5 MG PO TABS: 5.0000 mg | ORAL_TABLET | Freq: Four times a day (QID) | ORAL | 0 refills | Status: DC | PRN

## 2018-08-08 MED ORDER — HEPARIN (PORCINE) 25000 UT/250ML-% IV SOLN: 1500.0000 [IU]/h | INTRAVENOUS | Status: DC

## 2018-08-08 MED ORDER — AMOXICILLIN-POT CLAVULANATE 875-125 MG PO TABS
1.0000 | ORAL_TABLET | Freq: Two times a day (BID) | ORAL | Status: DC
  Administered 2018-08-08 – 2018-08-09 (×2): 1 via ORAL
  Filled 2018-08-08 (×2): qty 1

## 2018-08-08 NOTE — Progress Notes (Signed)
[facility] PROGRESS NOTE  Derek Curry DJS:[id number] DOB: 1940/12/24 DOA: 08/04/2018 PCP: Jefm Petty, MD  Assessment/Plan: 1. Acute cholecystitis and choledocholithiasis.  Status post laparoscopic cholecystectomy (6/22) and ERCP with stone extraction (6/23).  Stable from surgical and GI perspective.Outpatient surgical follow-up arranged. 2. E. coli bacteremia.  Possible translocation from early cholangitis.  Transition from IV Unasyn to Augmentin (completed 4 days of IV antibiotics)-for total 10-day course, monitor for no fever and clinical improvement for additional 24 hours and expect DC home with 6/25 3. Mild LFT elevation, improving. Related to CBD stone s/p extraction. Trend daily 4. Atrial fibrillation, rate controlled.  On heparin given recent procedures.  Resume Eliquis today closely monitor for any bleeding, continue dilitiazem 5. AKI, improving. Peak Cr 1.65, improving to baseline with fluids, oral diet, resolution of infection 6. HTN, stable held home lisinopril/hctz due to aki,  7. Gout, allopurinol held on admission. 8. T2DM. stable No agents at home. Cbg, SSI 9. HLD, held statin with elevated LFTs 10. GERD. D/c IV PPI, resume home oral ppi 11. Macrocytic anemia, hgb stable, no bleeding  Code Status: FULL  Family Communication: no family at bedside (indicate person spoken with, relationship, and if by phone, the number) Disposition Plan: likely home on 6/25 if stable on oral antibiotics   Consultants:  GI, surgery  Procedures:  ERCP sphincterotomy, lap chole  Antibiotics:  Zosyn, unasyn, augmentin (indicate start date, and stop date if known)  HPI/Subjective:  Derek Curry is a 78 y.o. year old male with medical history significant for DM, HTN, Afib,Gout  who presented on 08/04/2018 with acute abdominal pain after a fatty meal and was found to have acute cholecystitis, choledocholithiasis and E.coli bacteremia.  Tolerating oral diet this am No  abdominal pain No BM yet  Objective: Vitals:   08/08/18 1450 08/08/18 2100  BP: 126/71 117/72  Pulse: 60 (!) 59  Resp: 18 16  Temp: 98 F (36.7 C) 97.9 F (36.6 C)  SpO2: 98% 100%    Intake/Output Summary (Last 24 hours) at 08/08/2018 2213 Last data filed at 08/08/2018 1956 Gross per 24 hour  Intake 1608.51 ml  Output 1375 ml  Net 233.51 ml   Filed Weights   08/04/18 0024  Weight: 78.5 kg    Exam:   General:  Lying in bed, no distress  Cardiovascular: IRR, no edema  Respiratory: normal effort on room air  Abdomen: soft, non-tender, non-distended, normal bowel sounds, lap chole incisions healing well  Musculoskeletal: normal ROM   Skin dry, intact  Neurologic alert and oriented x4   Data Reviewed: Basic Metabolic Panel: Recent Labs  Lab 08/04/18 0033 08/04/18 0102 08/05/18 0353 08/06/18 0734 08/07/18 0537 08/08/18 0029  NA 137 138 139 138 136 135  K 4.0 4.8 3.5 3.9 4.3 5.1  CL 102  --  108 107 105 106  CO2 23  --  22 21* 20* 17*  GLUCOSE 137*  --  116* 92 99 156*  BUN 23  --  19 13 11 19   CREATININE 1.65*  --  1.30* 1.17 1.11 1.09  CALCIUM 9.6  --  8.4* 9.0 9.1 9.1  MG  --   --   --  1.6* 1.7 1.8  PHOS  --   --   --  2.7 3.6 3.4   Liver Function Tests: Recent Labs  Lab 08/04/18 0033 08/05/18 0353 08/06/18 0734 08/07/18 0537 08/08/18 0029  AST 244* 81* 31 56* 47*  ALT 132* 102* 64* 65* 58*  ALKPHOS  249* 162* 136* 129* 117  BILITOT 2.8* 3.0* 1.5* 1.3* 1.4*  PROT 8.3* 6.2* 6.4* 6.6 6.9  ALBUMIN 3.6 2.7* 2.6* 2.6* 2.6*   Recent Labs  Lab 08/04/18 0033 08/07/18 0537  LIPASE 31 17   No results for input(s): AMMONIA in the last 168 hours. CBC: Recent Labs  Lab 08/04/18 0033 08/04/18 0102 08/05/18 0353 08/06/18 0734 08/07/18 0537 08/08/18 0029  WBC 8.4  --  10.1 7.4 10.1 11.6*  NEUTROABS 7.1  --   --  5.6 8.8* 10.6*  HGB 12.7* 13.3 10.8* 11.0* 11.6* 11.8*  HCT 38.9* 39.0 33.1* 33.9* 36.4* 36.5*  MCV 102.9*  --  104.1* 102.7*  105.2* 103.7*  PLT 178  --  138* 146* 158 175   Cardiac Enzymes: No results for input(s): CKTOTAL, CKMB, CKMBINDEX, TROPONINI in the last 168 hours. BNP (last 3 results) No results for input(s): BNP in the last 8760 hours.  ProBNP (last 3 results) No results for input(s): PROBNP in the last 8760 hours.  CBG: Recent Labs  Lab 08/06/18 1456 08/08/18 0803  GLUCAP 114* 143*    Recent Results (from the past 240 hour(s))  SARS Coronavirus 2 (Hosp order,Performed in [facility] lab via [employer] ID)     Status: None   Collection Time: 08/04/18 12:33 AM   Specimen: Dry Nasal Swab ([employer] ID Now)  Result Value Ref Range Status   SARS Coronavirus 2 ([employer] Now) NEGATIVE NEGATIVE Final    Comment: (NOTE) SARS-CoV-2 target nucleic acids are NOT DETECTED. The SARS-CoV-2 RNA is generally detectable in upper and lower respiratory specimens during the acute phase of infection.  Negativeresults do not preclude SARS-CoV-2 infection, do not rule out coinfections with other pathogens, and should not be used as the  sole basis for treatment or other patient management decisions.  Negative results must be combined with clinical observations, patient history, and epidemiological information. The expected result is Negative. Fact Sheet for Patients: GolfingFamily.no Fact Sheet for Healthcare Providers: https://www.hernandez-brewer.com/ This test is not yet approved or cleared by the [place] FDA and  has been authorized for detection and/or diagnosis of SARS-CoV-2 by FDA under an Emergency Use Authorization (EUA).  This EUA will remain in effect (meaning this test can be used) for the duration of  the COVID19 declaration under Section 5 64(b)(1) of the Act, 21 U.S.C.  section [phone] 3(b)(1), unless the authorization is terminated or revoked sooner. Performed at [facility], [address] [place], [place] [address]   Culture, blood (Routine  X 2) w Reflex to ID Panel     Status: Abnormal   Collection Time: 08/04/18 12:43 AM   Specimen: BLOOD  Result Value Ref Range Status   Specimen Description   Final    BLOOD RIGHT ANTECUBITAL Performed at [facility], [address] [place], [place] [address]    Special Requests   Final    BOTTLES DRAWN AEROBIC AND ANAEROBIC Blood Culture adequate volume Performed at [facility], [address] [place], [place] [address]    Culture  Setup Time   Final    GRAM NEGATIVE RODS IN BOTH AEROBIC AND ANAEROBIC BOTTLES CRITICAL RESULT CALLED TO, READ BACK BY AND VERIFIED WITH: M.BELL,PHARMD 2337 08/04/2018 M.CAMPBELL Performed at [facility], [place] [address] [place], [place] [address]    Culture ESCHERICHIA COLI (A)  Final   Report Status 08/06/2018 FINAL  Final   Organism ID, Bacteria ESCHERICHIA COLI  Final      Susceptibility   Escherichia coli - MIC*    AMPICILLIN 8 SENSITIVE Sensitive     CEFAZOLIN <=4 SENSITIVE Sensitive     CEFEPIME <=1 SENSITIVE Sensitive     CEFTAZIDIME <=1 SENSITIVE Sensitive     CEFTRIAXONE <=1 SENSITIVE Sensitive     CIPROFLOXACIN <=0.25 SENSITIVE Sensitive     GENTAMICIN <=1 SENSITIVE Sensitive     IMIPENEM <=0.25 SENSITIVE Sensitive     TRIMETH/SULFA <=20 SENSITIVE Sensitive     AMPICILLIN/SULBACTAM 4 SENSITIVE Sensitive     PIP/TAZO <=4 SENSITIVE Sensitive     Extended ESBL NEGATIVE Sensitive     * ESCHERICHIA COLI  Blood Culture ID Panel (Reflexed)     Status: Abnormal   Collection Time: 08/04/18 12:43 AM  Result Value Ref Range Status   Enterococcus species NOT DETECTED NOT DETECTED Final   Listeria monocytogenes NOT DETECTED NOT DETECTED Final   Staphylococcus species NOT DETECTED NOT DETECTED Final   Staphylococcus aureus (BCID) NOT DETECTED NOT DETECTED Final   Streptococcus species NOT DETECTED NOT DETECTED Final   Streptococcus agalactiae NOT DETECTED NOT DETECTED Final   Streptococcus pneumoniae NOT DETECTED  NOT DETECTED Final   Streptococcus pyogenes NOT DETECTED NOT DETECTED Final   Acinetobacter baumannii NOT DETECTED NOT DETECTED Final   Enterobacteriaceae species DETECTED (A) NOT DETECTED Final    Comment: Enterobacteriaceae represent a large family of gram-negative bacteria, not a single organism. CRITICAL RESULT CALLED TO, READ BACK BY AND VERIFIED WITH: M.BELL,PHARMD 2337 08/04/2018 M.CAMPBELL    Enterobacter cloacae complex NOT DETECTED NOT DETECTED Final   Escherichia coli DETECTED (A) NOT DETECTED Final    Comment: CRITICAL RESULT CALLED TO, READ BACK BY AND VERIFIED WITH: M.BELL,PHARMD 2337 08/04/2018 M.CAMPBELL    Klebsiella oxytoca NOT DETECTED NOT DETECTED Final   Klebsiella pneumoniae NOT DETECTED NOT DETECTED Final   Proteus species NOT DETECTED NOT DETECTED Final   Serratia marcescens NOT DETECTED NOT DETECTED Final   Carbapenem resistance NOT DETECTED NOT DETECTED Final   Haemophilus influenzae NOT DETECTED NOT DETECTED Final   Neisseria meningitidis NOT DETECTED NOT DETECTED Final   Pseudomonas aeruginosa NOT DETECTED NOT DETECTED Final   Candida albicans NOT DETECTED NOT DETECTED Final   Candida glabrata NOT DETECTED NOT DETECTED Final   Candida krusei NOT DETECTED NOT DETECTED Final   Candida parapsilosis NOT DETECTED NOT DETECTED Final   Candida tropicalis NOT DETECTED NOT DETECTED Final    Comment: Performed at [facility], [place] [address] [place], [place] [address]  Culture, blood (Routine X 2) w Reflex to ID Panel     Status: Abnormal   Collection Time: 08/04/18 12:50 AM   Specimen: BLOOD  Result Value Ref Range Status   Specimen Description   Final    BLOOD LEFT ANTECUBITAL Performed at [facility], [address] [place], [place] [address]    Special Requests   Final    BOTTLES DRAWN AEROBIC AND ANAEROBIC Blood Culture adequate volume Performed at [facility], [address] [place], [place] [address]    Culture  Setup  Time   Final    GRAM NEGATIVE RODS IN BOTH AEROBIC AND ANAEROBIC BOTTLES CRITICAL VALUE NOTED.  VALUE IS CONSISTENT WITH PREVIOUSLY REPORTED AND CALLED VALUE. Performed at [facility], [place] [address] [place], [place] [address]    Culture ESCHERICHIA COLI (A)  Final   Report Status 08/06/2018 FINAL  Final  SARS Coronavirus 2 (  CEPHEID - Performed in [place] hospital lab), Hosp Order     Status: None   Collection Time: 08/04/18  6:25 AM   Specimen: Nasopharyngeal Swab  Result Value Ref Range Status   SARS Coronavirus 2 NEGATIVE NEGATIVE Final    Comment: (NOTE) If result is NEGATIVE SARS-CoV-2 target nucleic acids are NOT DETECTED. The SARS-CoV-2 RNA is generally detectable in upper and lower  respiratory specimens during the acute phase of infection. The lowest  concentration of SARS-CoV-2 viral copies this assay can detect is 250  copies / mL. A negative result does not preclude SARS-CoV-2 infection  and should not be used as the sole basis for treatment or other  patient management decisions.  A negative result may occur with  improper specimen collection / handling, submission of specimen other  than nasopharyngeal swab, presence of viral mutation(s) within the  areas targeted by this assay, and inadequate number of viral copies  (<250 copies / mL). A negative result must be combined with clinical  observations, patient history, and epidemiological information. If result is POSITIVE SARS-CoV-2 target nucleic acids are DETECTED. The SARS-CoV-2 RNA is generally detectable in upper and lower  respiratory specimens dur ing the acute phase of infection.  Positive  results are indicative of active infection with SARS-CoV-2.  Clinical  correlation with patient history and other diagnostic information is  necessary to determine patient infection status.  Positive results do  not rule out bacterial infection or co-infection with other viruses. If result is PRESUMPTIVE  POSTIVE SARS-CoV-2 nucleic acids MAY BE PRESENT.   A presumptive positive result was obtained on the submitted specimen  and confirmed on repeat testing.  While 2019 novel coronavirus  (SARS-CoV-2) nucleic acids may be present in the submitted sample  additional confirmatory testing may be necessary for epidemiological  and / or clinical management purposes  to differentiate between  SARS-CoV-2 and other Sarbecovirus currently known to infect humans.  If clinically indicated additional testing with an alternate test  methodology [phone]) is advised. The SARS-CoV-2 RNA is generally  detectable in upper and lower respiratory sp ecimens during the acute  phase of infection. The expected result is Negative. Fact Sheet for Patients:  StrictlyIdeas.no Fact Sheet for Healthcare Providers: BankingDealers.co.za This test is not yet approved or cleared by the [place] FDA and has been authorized for detection and/or diagnosis of SARS-CoV-2 by FDA under an Emergency Use Authorization (EUA).  This EUA will remain in effect (meaning this test can be used) for the duration of the COVID-19 declaration under Section 564(b)(1) of the Act, 21 U.S.C. section 360bbb-3(b)(1), unless the authorization is terminated or revoked sooner. Performed at [facility], [place] [address], [place], [place] [address]   Surgical PCR screen     Status: None   Collection Time: 08/06/18  7:42 AM   Specimen: Nasal Mucosa; Nasal Swab  Result Value Ref Range Status   MRSA, PCR NEGATIVE NEGATIVE Final   Staphylococcus aureus NEGATIVE NEGATIVE Final    Comment: (NOTE) The Xpert SA Assay (FDA approved for NASAL specimens in patients 63 years of age and older), is one component of a comprehensive surveillance program. It is not intended to diagnose infection nor to guide or monitor treatment. Performed at [facility], [place] [address] [place], [place] [address]      Studies: Dg Ercp Biliary & Pancreatic Ducts  Result Date: 08/07/2018 CLINICAL DATA:  78 year old male with a history of choledocholithiasis EXAM: INTRAOPERATIVE CHOLANGIOGRAM TECHNIQUE:  Cholangiographic images from the C-arm fluoroscopic device were submitted for interpretation post-operatively. Please see the procedural report for the amount of contrast and the fluoroscopy time utilized. COMPARISON:  08/06/2018, CT 08/04/2018 FINDINGS: Limited intraoperative fluoroscopic spot images during ERCP. Initial image demonstrates endoscope projecting over the upper abdomen. Surgical changes of cholecystectomy. There is then passage of safety wire into the extrahepatic biliary ducts and partial opacification with retrograde contrast. Multiple rounded filling defects within the extrahepatic biliary system. Incomplete opacification of the ductal system. IMPRESSION: Limited images during ERCP demonstrates partial opacification of the extrahepatic biliary system with small rounded filling defects, potentially air bubbles or stones/debris. Please refer to the dictated operative report for full details of intraoperative findings and procedure. Electronically Signed   By: Corrie Mckusick D.O.   On: 08/07/2018 10:25    Scheduled Meds: . amoxicillin-clavulanate  1 tablet Oral Q12H  . apixaban  5 mg Oral BID  . diltiazem  240 mg Oral Daily  . mupirocin ointment  1 application Nasal BID  . pantoprazole (PROTONIX) IV  40 mg Intravenous Q12H   Continuous Infusions:  Principal Problem:   Elevated liver enzymes Active Problems:   Paroxysmal A-fib (HCC)   Acute abdominal pain   AKI (acute kidney injury) ([place])   Gallstones      Shayla D Nettey  [facility]

## 2018-08-08 NOTE — Progress Notes (Addendum)
Patient ID: Derek Curry, male   DOB: December 23, 1940, 78 y.o.   MRN: [id number]    Progress Note   Subjective  Day # 2 post lap chole Day #1 post ERCP stone extraction x 4  LFT's normalizing BC + Ecoli/Enterobacter  On Unasyn  Pt feels well , eating solid food , no c/o pain or nausea    Objective   Vital signs in last 24 hours: Temp:  [97.1 F (36.2 C)-98.6 F (37 C)] 97.9 F (36.6 C) (06/24 0447) Pulse Rate:  [59-73] 59 (06/24 0447) Resp:  [14-21] 18 (06/24 0447) BP: (117-147)/(65-88) 131/83 (06/24 0447) SpO2:  [97 %-100 %] 98 % (06/24 0447) Last BM Date: 08/06/18 General:     Elderly AA male in NAD Heart:  Regular rate and rhythm; no murmurs Lungs: Respirations even and unlabored, lungs CTA bilaterally Abdomen:  Soft, nontender and nondistended. Normal bowel sounds. Extremities:  Without edema. Neurologic:  Alert and oriented,  grossly normal neurologically. Psych:  Cooperative. Normal mood and affect.  Intake/Output from previous day: 06/23 0701 - 06/24 0700 In: 4054.8 [P.O.:840; I.V.:2931.7; IV Piggyback:283.1] Out: 1150 [Urine:1150] Intake/Output this shift: Total I/O In: -  Out: 375 [Urine:375]  Lab Results: Recent Labs    08/06/18 0734 08/07/18 0537 08/08/18 0029  WBC 7.4 10.1 11.6*  HGB 11.0* 11.6* 11.8*  HCT 33.9* 36.4* 36.5*  PLT 146* 158 175   BMET Recent Labs    08/06/18 0734 08/07/18 0537 08/08/18 0029  NA 138 136 135  K 3.9 4.3 5.1  CL 107 105 106  CO2 21* 20* 17*  GLUCOSE 92 99 156*  BUN 13 11 19   CREATININE 1.17 1.11 1.09  CALCIUM 9.0 9.1 9.1   LFT Recent Labs    08/08/18 0029  PROT 6.9  ALBUMIN 2.6*  AST 47*  ALT 58*  ALKPHOS 117  BILITOT 1.4*   PT/INR No results for input(s): LABPROT, INR in the last 72 hours.  Studies/Results: Dg Cholangiogram Operative  Result Date: 08/06/2018 CLINICAL DATA:  Cholecystectomy for cholelithiasis. Abnormal liver function tests. EXAM: INTRAOPERATIVE CHOLANGIOGRAM TECHNIQUE:  Cholangiographic images from the C-arm fluoroscopic device were submitted for interpretation post-operatively. Please see the procedural report for the amount of contrast and the fluoroscopy time utilized. COMPARISON:  Ultrasound and CT studies on 08/04/2018 FINDINGS: Intraoperative imaging demonstrates sluggish flow of contrast through the distal CBD with suggestion of probable filling defects in the distal CBD. No contrast is seen to enter the duodenum. No contrast extravasation. IMPRESSION: Intraoperative cholangiogram demonstrates probable filling defects in the distal CBD and lack of contrast entering the duodenum. Findings likely reflect choledocholithiasis. Electronically Signed   By: Aletta Edouard M.D.   On: 08/06/2018 14:29   Dg Ercp Biliary & Pancreatic Ducts  Result Date: 08/07/2018 CLINICAL DATA:  78 year old male with a history of choledocholithiasis EXAM: INTRAOPERATIVE CHOLANGIOGRAM TECHNIQUE: Cholangiographic images from the C-arm fluoroscopic device were submitted for interpretation post-operatively. Please see the procedural report for the amount of contrast and the fluoroscopy time utilized. COMPARISON:  08/06/2018, CT 08/04/2018 FINDINGS: Limited intraoperative fluoroscopic spot images during ERCP. Initial image demonstrates endoscope projecting over the upper abdomen. Surgical changes of cholecystectomy. There is then passage of safety wire into the extrahepatic biliary ducts and partial opacification with retrograde contrast. Multiple rounded filling defects within the extrahepatic biliary system. Incomplete opacification of the ductal system. IMPRESSION: Limited images during ERCP demonstrates partial opacification of the extrahepatic biliary system with small rounded filling defects, potentially air bubbles or stones/debris. Please refer to  the dictated operative report for full details of intraoperative findings and procedure. Electronically Signed   By: Corrie Mckusick D.O.   On:  08/07/2018 10:25       Assessment / Plan:    #13 78 year old male with E. coli/Enterobacter bacteremia, biliary in origin probable early cholangitis.  Patient doing well status post laparoscopic cholecystectomy on 08/06/2018 and ERCP with stone extraction x4 yesterday.  LFTs are quickly normalizing, patient has been afebrile, WBC 11.8 today. Day #4 IV antibiotics, currently on Unasyn.  Will need to complete 10 to 14-day course of antibiotics.  #2 history of atrial fibrillation #3 chronic anticoagulation-on Eliquis at home.,  Heparin was started today,  Plan; Complete 5 days of IV antibiotics, then switch to oral antibiotics for home.  Ok to restart Eliquis today from [employer] standpoint.  Patient is stable from GI perspective we will sign off and available if needed, thank you.         Principal Problem:   Elevated liver enzymes Active Problems:   Paroxysmal A-fib (HCC)   Acute abdominal pain   AKI (acute kidney injury) ([place])   Gallstones     LOS: 4 days   [name] PA-C 08/08/2018, 9:11 AM     Attending physician's note   I have taken an interval history, reviewed the chart and examined the patient. I agree with the Advanced Practitioner's note, impression and recommendations.   Choledocholithiasis status post biliary sphincterotomy with stone extraction.  Doing very well.  No abdominal pain.  Plan: -Can restart Eliquis from today onwards.  Misprint in the report.  It was supposed to say 3 doses rather than 3 days. -We will sign off for now.  Carmell Austria, MD [facility] [phone].

## 2018-08-08 NOTE — Patient Outreach (Signed)
  [facility]) Care Management Chronic Special Needs Program   08/08/2018  Name: Morad Tal, DOB: [birth year]  MRN: [id number]  The client was discussed in today's interdisciplinary care team meeting.  The following issues were discussed:  Changes in health status, Coordination of care and Care transitions  Participants present:   Thea Silversmith, MSN, RN, CCM   Melissa Sandlin RN, BSN, CCM, CDE     Marco Collie, MD Maryella Shivers, MD  Kelli Churn, RN, CCM, CDE Gilda Crease, PharmD, RPh  Recommendations:  [employer] utilization management team to make transition of care call and will follow up as indicated    Follow-up:  Per scheduled outreach or sooner if needed Peter Garter RN, [name], [place] Management Coordinator [place] [facility] Management [phone]

## 2018-08-08 NOTE — Progress Notes (Signed)
1 Day Post-Op  Subjective: CC: Patient reports that his abdominal pain is minimal and only around incision. He is tolerating his HH/Carb modified diet without N/V or increased abdominal pain. Voiding on own without difficulty. Passing flatus. Mobilizing in room.   Objective: Vital signs in last 24 hours: Temp:  [97.9 F (36.6 C)-98.6 F (37 C)] 97.9 F (36.6 C) (06/24 0447) Pulse Rate:  [59-60] 59 (06/24 0447) Resp:  [18] 18 (06/24 0447) BP: (117-147)/(73-88) 131/83 (06/24 0447) SpO2:  [97 %-99 %] 98 % (06/24 0447) Last BM Date: 08/06/18  Intake/Output from previous day: 06/23 0701 - 06/24 0700 In: 4054.8 [P.O.:840; I.V.:2931.7; IV Piggyback:283.1] Out: 1150 [Urine:1150] Intake/Output this shift: Total I/O In: -  Out: 375 [Urine:375]  PE: Gen: Awake and alert,NAD Lungs: Normal effort and rate Abd: Protuberant but ND, soft, mild tenderness around umbilical incision, otherwise NT. No r/r/g. +bs. Laparoscopic incisions with dermabond over and appear c/d/i.   Lab Results:  Recent Labs    08/07/18 0537 08/08/18 0029  WBC 10.1 11.6*  HGB 11.6* 11.8*  HCT 36.4* 36.5*  PLT 158 175   BMET Recent Labs    08/07/18 0537 08/08/18 0029  NA 136 135  K 4.3 5.1  CL 105 106  CO2 20* 17*  GLUCOSE 99 156*  BUN 11 19  CREATININE 1.11 1.09  CALCIUM 9.1 9.1   PT/INR No results for input(s): LABPROT, INR in the last 72 hours. CMP     Component Value Date/Time   NA 135 08/08/2018 0029   K 5.1 08/08/2018 0029   CL 106 08/08/2018 0029   CO2 17 (L) 08/08/2018 0029   GLUCOSE 156 (H) 08/08/2018 0029   BUN 19 08/08/2018 0029   CREATININE 1.09 08/08/2018 0029   CALCIUM 9.1 08/08/2018 0029   PROT 6.9 08/08/2018 0029   ALBUMIN 2.6 (L) 08/08/2018 0029   AST 47 (H) 08/08/2018 0029   ALT 58 (H) 08/08/2018 0029   ALKPHOS 117 08/08/2018 0029   BILITOT 1.4 (H) 08/08/2018 0029   GFRNONAA >60 08/08/2018 0029   GFRAA >60 08/08/2018 0029   Lipase     Component Value Date/Time    LIPASE 17 08/07/2018 0537       Studies/Results: Dg Cholangiogram Operative  Result Date: 08/06/2018 CLINICAL DATA:  Cholecystectomy for cholelithiasis. Abnormal liver function tests. EXAM: INTRAOPERATIVE CHOLANGIOGRAM TECHNIQUE: Cholangiographic images from the C-arm fluoroscopic device were submitted for interpretation post-operatively. Please see the procedural report for the amount of contrast and the fluoroscopy time utilized. COMPARISON:  Ultrasound and CT studies on 08/04/2018 FINDINGS: Intraoperative imaging demonstrates sluggish flow of contrast through the distal CBD with suggestion of probable filling defects in the distal CBD. No contrast is seen to enter the duodenum. No contrast extravasation. IMPRESSION: Intraoperative cholangiogram demonstrates probable filling defects in the distal CBD and lack of contrast entering the duodenum. Findings likely reflect choledocholithiasis. Electronically Signed   By: Aletta Edouard M.D.   On: 08/06/2018 14:29   Dg Ercp Biliary & Pancreatic Ducts  Result Date: 08/07/2018 CLINICAL DATA:  78 year old male with a history of choledocholithiasis EXAM: INTRAOPERATIVE CHOLANGIOGRAM TECHNIQUE: Cholangiographic images from the C-arm fluoroscopic device were submitted for interpretation post-operatively. Please see the procedural report for the amount of contrast and the fluoroscopy time utilized. COMPARISON:  08/06/2018, CT 08/04/2018 FINDINGS: Limited intraoperative fluoroscopic spot images during ERCP. Initial image demonstrates endoscope projecting over the upper abdomen. Surgical changes of cholecystectomy. There is then passage of safety wire into the extrahepatic biliary ducts  and partial opacification with retrograde contrast. Multiple rounded filling defects within the extrahepatic biliary system. Incomplete opacification of the ductal system. IMPRESSION: Limited images during ERCP demonstrates partial opacification of the extrahepatic biliary system  with small rounded filling defects, potentially air bubbles or stones/debris. Please refer to the dictated operative report for full details of intraoperative findings and procedure. Electronically Signed   By: Corrie Mckusick D.O.   On: 08/07/2018 10:25    Anti-infectives: Anti-infectives (From admission, onward)   Start     Dose/Rate Route Frequency Ordered Stop   08/07/18 1200  Ampicillin-Sulbactam (UNASYN) 3 g in sodium chloride 0.9 % 100 mL IVPB     3 g 200 mL/hr over 30 Minutes Intravenous Every 6 hours 08/07/18 1150     08/06/18 0745  cefTRIAXone (ROCEPHIN) 2 g in sodium chloride 0.9 % 100 mL IVPB  Status:  Discontinued     2 g 200 mL/hr over 30 Minutes Intravenous On call to O.R. 08/06/18 0740 08/06/18 0740   08/04/18 1400  piperacillin-tazobactam (ZOSYN) IVPB 3.375 g  Status:  Discontinued     3.375 g 12.5 mL/hr over 240 Minutes Intravenous Every 8 hours 08/04/18 0746 08/07/18 1150   08/04/18 0445  piperacillin-tazobactam (ZOSYN) IVPB 3.375 g     3.375 g 100 mL/hr over 30 Minutes Intravenous  Once 08/04/18 0430 08/04/18 6122       Assessment/Plan Choledocholithiasis POD 2, s/p lap chole with IOC, Dr. Marlou Starks, 08/06/2018 POD 1, s/p ERCP, Dr. Lyndel Safe - On POD 2, the patient was voiding well, tolerating diet, ambulating well, pain well controlled with oral medications, vital signs stable, incisions c/d/i and felt stable for discharge home from a surgical standpoint. He may resume eliquis from our standpoint whenever. Hgb stable. GI note on 6/23 recommends holding for 3 days. He does not need any additional abx from a surgical standpoint as he did not have cholecystitis. Per GI notes, he is recommended to have 5 days of IV abx and switch to oral abx for a 10-14 day course. Follow up has already been arranged. Will send pain medications to pharmacy. Post-op instructions were reviewed with the patient and attached to his chart. We will follow peripherally until d/c.  -mobilize and pulm toilet  while still in hospital.  FEN - HH/Carb mod diet VTE - SCDs, Heparin, Mobilize  ID - Unasyn 6/23>>    LOS: 4 days    Jillyn Ledger , [facility] 08/08/2018, 10:20 AM Pager: [phone]

## 2018-08-08 NOTE — Progress Notes (Signed)
Pharmacy: heparin --> Eliquis  Patient's a 78 y.o M with hx afib on Eliquis PTA, admitted for abd pain.  Anticoag. changed to heparin drip on admission for lap chole on 6/22 and ERCP on 6/23.  Per MD's request, to change Eliquis on 6/24.  Plan: - d/c heparin drip - resume home Eliquis regimen of 5 mg bid - pharmacy will sign off for Eliquis, but will follow patient peripherally along with you.  Dia Sitter, PharmD, BCPS 08/08/2018 12:43 PM

## 2018-08-08 NOTE — Progress Notes (Signed)
[place] for heparin Indication: atrial fibrillation  Allergies  Allergen Reactions  . Benazepril Anaphylaxis  . Shellfish Allergy Swelling  . Vicodin [Hydrocodone-Acetaminophen] Other (See Comments)    Dizzy    Patient Measurements: Height: 5\' 8"  (172.7 cm) Weight: 173 lb (78.5 kg) IBW/kg (Calculated) : 68.4 Heparin Dosing Weight = TBW = 78 kg  Vital Signs: Temp: 98.1 F (36.7 C) (06/23 2238) Temp Source: Oral (06/23 1332) BP: 147/88 (06/23 2238) Pulse Rate: 59 (06/23 2238)  Labs: Recent Labs    08/05/18 0353 08/05/18 1150 08/05/18 2300 08/06/18 0734 08/07/18 0537 08/08/18 0029  HGB 10.8*  --   --  11.0* 11.6* 11.8*  HCT 33.1*  --   --  33.9* 36.4* 36.5*  PLT 138*  --   --  146* 158 175  APTT 75* 52* 59*  --   --   --   HEPARINUNFRC 0.50 0.30 0.24* 0.28*  --  0.16*  CREATININE 1.30*  --   --  1.17 1.11 1.09    Estimated Creatinine Clearance: 54.9 mL/min (by C-G formula based on SCr of 1.09 mg/dL).  Assessment: Patient admitted with abdominal pain - surgery consulted. Planning for possible surgical intervention on Monday. Pt was taking apixaban 5 mg PO BID PTA for atrial fibrillation. Anticoagulation being converted to heparin drip in anticipation of surgery.  Last dose of apixaban: 6/19 @ 2200  Significant events: 6/22: Heparin off for lap chole in AM - d/t bleeding from procedure wanted heparin started following morning 6/23: heparin not started in AM, continue to hold for ERCP  6/23:  Heparin currently off for ERCP, most recent level slightly subtherapeutic on 1250 units/hr  CBC stable from yesterday  GI ok to resume heparin 6 hrs post-procedure Today, 6/24  104 HL = 0.16 below goal, no infusion issues or bleeding per RN.  Goal of Therapy:  Heparin level 0.3-0.7 units/ml Monitor platelets by anticoagulation protocol: Yes   Plan:   Increase heparin drip to 1500 units/hr  Check 8-hr heparin level  CBC  daily while on heparin  Monitor for signs of bleeding  Dorrene German 08/08/2018 1:13 AM

## 2018-08-09 ENCOUNTER — Encounter (HOSPITAL_COMMUNITY): Payer: Self-pay | Admitting: Gastroenterology

## 2018-08-09 DIAGNOSIS — R509 Fever, unspecified: Secondary | ICD-10-CM

## 2018-08-09 DIAGNOSIS — R1084 Generalized abdominal pain: Secondary | ICD-10-CM

## 2018-08-09 LAB — CBC
HCT: 31.6 % — ABNORMAL LOW (ref 39.0–52.0)
Hemoglobin: 10.4 g/dL — ABNORMAL LOW (ref 13.0–17.0)
MCH: 34.1 pg — ABNORMAL HIGH (ref 26.0–34.0)
MCHC: 32.9 g/dL (ref 30.0–36.0)
MCV: 103.6 fL — ABNORMAL HIGH (ref 80.0–100.0)
Platelets: 182 10*3/uL (ref 150–400)
RBC: 3.05 MIL/uL — ABNORMAL LOW (ref 4.22–5.81)
RDW: 14 % (ref 11.5–15.5)
WBC: 9 10*3/uL (ref 4.0–10.5)
nRBC: 0 % (ref 0.0–0.2)

## 2018-08-09 LAB — BASIC METABOLIC PANEL
Anion gap: 5 (ref 5–15)
BUN: 25 mg/dL — ABNORMAL HIGH (ref 8–23)
CO2: 21 mmol/L — ABNORMAL LOW (ref 22–32)
Calcium: 9.1 mg/dL (ref 8.9–10.3)
Chloride: 109 mmol/L (ref 98–111)
Creatinine, Ser: 1.05 mg/dL (ref 0.61–1.24)
GFR calc Af Amer: >60 mL/min (ref >60)
GFR calc non Af Amer: >60 mL/min (ref >60)
Glucose, Bld: 146 mg/dL — ABNORMAL HIGH (ref 70–99)
Potassium: 4.3 mmol/L (ref 3.5–5.1)
Sodium: 135 mmol/L (ref 135–145)

## 2018-08-09 LAB — GLUCOSE, CAPILLARY: Glucose-Capillary: 119 mg/dL — ABNORMAL HIGH (ref 70–99)

## 2018-08-09 MED ORDER — AMOXICILLIN-POT CLAVULANATE 875-125 MG PO TABS: 1.0000 | ORAL_TABLET | Freq: Two times a day (BID) | ORAL | 0 refills | Status: DC

## 2018-08-09 NOTE — Discharge Instructions (Signed)
[facility] ° °Please arrive at least 30 min before your appointment to complete your check in paperwork.  If you are unable to arrive 30 min prior to your appointment time we may have to cancel or reschedule you. °LAPAROSCOPIC SURGERY: POST OP INSTRUCTIONS °Always review your discharge instruction sheet given to you by the facility where your surgery was performed. °IF YOU HAVE DISABILITY OR FAMILY LEAVE FORMS, YOU MUST BRING THEM TO THE OFFICE FOR PROCESSING.   °DO NOT GIVE THEM TO YOUR DOCTOR. ° °PAIN CONTROL ° °1. First take acetaminophen (Tylenol) AND/or ibuprofen (Advil) to control your pain after surgery.  Follow directions on package.  Taking acetaminophen (Tylenol) and/or ibuprofen (Advil) regularly after surgery will help to control your pain and lower the amount of prescription pain medication you may need.  You should not take more than 4,000 mg (4 grams) of acetaminophen (Tylenol) in 24 hours.  You should not take ibuprofen (Advil), aleve, motrin, naprosyn or other NSAIDS if you have a history of stomach ulcers or chronic kidney disease.  °2. A prescription for pain medication may be given to you upon discharge.  Take your pain medication as prescribed, if you still have uncontrolled pain after taking acetaminophen (Tylenol) or ibuprofen (Advil). °3. Use ice packs to help control pain. °4. If you need a refill on your pain medication, please contact your pharmacy.  They will contact our office to request authorization. Prescriptions will not be filled after 5pm or on week-ends. ° °HOME MEDICATIONS °5. Take your usually prescribed medications unless otherwise directed. ° °DIET °6. You should follow a light diet the first few days after arrival home.  Be sure to include lots of fluids daily. Avoid fatty, fried foods.  ° °CONSTIPATION °7. It is common to experience some constipation after surgery and if you are taking pain medication.  Increasing fluid intake and taking a stool  softener (such as Colace) will usually help or prevent this problem from occurring.  A mild laxative (Milk of Magnesia or Miralax) should be taken according to package instructions if there are no bowel movements after 48 hours. ° °WOUND/INCISION CARE °8. Most patients will experience some swelling and bruising in the area of the incisions.  Ice packs will help.  Swelling and bruising can take several days to resolve.  °9. Unless discharge instructions indicate otherwise, follow guidelines below  °a. STERI-STRIPS - you may remove your outer bandages 48 hours after surgery, and you may shower at that time.  You have steri-strips (small skin tapes) in place directly over the incision.  These strips should be left on the skin for 7-10 days.   °b. DERMABOND/SKIN GLUE - you may shower in 24 hours.  The glue will flake off over the next 2-3 weeks. °10. Any sutures or staples will be removed at the office during your follow-up visit. ° °ACTIVITIES °11. You may resume regular (light) daily activities beginning the next day--such as daily self-care, walking, climbing stairs--gradually increasing activities as tolerated.  You may have sexual intercourse when it is comfortable.  Refrain from any heavy lifting or straining until approved by your doctor. °a. You may drive when you are no longer taking prescription pain medication, you can comfortably wear a seatbelt, and you can safely maneuver your car and apply brakes. ° °FOLLOW-UP °12. You should see your doctor in the office for a follow-up appointment approximately 2-3 weeks after your surgery.  You should have been given your post-op/follow-up appointment when   your surgery was scheduled.  If you did not receive a post-op/follow-up appointment, make sure that you call for this appointment within a day or two after you arrive home to insure a convenient appointment time. ° ° °WHEN TO CALL YOUR DOCTOR: °1. Fever over 101.0 °2. Inability to urinate °3. Continued bleeding from  incision. °4. Increased pain, redness, or drainage from the incision. °5. Increasing abdominal pain ° °The clinic staff is available to answer your questions during regular business hours.  Please don’t hesitate to call and ask to speak to one of the nurses for clinical concerns.  If you have a medical emergency, go to the nearest emergency room or call 911.  A surgeon from [facility] is always on call at the hospital. °[address], [address], [place], [place]  [address] ? [address] [address], [place], [place]   [address] °[phone] ? [phone] ? FAX [phone] ° °••••••••• ° ° °Managing Your Pain After Surgery Without Opioids ° ° ° °Thank you for participating in our program to help patients manage their pain after surgery without opioids. This is part of our effort to provide you with the best care possible, without exposing you or your family to the risk that opioids pose. ° °What pain can I expect after surgery? °You can expect to have some pain after surgery. This is normal. The pain is typically worse the day after surgery, and quickly begins to get better. °Many studies have found that many patients are able to manage their pain after surgery with Over-the-Counter (OTC) medications such as Tylenol and Motrin. If you have a condition that does not allow you to take Tylenol or Motrin, notify your surgical team. ° °How will I manage my pain? °The best strategy for controlling your pain after surgery is around the clock pain control with Tylenol (acetaminophen) and Motrin (ibuprofen or Advil). Alternating these medications with each other allows you to maximize your pain control. In addition to Tylenol and Motrin, you can use heating pads or ice packs on your incisions to help reduce your pain. ° °How will I alternate your regular strength over-the-counter pain medication? °You will take a dose of pain medication every three hours. °; Start by taking 650 mg of Tylenol (2 pills of 325  mg) °; 3 hours later take 600 mg of Motrin (3 pills of 200 mg) °; 3 hours after taking the Motrin take 650 mg of Tylenol °; 3 hours after that take 600 mg of Motrin. ° ° °- 1 - ° °See example - if your first dose of Tylenol is at 12:00 PM ° ° °12:00 PM Tylenol 650 mg (2 pills of 325 mg)  °3:00 PM Motrin 600 mg (3 pills of 200 mg)  °6:00 PM Tylenol 650 mg (2 pills of 325 mg)  °9:00 PM Motrin 600 mg (3 pills of 200 mg)  °Continue alternating every 3 hours  ° °We recommend that you follow this schedule around-the-clock for at least 3 days after surgery, or until you feel that it is no longer needed. Use the table on the last page of this handout to keep track of the medications you are taking. °Important: °Do not take more than 3000mg of Tylenol or 3200mg of Motrin in a 24-hour period. °Do not take ibuprofen/Motrin if you have a history of bleeding stomach ulcers, severe kidney disease, &/or actively taking a blood thinner ° °What if I still have pain? °If you have pain that is not   controlled with the over-the-counter pain medications (Tylenol and Motrin or Advil) you might have what we call “breakthrough” pain. You will receive a prescription for a small amount of an opioid pain medication such as Oxycodone, Tramadol, or Tylenol with Codeine. Use these opioid pills in the first 24 hours after surgery if you have breakthrough pain. Do not take more than 1 pill every 4-6 hours. ° °If you still have uncontrolled pain after using all opioid pills, don't hesitate to call our staff using the number provided. We will help make sure you are managing your pain in the best way possible, and if necessary, we can provide a prescription for additional pain medication. ° ° °Day 1   ° °Time  °Name of Medication Number of pills taken  °Amount of Acetaminophen  °Pain Level  ° °Comments  °AM PM       °AM PM       °AM PM       °AM PM       °AM PM       °AM PM       °AM PM       °AM PM       °Total Daily amount of Acetaminophen °Do not  take more than  3,000 mg per day    ° ° °Day 2   ° °Time  °Name of Medication Number of pills °taken  °Amount of Acetaminophen  °Pain Level  ° °Comments  °AM PM       °AM PM       °AM PM       °AM PM       °AM PM       °AM PM       °AM PM       °AM PM       °Total Daily amount of Acetaminophen °Do not take more than  3,000 mg per day    ° ° °Day 3   ° °Time  °Name of Medication Number of pills taken  °Amount of Acetaminophen  °Pain Level  ° °Comments  °AM PM       °AM PM       °AM PM       °AM PM       ° ° ° °AM PM       °AM PM       °AM PM       °AM PM       °Total Daily amount of Acetaminophen °Do not take more than  3,000 mg per day    ° ° °Day 4   ° °Time  °Name of Medication Number of pills taken  °Amount of Acetaminophen  °Pain Level  ° °Comments  °AM PM       °AM PM       °AM PM       °AM PM       °AM PM       °AM PM       °AM PM       °AM PM       °Total Daily amount of Acetaminophen °Do not take more than  3,000 mg per day    ° ° °Day 5   ° °Time  °Name of Medication Number °of pills taken  °Amount of Acetaminophen  °Pain Level  ° °Comments  °AM PM       °AM PM       °AM   PM       AM PM       AM PM       AM PM       AM PM       AM PM       Total Daily amount of Acetaminophen Do not take more than  3,000 mg per day       Day 6    Time  Name of Medication Number of pills taken  Amount of Acetaminophen  Pain Level  Comments  AM PM       AM PM       AM PM       AM PM       AM PM       AM PM       AM PM       AM PM       Total Daily amount of Acetaminophen Do not take more than  3,000 mg per day      Day 7    Time  Name of Medication Number of pills taken  Amount of Acetaminophen  Pain Level   Comments  AM PM       AM PM       AM PM       AM PM       AM PM       AM PM       AM PM       AM PM       Total Daily amount of Acetaminophen Do not take more than  3,000 mg per day        For additional information about how and where to safely dispose of unused  opioid medications - [employer]  Disclaimer: This document contains information and/or instructional materials adapted from [place] for the typical patient with your condition. It does not replace medical advice from your health care provider because your experience may differ from that of the typical patient. Talk to your health care provider if you have any questions about this document, your condition or your treatment plan. Adapted from [place] on my medicine - ELIQUIS (apixaban)  This medication education was reviewed with me or my healthcare representative as part of my discharge preparation.  The pharmacist that spoke with me during my hospital stay was:  Onnie Boer, RPH-CPP  Why was Eliquis prescribed for you? Eliquis was prescribed for you to reduce the risk of a blood clot forming that can cause a stroke if you have a medical condition called atrial fibrillation (a type of irregular heartbeat).  What do You need to know about Eliquis ? Take your Eliquis TWICE DAILY - one tablet in the morning and one tablet in the evening with or without food. If you have difficulty swallowing the tablet whole please discuss with your pharmacist how to take the medication safely.  Take Eliquis exactly as prescribed by your doctor and DO NOT stop taking Eliquis without talking to the doctor who prescribed the medication.  Stopping may increase your risk of developing a stroke.  Refill your prescription before you run out.  After discharge, you should have regular check-up appointments with your healthcare provider that is prescribing your Eliquis.  In the future your dose may need to be changed if your kidney function or weight changes by a significant amount or as you get older.  What do you do if you miss a dose? If  you miss a dose, take it as soon as you remember on the same day and resume taking twice daily.  Do not take more than one  dose of ELIQUIS at the same time to make up a missed dose.  Important Safety Information A possible side effect of Eliquis is bleeding. You should call your healthcare provider right away if you experience any of the following: ? Bleeding from an injury or your nose that does not stop. ? Unusual colored urine (red or dark brown) or unusual colored stools (red or black). ? Unusual bruising for unknown reasons. ? A serious fall or if you hit your head (even if there is no bleeding).  Some medicines may interact with Eliquis and might increase your risk of bleeding or clotting while on Eliquis. To help avoid this, consult your healthcare provider or pharmacist prior to using any new prescription or non-prescription medications, including herbals, vitamins, non-steroidal anti-inflammatory drugs (NSAIDs) and supplements.  This website has more information on Eliquis (apixaban): http://www.eliquis.com/eliquis/home

## 2018-08-09 NOTE — Progress Notes (Signed)
Patient's oxygen saturation at rest was 100 % on room air. He ambulated approximately 180 ft and saturation dropped to 99%. Eulas Post, RN

## 2018-08-09 NOTE — Discharge Summary (Signed)
Discharge Summary  Derek Curry OVF:[id number] DOB: [birth year]  PCP: Jefm Petty, MD  Admit date: 08/04/2018 Discharge date: 08/09/2018   Time spent: < 25 minutes  Admitted From: home Disposition:  home  Recommendations for Outpatient Follow-up:  1. Follow up with PCP in 1 week. Will need repeat CMP ( creatinine, LFTs, bilirubin check) 2. Augmentin to continue 7 days post discharge 3. Surgery provided short course of oxycodone 5 mg q6 hrs pain PRN 4. Instructed to hold simvastatin until PCP follow up for LFTs check    Discharge Diagnoses:  Active Hospital Problems   Diagnosis Date Noted   Elevated liver enzymes 08/04/2018   Gallstones 08/05/2018   Acute abdominal pain 08/04/2018   AKI (acute kidney injury) ([place]) 08/04/2018   Paroxysmal A-fib ([place]) 03/10/2015    Resolved Hospital Problems   Diagnosis Date Noted Date Resolved   Acute cholecystitis 08/04/2018 08/05/2018    Discharge Condition: Stable   CODE STATUS:FULL CODE   History of present illness:  Derek Curry is a 78 y.o. year old male with medical history significant for DM, HTN, Afib,Gout  who presented on 08/04/2018 with acute abdominal pain after a fatty meal..  He had noticed epigastric pain a week prior that lasted 2 to 3 hours.  He had no nausea or vomiting.  Also reported dark color to his urine for 4 days prior to admission.  He presented after eating fried chicken, ribs, potato salad and cherry pie pie for dinner with worsening upper abdominal pain before ultimately presenting today for his [place] ED for further evaluation.  Labs there showed elevated T bili and LFTs which prompted transfer to [facility] long ED evaluation.  In the ED he was febrile to 102.7, lab work notable for AST 244, AST 132, total bilirubin 2.8.  He was found to have acute cholecystitis, choledocholithiasis and E.coli bacteremia. Remaining hospital course addressed in problem based format below:   Hospital Course:    1. Acute cholecystitis and choledocholithiasis.    Right upper quadrant ultrasound that showed cholelithiasis without sonographic evidence of acute cholecystitis, CBD diameter 5 millimeters with no biliary ductal dilatation.  CT abdomen showed possible gallbladder wall thickening again without biliary dilatation.  Zosyn was started for coverage for potential cholangitis, MRCP cannot be obtained due to pacemaker position as well and that was not MRI compatible. He underwent laparoscopic cholecystectomy (6/22) by surgery followed by ERCP with biliary sphincterotomy and stone extraction (6/23) with resolution in abdominal pain and improvement in LFTs.  Stable from surgical and GI perspective.Outpatient surgical follow-up arranged.  He will continue Augmentin to complete course of antibiotic therapy on discharge  2. E. coli bacteremia.  Possible translocation from early cholangitis.  Transitioned from IV Unasyn to Augmentin (completed 4 days of IV antibiotics)-for total 10-day course  3. Mild LFT elevation, improving. Related to CBD stone s/p extraction.  On admission AST 244, ALT 132, alk phos249 with cholecystectomy and stone extraction LFTs improved prior to discharge AST 47, ALT 58, alk phos 117. held home statin, will advise CMP check on PCP follow-up  4. Atrial fibrillation, rate controlled.    Was intermittently on heparin during abdominal procedures, tolerated transition back to home Eliquis prior to discharge without any bleeding.  Continue his diltiazem  5. AKI, Presumed prerenal related to diminished p.o. intake in the setting of abdominal pain related to #1.  Peak creatinine of 1.65 on admission returned to baseline of 1.05 with IV fluids.  With resolution of abdominal pain eventually able  to tolerate diet  6. HTN, stable held home lisinopril/hctz due to [facility] which resolved.  Will resume on discharge  7. Gout, allopurinol held on admission, resume at discharge  8. T2DM. stable during  hospital stay, resume home medications  9. HLD, held statin with elevated LFTs, continue to hold on discharge, until seen by PCP  10. GERD. resume home oral ppi  11. Macrocytic anemia, hgb stable, no bleeding   Consultations: Gastroenterology, surgery  Procedures/Studies: 6/22 laparoscopic cholecystectomy with intraoperative cholangiogram by Dr. Marl[place] Starks 6/23 ERCP with biliary sphincterotomy and balloon extraction  Discharge Exam: BP 125/72 (BP Location: Right Arm)    Pulse (!) 59    Temp 97.8 F (36.6 C) (Oral)    Resp 20    Ht 5' 8"  (1.727 m)    Wt 78.5 kg    SpO2 99%    BMI 26.30 kg/m    General:  Lying in bed, no distress  Cardiovascular: IRR, no edema  Respiratory: normal effort on room air  Abdomen: soft, non-tender, non-distended, normal bowel sounds, lap chole incisions healing well  Musculoskeletal: normal ROM   Skin dry, intact  Neurologic alert and oriented x4     Discharge Instructions You were cared for by a hospitalist during your hospital stay. If you have any questions about your discharge medications or the care you received while you were in the hospital after you are discharged, you can call the unit and asked to speak with the hospitalist on call if the hospitalist that took care of you is not available. Once you are discharged, your primary care physician will handle any further medical issues. Please note that NO REFILLS for any discharge medications will be authorized once you are discharged, as it is imperative that you return to your primary care physician (or establish a relationship with a primary care physician if you do not have one) for your aftercare needs so that they can reassess your need for medications and monitor your lab values.  Discharge Instructions    Diet - low sodium heart healthy   Complete by: As directed    Increase activity slowly   Complete by: As directed      Allergies as of 08/09/2018      Reactions   Benazepril  Anaphylaxis   Shellfish Allergy Swelling   Vicodin [hydrocodone-acetaminophen] Other (See Comments)   Dizzy      Medication List    STOP taking these medications   acetaminophen 650 MG CR tablet Commonly known as: TYLENOL   simvastatin 10 MG tablet Commonly known as: ZOCOR     TAKE these medications   Accu-Chek Guide test strip Generic drug: glucose blood USE TO CHECK BLOOD SUGAR TWICE A DAY   allopurinol 300 MG tablet Commonly known as: ZYLOPRIM Take 300 mg by mouth daily.   aluminum-magnesium hydroxide-simethicone 622-633-35 MG/5ML Susp Commonly known as: MAALOX Take 30 mLs by mouth 4 (four) times daily as needed (heart burn).   amoxicillin-clavulanate 875-125 MG tablet Commonly known as: AUGMENTIN Take 1 tablet by mouth every 12 (twelve) hours for 7 days.   diltiazem 240 MG 24 hr capsule Commonly known as: CARDIZEM CD Take 240 mg by mouth daily.   Eliquis 5 MG Tabs tablet Generic drug: apixaban Take 5 mg by mouth 2 (two) times daily.   fluticasone 50 MCG/ACT nasal spray Commonly known as: FLONASE Place 1 spray into both nostrils daily as needed for allergies or rhinitis.   lisinopril-hydrochlorothiazide 20-25 MG tablet Commonly known as:  ZESTORETIC Take 1 tablet by mouth daily.   Magnesium Oxide 250 MG Tabs Take 250 mg by mouth daily.   meclizine 12.5 MG tablet Commonly known as: ANTIVERT Take 12.5 mg by mouth 3 (three) times daily as needed for dizziness or nausea.   naproxen sodium 220 MG tablet Commonly known as: ALEVE Take 440 mg by mouth 2 (two) times daily as needed (pain).   omeprazole 40 MG capsule Commonly known as: PRILOSEC Take 40 mg by mouth daily.   oxyCODONE 5 MG immediate release tablet Commonly known as: Roxicodone Take 1 tablet (5 mg total) by mouth every 6 (six) hours as needed for breakthrough pain.   valACYclovir 500 MG tablet Commonly known as: VALTREX Take 500 mg by mouth 2 (two) times daily as needed (outbreak).    zolpidem 5 MG tablet Commonly known as: AMBIEN Take 5 mg by mouth at bedtime as needed for sleep.      Allergies  Allergen Reactions   Benazepril Anaphylaxis   Shellfish Allergy Swelling   Vicodin [Hydrocodone-Acetaminophen] Other (See Comments)    Dizzy   Follow-up Information    Sur[facility], [facility] Follow up on 08/28/2018.   Specialty: General Surgery Why: The PA will call you at 9am for you follow up appointment. Please email a picture of your incision and your name to [email or web address] a few days before you appointment.  Contact information: [address] [place] [address] [phone]            The results of significant diagnostics from this hospitalization (including imaging, microbiology, ancillary and laboratory) are listed below for reference.    Significant Diagnostic Studies: Dg Cholangiogram Operative  Result Date: 08/06/2018 CLINICAL DATA:  Cholecystectomy for cholelithiasis. Abnormal liver function tests. EXAM: INTRAOPERATIVE CHOLANGIOGRAM TECHNIQUE: Cholangiographic images from the C-arm fluoroscopic device were submitted for interpretation post-operatively. Please see the procedural report for the amount of contrast and the fluoroscopy time utilized. COMPARISON:  Ultrasound and CT studies on 08/04/2018 FINDINGS: Intraoperative imaging demonstrates sluggish flow of contrast through the distal CBD with suggestion of probable filling defects in the distal CBD. No contrast is seen to enter the duodenum. No contrast extravasation. IMPRESSION: Intraoperative cholangiogram demonstrates probable filling defects in the distal CBD and lack of contrast entering the duodenum. Findings likely reflect choledocholithiasis. Electronically Signed   By: Aletta Edouard M.D.   On: 08/06/2018 14:29   Ct Abdomen Pelvis W Contrast  Result Date: 08/04/2018 CLINICAL DATA:  Diffuse abdominal pain.  Fever. EXAM: CT ABDOMEN AND PELVIS WITH CONTRAST  TECHNIQUE: Multidetector CT imaging of the abdomen and pelvis was performed using the standard protocol following bolus administration of intravenous contrast. CONTRAST:  58m OMNIPAQUE IOHEXOL 300 MG/ML  SOLN COMPARISON:  Report from abdominal CT 03/13/2014, images not available. FINDINGS: Lower chest: Linear atelectasis in both lower lobes. Pacemaker partially included. Hepatobiliary: Subcentimeter cyst in the anterior left lobe. Layering hyperdensity in the gallbladder may represent stones or sludge. Suggestion of mild gallbladder wall thickening at 4 mm. No biliary dilatation. Pancreas: Mild parenchymal atrophy. No ductal dilatation or inflammation. Spleen: Normal in size without focal abnormality. Adrenals/Urinary Tract: No adrenal nodule. No hydronephrosis or perinephric edema. Homogeneous renal enhancement with symmetric excretion on delayed phase imaging. Multiple low-density lesions in the right greater than left kidney are likely cyst, some of which are too small to characterize. Urinary bladder is physiologically distended without wall thickening. Stomach/Bowel: Prominent gastric distention with ingested material. There is normal positioning of the ligament of Treitz,  however small bowel than courses in the right abdomen. No associated bowel wall thickening, inflammation, or obstruction. Colon is normally positioned. Appendix surgically absent per history. No pericecal inflammation. Multifocal colonic diverticulosis. No diverticulitis. Mild sigmoid colonic redundancy. Vascular/Lymphatic: Normal caliber abdominal aorta. Portal vein and mesenteric vessels are patent. No adenopathy. Reproductive: Prostate is unremarkable. Other: No free air, free fluid, or intra-abdominal fluid collection. Small fat containing umbilical hernia Musculoskeletal: There are no acute or suspicious osseous abnormalities. Degenerative change in the spine. IMPRESSION: 1. Gallstones with possible gallbladder wall thickening.  Recommend right upper quadrant ultrasound for further evaluation. 2. Marked gastric distention with ingested material, raising concern for gastroparesis or delayed gastric emptying. 3. Normal positioning of the ligament of Treitz, however proximal jejunum then courses abnormally into the right abdomen. It is unclear whether this is secondary to displacement related to distended stomach versus partial small bowel malrotation. No associated bowel inflammation or obstruction. 4. Colonic diverticulosis without diverticulitis. Electronically Signed   By: Keith Rake M.D.   On: 08/04/2018 02:42   Dg Chest Port 1 View  Result Date: 08/04/2018 CLINICAL DATA:  Shortness of breath. Fever. Chills. 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 Unchanged heart size and mediastinal contours. No pulmonary edema, focal airspace disease, pleural effusion or pneumothorax. IMPRESSION: No acute chest finding. Electronically Signed   By: Keith Rake M.D.   On: 08/04/2018 01:33   Dg Ercp Biliary & Pancreatic Ducts  Result Date: 08/07/2018 CLINICAL DATA:  78 year old male with a history of choledocholithiasis EXAM: INTRAOPERATIVE CHOLANGIOGRAM TECHNIQUE: Cholangiographic images from the C-arm fluoroscopic device were submitted for interpretation post-operatively. Please see the procedural report for the amount of contrast and the fluoroscopy time utilized. COMPARISON:  08/06/2018, CT 08/04/2018 FINDINGS: Limited intraoperative fluoroscopic spot images during ERCP. Initial image demonstrates endoscope projecting over the upper abdomen. Surgical changes of cholecystectomy. There is then passage of safety wire into the extrahepatic biliary ducts and partial opacification with retrograde contrast. Multiple rounded filling defects within the extrahepatic biliary system. Incomplete opacification of the ductal system. IMPRESSION: Limited images during ERCP  demonstrates partial opacification of the extrahepatic biliary system with small rounded filling defects, potentially air bubbles or stones/debris. Please refer to the dictated operative report for full details of intraoperative findings and procedure. Electronically Signed   By: Corrie Mckusick D.O.   On: 08/07/2018 10:25   US Abdomen Limited Ruq  Result Date: 08/04/2018 CLINICAL DATA:  Ascending cholangitis EXAM: ULTRASOUND ABDOMEN LIMITED RIGHT UPPER QUADRANT COMPARISON:  08/04/2018 CT abdomen/pelvis FINDINGS: Gallbladder: No gallbladder distention. Ring down artifact in the nondependent gallbladder wall indicative of adenomyomatosis. Layering subcentimeter shadowing gallstones. No sonographic Murphy sign. No pericholecystic fluid. Common bile duct: Diameter: 5 mm Liver: No focal lesion identified. Within normal limits in parenchymal echogenicity. Portal vein is patent on color Doppler imaging with normal direction of blood flow towards the liver. IMPRESSION: 1. Cholelithiasis.  No sonographic evidence of acute cholecystitis. 2. Findings of adenomyomatosis of the gallbladder. 3. No biliary ductal dilatation. 4. Normal liver. Electronically Signed   By: Ilona Sorrel M.D.   On: 08/04/2018 05:25    Microbiology: Recent Results (from the past 240 hour(s))  SARS Coronavirus 2 (Hosp order,Performed in [facility] lab via [employer] ID)     Status: None   Collection Time: 08/04/18 12:33 AM   Specimen: Dry Nasal Swab ([employer] ID Now)  Result Value Ref Range Status   SARS Coronavirus 2 ([employer] ID  Now) NEGATIVE NEGATIVE Final    Comment: (NOTE) SARS-CoV-2 target nucleic acids are NOT DETECTED. The SARS-CoV-2 RNA is generally detectable in upper and lower respiratory specimens during the acute phase of infection.  Negativeresults do not preclude SARS-CoV-2 infection, do not rule out coinfections with other pathogens, and should not be used as the  sole basis for treatment or other patient management decisions.   Negative results must be combined with clinical observations, patient history, and epidemiological information. The expected result is Negative. Fact Sheet for Patients: GolfingFamily.no Fact Sheet for Healthcare Providers: https://www.hernandez-brewer.com/ This test is not yet approved or cleared by the [place] FDA and  has been authorized for detection and/or diagnosis of SARS-CoV-2 by FDA under an Emergency Use Authorization (EUA).  This EUA will remain in effect (meaning this test can be used) for the duration of  the COVID19 declaration under Section 5 64(b)(1) of the Act, 21 U.S.C.  section [phone] 3(b)(1), unless the authorization is terminated or revoked sooner. Performed at [facility], [address] [place], [place] [address]   Culture, blood (Routine X 2) w Reflex to ID Panel     Status: Abnormal   Collection Time: 08/04/18 12:43 AM   Specimen: BLOOD  Result Value Ref Range Status   Specimen Description   Final    BLOOD RIGHT ANTECUBITAL Performed at [facility], [address] [place], [place] [address]    Special Requests   Final    BOTTLES DRAWN AEROBIC AND ANAEROBIC Blood Culture adequate volume Performed at [facility], [address] [place], [place] [address]    Culture  Setup Time   Final    GRAM NEGATIVE RODS IN BOTH AEROBIC AND ANAEROBIC BOTTLES CRITICAL RESULT CALLED TO, READ BACK BY AND VERIFIED WITH: M.BELL,PHARMD 2337 08/04/2018 M.CAMPBELL Performed at [facility], [place] [address] [place], [place] [address]    Culture ESCHERICHIA COLI (A)  Final   Report Status 08/06/2018 FINAL  Final   Organism ID, Bacteria ESCHERICHIA COLI  Final      Susceptibility   Escherichia coli - MIC*    AMPICILLIN 8 SENSITIVE Sensitive     CEFAZOLIN <=4 SENSITIVE Sensitive     CEFEPIME <=1 SENSITIVE Sensitive     CEFTAZIDIME <=1 SENSITIVE Sensitive     CEFTRIAXONE <=1 SENSITIVE  Sensitive     CIPROFLOXACIN <=0.25 SENSITIVE Sensitive     GENTAMICIN <=1 SENSITIVE Sensitive     IMIPENEM <=0.25 SENSITIVE Sensitive     TRIMETH/SULFA <=20 SENSITIVE Sensitive     AMPICILLIN/SULBACTAM 4 SENSITIVE Sensitive     PIP/TAZO <=4 SENSITIVE Sensitive     Extended ESBL NEGATIVE Sensitive     * ESCHERICHIA COLI  Blood Culture ID Panel (Reflexed)     Status: Abnormal   Collection Time: 08/04/18 12:43 AM  Result Value Ref Range Status   Enterococcus species NOT DETECTED NOT DETECTED Final   Listeria monocytogenes NOT DETECTED NOT DETECTED Final   Staphylococcus species NOT DETECTED NOT DETECTED Final   Staphylococcus aureus (BCID) NOT DETECTED NOT DETECTED Final   Streptococcus species NOT DETECTED NOT DETECTED Final   Streptococcus agalactiae NOT DETECTED NOT DETECTED Final   Streptococcus pneumoniae NOT DETECTED NOT DETECTED Final   Streptococcus pyogenes NOT DETECTED NOT DETECTED Final   Acinetobacter baumannii NOT DETECTED NOT DETECTED Final   Enterobacteriaceae species DETECTED (A) NOT DETECTED Final    Comment: Enterobacteriaceae represent a large family of gram-negative bacteria, not  a single organism. CRITICAL RESULT CALLED TO, READ BACK BY AND VERIFIED WITH: M.BELL,PHARMD 2337 08/04/2018 M.CAMPBELL    Enterobacter cloacae complex NOT DETECTED NOT DETECTED Final   Escherichia coli DETECTED (A) NOT DETECTED Final    Comment: CRITICAL RESULT CALLED TO, READ BACK BY AND VERIFIED WITH: M.BELL,PHARMD 2337 08/04/2018 M.CAMPBELL    Klebsiella oxytoca NOT DETECTED NOT DETECTED Final   Klebsiella pneumoniae NOT DETECTED NOT DETECTED Final   Proteus species NOT DETECTED NOT DETECTED Final   Serratia marcescens NOT DETECTED NOT DETECTED Final   Carbapenem resistance NOT DETECTED NOT DETECTED Final   Haemophilus influenzae NOT DETECTED NOT DETECTED Final   Neisseria meningitidis NOT DETECTED NOT DETECTED Final   Pseudomonas aeruginosa NOT DETECTED NOT DETECTED Final   Candida  albicans NOT DETECTED NOT DETECTED Final   Candida glabrata NOT DETECTED NOT DETECTED Final   Candida krusei NOT DETECTED NOT DETECTED Final   Candida parapsilosis NOT DETECTED NOT DETECTED Final   Candida tropicalis NOT DETECTED NOT DETECTED Final    Comment: Performed at [facility], [place] [address] [place], [place] [address]  Culture, blood (Routine X 2) w Reflex to ID Panel     Status: Abnormal   Collection Time: 08/04/18 12:50 AM   Specimen: BLOOD  Result Value Ref Range Status   Specimen Description   Final    BLOOD LEFT ANTECUBITAL Performed at [facility], [address] [place], [place] [address]    Special Requests   Final    BOTTLES DRAWN AEROBIC AND ANAEROBIC Blood Culture adequate volume Performed at [facility], [address] [place], [place] [address]    Culture  Setup Time   Final    GRAM NEGATIVE RODS IN BOTH AEROBIC AND ANAEROBIC BOTTLES CRITICAL VALUE NOTED.  VALUE IS CONSISTENT WITH PREVIOUSLY REPORTED AND CALLED VALUE. Performed at [facility], [place] [address] [place], [place] [address]    Culture ESCHERICHIA COLI (A)  Final   Report Status 08/06/2018 FINAL  Final  SARS Coronavirus 2 (CEPHEID - Performed in [place] hospital lab), Hosp Order     Status: None   Collection Time: 08/04/18  6:25 AM   Specimen: Nasopharyngeal Swab  Result Value Ref Range Status   SARS Coronavirus 2 NEGATIVE NEGATIVE Final    Comment: (NOTE) If result is NEGATIVE SARS-CoV-2 target nucleic acids are NOT DETECTED. The SARS-CoV-2 RNA is generally detectable in upper and lower  respiratory specimens during the acute phase of infection. The lowest  concentration of SARS-CoV-2 viral copies this assay can detect is 250  copies / mL. A negative result does not preclude SARS-CoV-2 infection  and should not be used as the sole basis for treatment or other  patient management decisions.  A negative result may occur with  improper specimen  collection / handling, submission of specimen other  than nasopharyngeal swab, presence of viral mutation(s) within the  areas targeted by this assay, and inadequate number of viral copies  (<250 copies / mL). A negative result must be combined with clinical  observations, patient history, and epidemiological information. If result is POSITIVE SARS-CoV-2 target nucleic acids are DETECTED. The SARS-CoV-2 RNA is generally detectable in upper and lower  respiratory specimens dur ing the acute phase of infection.  Positive  results are indicative of active infection with SARS-CoV-2.  Clinical  correlation with patient history and other diagnostic information is  necessary to determine patient infection status.  Positive results  do  not rule out bacterial infection or co-infection with other viruses. If result is PRESUMPTIVE POSTIVE SARS-CoV-2 nucleic acids MAY BE PRESENT.   A presumptive positive result was obtained on the submitted specimen  and confirmed on repeat testing.  While 2019 novel coronavirus  (SARS-CoV-2) nucleic acids may be present in the submitted sample  additional confirmatory testing may be necessary for epidemiological  and / or clinical management purposes  to differentiate between  SARS-CoV-2 and other Sarbecovirus currently known to infect humans.  If clinically indicated additional testing with an alternate test  methodology [phone]) is advised. The SARS-CoV-2 RNA is generally  detectable in upper and lower respiratory sp ecimens during the acute  phase of infection. The expected result is Negative. Fact Sheet for Patients:  StrictlyIdeas.no Fact Sheet for Healthcare Providers: BankingDealers.co.za This test is not yet approved or cleared by the [place] FDA and has been authorized for detection and/or diagnosis of SARS-CoV-2 by FDA under an Emergency Use Authorization (EUA).  This EUA will remain in effect  (meaning this test can be used) for the duration of the COVID-19 declaration under Section 564(b)(1) of the Act, 21 U.S.C. section 360bbb-3(b)(1), unless the authorization is terminated or revoked sooner. Performed at [facility], [place] [address], [place], [place] [address]   Surgical PCR screen     Status: None   Collection Time: 08/06/18  7:42 AM   Specimen: Nasal Mucosa; Nasal Swab  Result Value Ref Range Status   MRSA, PCR NEGATIVE NEGATIVE Final   Staphylococcus aureus NEGATIVE NEGATIVE Final    Comment: (NOTE) The Xpert SA Assay (FDA approved for NASAL specimens in patients 1 years of age and older), is one component of a comprehensive surveillance program. It is not intended to diagnose infection nor to guide or monitor treatment. Performed at [facility], [place] [address], [place], [place] [address]      Labs: Basic Metabolic Panel: Recent Labs  Lab 08/05/18 0353 08/06/18 0734 08/07/18 0537 08/08/18 0029 08/09/18 0427  NA 139 138 136 135 135  K 3.5 3.9 4.3 5.1 4.3  CL 108 107 105 106 109  CO2 22 21* 20* 17* 21*  GLUCOSE 116* 92 99 156* 146*  BUN 19 13 11 19  25*  CREATININE 1.30* 1.17 1.11 1.09 1.05  CALCIUM 8.4* 9.0 9.1 9.1 9.1  MG  --  1.6* 1.7 1.8  --   PHOS  --  2.7 3.6 3.4  --    Liver Function Tests: Recent Labs  Lab 08/04/18 0033 08/05/18 0353 08/06/18 0734 08/07/18 0537 08/08/18 0029  AST 244* 81* 31 56* 47*  ALT 132* 102* 64* 65* 58*  ALKPHOS 249* 162* 136* 129* 117  BILITOT 2.8* 3.0* 1.5* 1.3* 1.4*  PROT 8.3* 6.2* 6.4* 6.6 6.9  ALBUMIN 3.6 2.7* 2.6* 2.6* 2.6*   Recent Labs  Lab 08/04/18 0033 08/07/18 0537  LIPASE 31 17   No results for input(s): AMMONIA in the last 168 hours. CBC: Recent Labs  Lab 08/04/18 0033  08/05/18 0353 08/06/18 0734 08/07/18 0537 08/08/18 0029 08/09/18 0427  WBC 8.4  --  10.1 7.4 10.1 11.6* 9.0  NEUTROABS 7.1  --   --  5.6 8.8* 10.6*  --   HGB 12.7*   < > 10.8*  11.0* 11.6* 11.8* 10.4*  HCT 38.9*   < > 33.1* 33.9* 36.4* 36.5* 31.6*  MCV 102.9*  --  104.1* 102.7* 105.2* 103.7* 103.6*  PLT 178  --  138*  146* 158 175 182   < > = values in this interval not displayed.   Cardiac Enzymes: No results for input(s): CKTOTAL, CKMB, CKMBINDEX, TROPONINI in the last 168 hours. BNP: BNP (last 3 results) No results for input(s): BNP in the last 8760 hours.  ProBNP (last 3 results) No results for input(s): PROBNP in the last 8760 hours.  CBG: Recent Labs  Lab 08/06/18 1456 08/08/18 0803 08/09/18 0853  GLUCAP 114* 143* 119*       Signed:  Desiree Hane, MD [facility] 08/09/2018, 1:50 PM

## 2018-08-10 ENCOUNTER — Encounter (HOSPITAL_COMMUNITY): Payer: Self-pay | Admitting: Gastroenterology

## 2018-08-10 ENCOUNTER — Other Ambulatory Visit: Payer: Self-pay

## 2018-08-10 NOTE — Patient Outreach (Signed)
  [facility]) Care Management Chronic Special Needs Program  08/10/2018  Name: Derek Curry DOB: 06-Mar-1940  MRN: [id number]  Derek Curry is enrolled in a chronic special needs plan for Diabetes. Reviewed and updated care plan. Client admitted to [facility] on 08/04/18 with dx for abd pain,  Acute cholecystitis, cholecystectomy.  Discharged 08/09/18 home  Goals Addressed            This Visit's Progress   . General - Client will not be readmitted within 30 days (C-SNP)       Please follow discharge instructions and call provider if you have any questions. Please attend all follow up appointments as scheduled. Please take your medications as prescribed. Please call 24 Hour nurse advice line as needed [phone]).       Transition of care call to be completed by [facility] utilization management team and RNCM will follow up per their recommendations. Plan:  Send outreach letter with a copy of their individualized care plan, Send individual care plan to provider   Send educational material-cholecystomy  Chronic care management coordinator will outreach in:  One month or sooner if needed    Peter Garter RN, [employer], [place] Management Coordinator [place] Management [phone]

## 2018-08-11 ENCOUNTER — Other Ambulatory Visit: Payer: Self-pay

## 2018-08-11 ENCOUNTER — Emergency Department (HOSPITAL_BASED_OUTPATIENT_CLINIC_OR_DEPARTMENT_OTHER): Payer: HMO

## 2018-08-11 ENCOUNTER — Encounter (HOSPITAL_BASED_OUTPATIENT_CLINIC_OR_DEPARTMENT_OTHER): Payer: Self-pay | Admitting: Emergency Medicine

## 2018-08-11 ENCOUNTER — Inpatient Hospital Stay (HOSPITAL_BASED_OUTPATIENT_CLINIC_OR_DEPARTMENT_OTHER)
Admission: EM | Admit: 2018-08-11 | Discharge: 2018-08-15 | DRG: 194 | Disposition: A | Discharge status: Home / Self Care | Payer: HMO | Attending: Family Medicine | Admitting: Family Medicine

## 2018-08-11 DIAGNOSIS — D539 Nutritional anemia, unspecified: Secondary | ICD-10-CM | POA: Diagnosis not present

## 2018-08-11 DIAGNOSIS — R0602 Shortness of breath: Secondary | ICD-10-CM | POA: Diagnosis not present

## 2018-08-11 DIAGNOSIS — Z79891 Long term (current) use of opiate analgesic: Secondary | ICD-10-CM

## 2018-08-11 DIAGNOSIS — Z950 Presence of cardiac pacemaker: Secondary | ICD-10-CM | POA: Diagnosis not present

## 2018-08-11 DIAGNOSIS — K8064 Calculus of gallbladder and bile duct with chronic cholecystitis without obstruction: Secondary | ICD-10-CM | POA: Diagnosis present

## 2018-08-11 DIAGNOSIS — G4733 Obstructive sleep apnea (adult) (pediatric): Secondary | ICD-10-CM | POA: Diagnosis not present

## 2018-08-11 DIAGNOSIS — Z7901 Long term (current) use of anticoagulants: Secondary | ICD-10-CM | POA: Diagnosis not present

## 2018-08-11 DIAGNOSIS — I10 Essential (primary) hypertension: Secondary | ICD-10-CM | POA: Diagnosis not present

## 2018-08-11 DIAGNOSIS — I480 Paroxysmal atrial fibrillation: Secondary | ICD-10-CM | POA: Diagnosis present

## 2018-08-11 DIAGNOSIS — Z20828 Contact with and (suspected) exposure to other viral communicable diseases: Secondary | ICD-10-CM | POA: Diagnosis present

## 2018-08-11 DIAGNOSIS — E785 Hyperlipidemia, unspecified: Secondary | ICD-10-CM | POA: Diagnosis not present

## 2018-08-11 DIAGNOSIS — K219 Gastro-esophageal reflux disease without esophagitis: Secondary | ICD-10-CM | POA: Diagnosis not present

## 2018-08-11 DIAGNOSIS — E119 Type 2 diabetes mellitus without complications: Secondary | ICD-10-CM

## 2018-08-11 DIAGNOSIS — Z79899 Other long term (current) drug therapy: Secondary | ICD-10-CM

## 2018-08-11 DIAGNOSIS — J189 Pneumonia, unspecified organism: Secondary | ICD-10-CM | POA: Diagnosis not present

## 2018-08-11 DIAGNOSIS — M109 Gout, unspecified: Secondary | ICD-10-CM | POA: Diagnosis present

## 2018-08-11 DIAGNOSIS — J90 Pleural effusion, not elsewhere classified: Secondary | ICD-10-CM | POA: Diagnosis not present

## 2018-08-11 DIAGNOSIS — Z9049 Acquired absence of other specified parts of digestive tract: Secondary | ICD-10-CM

## 2018-08-11 DIAGNOSIS — Y95 Nosocomial condition: Secondary | ICD-10-CM | POA: Diagnosis present

## 2018-08-11 DIAGNOSIS — N179 Acute kidney failure, unspecified: Secondary | ICD-10-CM | POA: Diagnosis present

## 2018-08-11 LAB — CBC WITH DIFFERENTIAL/PLATELET
Abs Immature Granulocytes: 0.09 10*3/uL — ABNORMAL HIGH (ref 0.00–0.07)
Basophils Absolute: 0 10*3/uL (ref 0.0–0.1)
Basophils Relative: 0 %
Eosinophils Absolute: 0.1 10*3/uL (ref 0.0–0.5)
Eosinophils Relative: 1 %
HCT: 33.2 % — ABNORMAL LOW (ref 39.0–52.0)
Hemoglobin: 10.9 g/dL — ABNORMAL LOW (ref 13.0–17.0)
Immature Granulocytes: 1 %
Lymphocytes Relative: 21 %
Lymphs Abs: 1.8 10*3/uL (ref 0.7–4.0)
MCH: 33.7 pg (ref 26.0–34.0)
MCHC: 32.8 g/dL (ref 30.0–36.0)
MCV: 102.8 fL — ABNORMAL HIGH (ref 80.0–100.0)
Monocytes Absolute: 0.9 10*3/uL (ref 0.1–1.0)
Monocytes Relative: 10 %
Neutro Abs: 5.7 10*3/uL (ref 1.7–7.7)
Neutrophils Relative %: 67 %
Platelets: 228 10*3/uL (ref 150–400)
RBC: 3.23 MIL/uL — ABNORMAL LOW (ref 4.22–5.81)
RDW: 14.1 % (ref 11.5–15.5)
WBC: 8.6 10*3/uL (ref 4.0–10.5)
nRBC: 0 % (ref 0.0–0.2)

## 2018-08-11 LAB — COMPREHENSIVE METABOLIC PANEL
ALT: 30 U/L (ref 0–44)
AST: 14 U/L — ABNORMAL LOW (ref 15–41)
Albumin: 3 g/dL — ABNORMAL LOW (ref 3.5–5.0)
Alkaline Phosphatase: 109 U/L (ref 38–126)
Anion gap: 12 (ref 5–15)
BUN: 17 mg/dL (ref 8–23)
CO2: 22 mmol/L (ref 22–32)
Calcium: 9.2 mg/dL (ref 8.9–10.3)
Chloride: 102 mmol/L (ref 98–111)
Creatinine, Ser: 1.04 mg/dL (ref 0.61–1.24)
GFR calc Af Amer: >60 mL/min (ref >60)
GFR calc non Af Amer: >60 mL/min (ref >60)
Glucose, Bld: 123 mg/dL — ABNORMAL HIGH (ref 70–99)
Potassium: 3.8 mmol/L (ref 3.5–5.1)
Sodium: 136 mmol/L (ref 135–145)
Total Bilirubin: 1.1 mg/dL (ref 0.3–1.2)
Total Protein: 6.8 g/dL (ref 6.5–8.1)

## 2018-08-11 LAB — BRAIN NATRIURETIC PEPTIDE: B Natriuretic Peptide: 118.7 pg/mL — ABNORMAL HIGH (ref 0.0–100.0)

## 2018-08-11 LAB — URINALYSIS, ROUTINE W REFLEX MICROSCOPIC
Bilirubin Urine: NEGATIVE
Glucose, UA: NEGATIVE mg/dL
Hgb urine dipstick: NEGATIVE
Ketones, ur: NEGATIVE mg/dL
Leukocytes,Ua: NEGATIVE
Nitrite: NEGATIVE
Protein, ur: NEGATIVE mg/dL
Specific Gravity, Urine: 1.02 (ref 1.005–1.030)
pH: 6 (ref 5.0–8.0)

## 2018-08-11 LAB — HEMOGLOBIN A1C
Hgb A1c MFr Bld: 6.9 % — ABNORMAL HIGH (ref 4.8–5.6)
Mean Plasma Glucose: 151.33 mg/dL

## 2018-08-11 LAB — SARS CORONAVIRUS 2 AG (30 MIN TAT): SARS Coronavirus 2 Ag: NEGATIVE

## 2018-08-11 LAB — LIPASE, BLOOD: Lipase: 21 U/L (ref 11–51)

## 2018-08-11 LAB — TROPONIN I (HIGH SENSITIVITY)
Troponin I (High Sensitivity): 9 ng/L (ref <18)
Troponin I (High Sensitivity): 10 ng/L (ref <18)

## 2018-08-11 LAB — LACTIC ACID, PLASMA: Lactic Acid, Venous: 0.9 mmol/L (ref 0.5–1.9)

## 2018-08-11 LAB — GLUCOSE, CAPILLARY
Glucose-Capillary: 101 mg/dL — ABNORMAL HIGH (ref 70–99)
Glucose-Capillary: 151 mg/dL — ABNORMAL HIGH (ref 70–99)

## 2018-08-11 MED ORDER — HYDROCHLOROTHIAZIDE 25 MG PO TABS: 25.0000 mg | ORAL_TABLET | Freq: Every day | ORAL | Status: DC

## 2018-08-11 MED ORDER — LISINOPRIL-HYDROCHLOROTHIAZIDE 20-25 MG PO TABS: 1.0000 | ORAL_TABLET | Freq: Every day | ORAL | Status: DC

## 2018-08-11 MED ORDER — CEFEPIME HCL 2 G IJ SOLR
INTRAMUSCULAR | Status: AC
  Filled 2018-08-11: qty 2

## 2018-08-11 MED ORDER — MAGNESIUM OXIDE 250 MG PO TABS: 250.0000 mg | ORAL_TABLET | Freq: Every day | ORAL | Status: DC

## 2018-08-11 MED ORDER — INSULIN ASPART 100 UNIT/ML ~~LOC~~ SOLN: 0.0000 [IU] | Freq: Every day | SUBCUTANEOUS | Status: DC

## 2018-08-11 MED ORDER — DILTIAZEM HCL ER COATED BEADS 240 MG PO CP24
240.0000 mg | ORAL_CAPSULE | Freq: Every day | ORAL | Status: DC
  Administered 2018-08-11 – 2018-08-15 (×5): 240 mg via ORAL
  Filled 2018-08-11 (×5): qty 1

## 2018-08-11 MED ORDER — LISINOPRIL 20 MG PO TABS: 20.0000 mg | ORAL_TABLET | Freq: Every day | ORAL | Status: DC

## 2018-08-11 MED ORDER — PIPERACILLIN-TAZOBACTAM 3.375 G IVPB
3.3750 g | Freq: Three times a day (TID) | INTRAVENOUS | Status: DC
  Administered 2018-08-11 – 2018-08-15 (×12): 3.375 g via INTRAVENOUS
  Filled 2018-08-11 (×12): qty 50

## 2018-08-11 MED ORDER — SODIUM CHLORIDE 0.9 % IV SOLN: 1.0000 g | Freq: Once | INTRAVENOUS | Status: DC

## 2018-08-11 MED ORDER — PIPERACILLIN-TAZOBACTAM 3.375 G IVPB 30 MIN: 3.3750 g | Freq: Three times a day (TID) | INTRAVENOUS | Status: DC

## 2018-08-11 MED ORDER — MECLIZINE HCL 25 MG PO TABS
12.5000 mg | ORAL_TABLET | Freq: Three times a day (TID) | ORAL | Status: DC | PRN
  Administered 2018-08-14: 21:00:00 12.5 mg via ORAL
  Filled 2018-08-11: qty 1

## 2018-08-11 MED ORDER — SODIUM CHLORIDE 0.9 % IV SOLN
INTRAVENOUS | Status: DC
  Administered 2018-08-11 – 2018-08-12 (×2): via INTRAVENOUS

## 2018-08-11 MED ORDER — ZOLPIDEM TARTRATE 5 MG PO TABS
5.0000 mg | ORAL_TABLET | Freq: Every evening | ORAL | Status: DC | PRN
  Administered 2018-08-12 – 2018-08-13 (×2): 5 mg via ORAL
  Filled 2018-08-11 (×2): qty 1

## 2018-08-11 MED ORDER — IOHEXOL 350 MG/ML SOLN
100.0000 mL | Freq: Once | INTRAVENOUS | Status: AC | PRN
  Administered 2018-08-11: 09:00:00 100 mL via INTRAVENOUS

## 2018-08-11 MED ORDER — VANCOMYCIN HCL IN DEXTROSE 1-5 GM/200ML-% IV SOLN
1000.0000 mg | Freq: Once | INTRAVENOUS | Status: AC
  Administered 2018-08-11: 11:00:00 1000 mg via INTRAVENOUS
  Filled 2018-08-11: qty 200

## 2018-08-11 MED ORDER — PANTOPRAZOLE SODIUM 40 MG PO TBEC
40.0000 mg | DELAYED_RELEASE_TABLET | Freq: Every day | ORAL | Status: DC
  Administered 2018-08-12 – 2018-08-15 (×4): 40 mg via ORAL
  Filled 2018-08-11 (×4): qty 1

## 2018-08-11 MED ORDER — ALLOPURINOL 300 MG PO TABS
300.0000 mg | ORAL_TABLET | Freq: Every day | ORAL | Status: DC
  Administered 2018-08-12 – 2018-08-15 (×4): 300 mg via ORAL
  Filled 2018-08-11 (×4): qty 1

## 2018-08-11 MED ORDER — SIMVASTATIN 10 MG PO TABS
10.0000 mg | ORAL_TABLET | Freq: Every day | ORAL | Status: DC
  Administered 2018-08-12 – 2018-08-15 (×4): 10 mg via ORAL
  Filled 2018-08-11 (×4): qty 1

## 2018-08-11 MED ORDER — ALUM & MAG HYDROXIDE-SIMETH 200-200-20 MG/5ML PO SUSP
30.0000 mL | Freq: Four times a day (QID) | ORAL | Status: DC | PRN
  Administered 2018-08-13: 21:00:00 30 mL via ORAL
  Filled 2018-08-11: qty 30

## 2018-08-11 MED ORDER — APIXABAN 5 MG PO TABS
5.0000 mg | ORAL_TABLET | Freq: Two times a day (BID) | ORAL | Status: DC
  Administered 2018-08-11 – 2018-08-15 (×8): 5 mg via ORAL
  Filled 2018-08-11 (×8): qty 1

## 2018-08-11 MED ORDER — INSULIN ASPART 100 UNIT/ML ~~LOC~~ SOLN
0.0000 [IU] | Freq: Three times a day (TID) | SUBCUTANEOUS | Status: DC
  Administered 2018-08-13 (×2): 2 [IU] via SUBCUTANEOUS
  Administered 2018-08-14: 09:00:00 3 [IU] via SUBCUTANEOUS

## 2018-08-11 MED ORDER — CEFEPIME HCL 1 G IJ SOLR
INTRAMUSCULAR | Status: AC
  Filled 2018-08-11: qty 1

## 2018-08-11 MED ORDER — SODIUM CHLORIDE 0.9 % IV SOLN
2.0000 g | Freq: Once | INTRAVENOUS | Status: AC
  Administered 2018-08-11: 10:00:00 2 g via INTRAVENOUS
  Filled 2018-08-11: qty 2

## 2018-08-11 MED ORDER — FUROSEMIDE 10 MG/ML IJ SOLN
20.0000 mg | INTRAMUSCULAR | Status: AC
  Administered 2018-08-11: 09:00:00 20 mg via INTRAVENOUS
  Filled 2018-08-11: qty 2

## 2018-08-11 MED ORDER — SODIUM CHLORIDE 0.9 % IV SOLN
INTRAVENOUS | Status: DC | PRN
  Administered 2018-08-11: 250 mL via INTRAVENOUS

## 2018-08-11 MED ORDER — VANCOMYCIN HCL IN DEXTROSE 750-5 MG/150ML-% IV SOLN
750.0000 mg | Freq: Two times a day (BID) | INTRAVENOUS | Status: DC
  Administered 2018-08-11 – 2018-08-12 (×2): 750 mg via INTRAVENOUS
  Filled 2018-08-11 (×3): qty 150

## 2018-08-11 MED ORDER — MAGNESIUM OXIDE 400 (241.3 MG) MG PO TABS
200.0000 mg | ORAL_TABLET | Freq: Every day | ORAL | Status: DC
  Administered 2018-08-12 – 2018-08-15 (×4): 200 mg via ORAL
  Filled 2018-08-11 (×4): qty 1

## 2018-08-11 NOTE — ED Provider Notes (Signed)
[facility] EMERGENCY DEPARTMENT Provider Note   CSN: [id number] Arrival date & time: 08/11/18  2376    History   Chief Complaint Chief Complaint  Patient presents with  . Shortness of Breath    HPI Derek Curry is a 78 y.o. male.     78 year old male with past medical history including pacemaker, type 2 diabetes mellitus, hypertension, hyperlipidemia, GERD, OSA, recent cholecystectomy who presents with shortness of breath.  The patient was recently admitted to the hospital and discharged 2 days ago for choledocholithiasis, probable early cholangitis, and E. coli bacteremia.  He had cholecystectomy and was discharged home on Augmentin which she has been taking.  He notes that this morning he woke up feeling short of breath and he also felt hot.  He has had no measured fevers at home.  He was not having any shortness of breath yesterday.  Shortness of breath is mildly worse with exertion.  No associated chest pain or cough.  He has had some mild leg swelling.  No appreciable orthopnea.  He has had some mild right flank pain and bloating since the surgery which is about the same as usual.  His pain gets worse when he takes a deep breath.  No urinary symptoms.  He has had mild diarrhea since the surgery.  The history is provided by the patient.  Shortness of Breath   Past Medical History:  Diagnosis Date  . Diabetes mellitus   . GERD (gastroesophageal reflux disease)   . Gout   . Hypertension   . Prostatitis     Patient Active Problem List   Diagnosis Date Noted  . HCAP (healthcare-associated pneumonia) 08/11/2018  . Gallstones 08/05/2018  . Acute abdominal pain 08/04/2018  . AKI (acute kidney injury) ([place]) 08/04/2018  . Elevated liver enzymes 08/04/2018  . Chronic gout of left ankle 01/04/2016  . Chronic seasonal allergic rhinitis 01/04/2016  . Morbid obesity ([place]) 07/04/2015  . Pacemaker reprogramming/check 05/04/2015  . SSS (sick sinus syndrome) ([place])  05/04/2015  . S/P ablation of atrial fibrillation 04/22/2015  . Type 2 diabetes mellitus ([place]) 04/02/2015  . Paroxysmal A-fib ([place]) 03/10/2015  . Essential hypertension 03/10/2015  . Gastroesophageal reflux disease without esophagitis 03/10/2015  . Status post total left knee replacement 05/14/2014  . Hyperlipidemia 03/12/2014  . OSA (obstructive sleep apnea) 03/12/2014    Past Surgical History:  Procedure Laterality Date  . CHOLECYSTECTOMY N/A 08/06/2018   Procedure: LAPAROSCOPIC CHOLECYSTECTOMY WITH INTRAOPERATIVE CHOLANGIOGRAM;  Surgeon: Jovita Kussmaul, MD;  Location: [facility];  Service: General;  Laterality: N/A;  . ERCP N/A 08/07/2018   Procedure: ENDOSCOPIC RETROGRADE CHOLANGIOPANCREATOGRAPHY (ERCP);  Surgeon: [name], MD;  Location: [facility];  Service: Endoscopy;  Laterality: N/A;  . PACEMAKER INSERTION    . REMOVAL OF STONES  08/07/2018   Procedure: REMOVAL OF STONES;  Surgeon: [name], MD;  Location: [facility] ENDOSCOPY;  Service: Endoscopy;;  . Joan Mayans  08/07/2018   Procedure: Joan Mayans;  Surgeon: [name], MD;  Location: [facility] ENDOSCOPY;  Service: Endoscopy;;        Home Medications    Prior to Admission medications   Medication Sig Start Date End Date Taking? Authorizing Provider  allopurinol (ZYLOPRIM) 300 MG tablet Take 300 mg by mouth daily.     [provider]  allopurinol (ZYLOPRIM) 300 MG tablet Take 300 mg by mouth daily.    [provider]  aluminum-magnesium hydroxide-simethicone (MAALOX) 200-200-20 MG/5ML SUSP Take 30 mLs by mouth 4 (four) times daily as  needed (heart burn).    [provider]  amoxicillin-clavulanate (AUGMENTIN) 875-125 MG tablet Take 1 tablet by mouth every 12 (twelve) hours for 7 days. 08/09/18 08/16/18  Desiree Hane, MD  apixaban (ELIQUIS) 5 MG TABS tablet Take 5 mg by mouth 2 (two) times daily.  06/03/16   [provider]  Blood Glucose Monitoring Suppl (ONE TOUCH ULTRA 2) w/Device KIT  USE TO CHECK BLOOD SUGAR DAILY 05/09/18   [provider]  diltiazem (CARDIZEM CD) 240 MG 24 hr capsule Take 240 mg by mouth daily.  05/24/16   [provider]  diltiazem (CARDIZEM CD) 240 MG 24 hr capsule Take 1 capsule by mouth daily. 04/16/18   [provider]  ELIQUIS 5 MG TABS tablet Take 1 tablet by mouth 2 (two) times daily. 05/11/18   Mahala Menghini, MD  fluticasone (FLONASE) 50 MCG/ACT nasal spray Place 2 sprays into both nostrils daily as needed. 04/13/18   [provider]  fluticasone (FLONASE) 50 MCG/ACT nasal spray Place 1 spray into both nostrils daily as needed for allergies or rhinitis.    [provider]  glucose blood (ACCU-CHEK GUIDE) test strip USE TO CHECK BLOOD SUGAR TWICE A DAY 03/21/17   [provider]  lisinopril-hydrochlorothiazide (PRINZIDE,ZESTORETIC) 20-25 MG per tablet Take 1 tablet by mouth daily.    [provider]  lisinopril-hydrochlorothiazide (PRINZIDE,ZESTORETIC) 20-25 MG tablet Take 1 tablet by mouth daily. 03/28/18   [provider]  Magnesium 250 MG TABS Take 1 tablet by mouth daily.    [provider]  Magnesium Oxide 250 MG TABS Take 250 mg by mouth daily.     [provider]  meclizine (ANTIVERT) 12.5 MG tablet Take 12.5 mg by mouth 3 (three) times daily as needed for dizziness or nausea.  03/12/15   [provider]  naproxen sodium (ALEVE) 220 MG tablet Take 440 mg by mouth 2 (two) times daily as needed (pain).    [provider]  omeprazole (PRILOSEC) 40 MG capsule Take 40 mg by mouth daily.  04/13/18   [provider]  omeprazole (PRILOSEC) 40 MG capsule Take 1 capsule by mouth daily. 04/13/18   [provider]  omeprazole (PRILOSEC) 40 MG capsule Take 40 mg by mouth daily.    [provider]  ONE TOUCH ULTRA TEST test strip USE TO CHECK BLOOD SUGAR DAILY 05/09/18   [provider]  oxyCODONE (ROXICODONE) 5 MG immediate release  tablet Take 1 tablet (5 mg total) by mouth every 6 (six) hours as needed for breakthrough pain. 08/08/18   Maczis, Barth Kirks, PA-C  simvastatin (ZOCOR) 10 MG tablet Take 1 tablet by mouth daily. 04/13/18   [provider]  triamcinolone cream (KENALOG) 0.1 % Apply to affected areas twice daily as needed 01/15/18   [provider]  valACYclovir (VALTREX) 500 MG tablet Take 500 mg by mouth 2 (two) times daily as needed (outbreak).  03/28/18   [provider]  valACYclovir (VALTREX) 500 MG tablet TK 1 T PO BID PRN FOR ONE WEEK (PRN) 03/28/18   [provider]  zolpidem (AMBIEN) 5 MG tablet Take 5 mg by mouth at bedtime as needed for sleep.     [provider]  zolpidem (AMBIEN) 5 MG tablet Take 1 tablet by mouth at bedtime. 01/15/18   [provider]    Family History History reviewed. No pertinent family history.  Social History Social History   Tobacco Use  . Smoking status: Never  Smoker  . Smokeless tobacco: Never Used  Substance Use Topics  . Alcohol use: No  . Drug use: No     Allergies   Benazepril, Shellfish allergy, Shellfish allergy, Vicodin [hydrocodone-acetaminophen], and Vicodin [hydrocodone-acetaminophen]   Review of Systems Review of Systems  Respiratory: Positive for shortness of breath.    All other systems reviewed and are negative except that which was mentioned in HPI   Physical Exam Updated Vital Signs BP 116/67   Pulse (!) 59   Temp 97.6 F (36.4 C) (Oral)   Resp 19   Ht _0  (1.727 m)   Wt 78.5 kg   SpO2 98%   BMI 26.31 kg/m   Physical Exam Vitals signs and nursing note reviewed.  Constitutional:      General: He is not in acute distress.    Appearance: He is well-developed.  HENT:     Head: Normocephalic and atraumatic.  Eyes:     Conjunctiva/sclera: Conjunctivae normal.  Neck:     Musculoskeletal: Neck supple.  Cardiovascular:     Rate and Rhythm: Normal rate and regular rhythm.      Heart sounds: Normal heart sounds. No murmur.  Pulmonary:     Effort: Pulmonary effort is normal.     Breath sounds: Normal breath sounds. No wheezing or rales.  Abdominal:     General: Bowel sounds are normal. There is distension (mild).     Palpations: Abdomen is soft.     Tenderness: There is no abdominal tenderness.     Comments: 5 healing incision sites on abdomen w/ glue in place, clean/dry/intact  Musculoskeletal:     Right lower leg: Edema present.     Left lower leg: Edema present.     Comments: 1+ pitting edema BLE  Skin:    General: Skin is warm and dry.  Neurological:     Mental Status: He is alert and oriented to person, place, and time.     Comments: Fluent speech  Psychiatric:        Mood and Affect: Mood normal.        Judgment: Judgment normal.      ED Treatments / Results  Labs (all labs ordered are listed, but only abnormal results are displayed) Labs Reviewed  COMPREHENSIVE METABOLIC PANEL - Abnormal; Notable for the following components:      Result Value   Glucose, Bld 123 (*)    Albumin 3.0 (*)    AST 14 (*)    All other components within normal limits  CBC WITH DIFFERENTIAL/PLATELET - Abnormal; Notable for the following components:   RBC 3.23 (*)    Hemoglobin 10.9 (*)    HCT 33.2 (*)    MCV 102.8 (*)    Abs Immature Granulocytes 0.09 (*)    All other components within normal limits  BRAIN NATRIURETIC PEPTIDE - Abnormal; Notable for the following components:   B Natriuretic Peptide 118.7 (*)    All other components within normal limits  SARS CORONAVIRUS 2 (HOSP ORDER, PERFORMED IN Agency LAB VIA ABBOTT ID)  LIPASE, BLOOD  TROPONIN I (HIGH SENSITIVITY)  TROPONIN I (HIGH SENSITIVITY)  LACTIC ACID, PLASMA  URINALYSIS, ROUTINE W REFLEX MICROSCOPIC    EKG EKG Interpretation  Date/Time:  Saturday August 11 2018 07:32:19 EDT Ventricular Rate:  68 PR Interval:    QRS Duration: 129 QT Interval:  409 QTC Calculation: 435 R Axis:   -66  Text Interpretation:  Sinus rhythm Atrial premature complex Short PR interval Left  bundle branch block similar to previous Confirmed by Theotis Burrow [phone]) on 08/11/2018 7:37:38 AM   Radiology Dg Chest 2 View  Result Date: 08/11/2018 CLINICAL DATA:  Recent cholecystectomy. EXAM: CHEST - 2 VIEW COMPARISON:  08/04/2018 FINDINGS: Mild hazy density at the bases with small pleural effusions. Borderline heart size, stable. Dual-chamber pacer leads from the left in unremarkable position. IMPRESSION: Small pleural effusions with mild atelectasis or infection at the bases. Electronically Signed   By: Monte Fantasia M.D.   On: 08/11/2018 07:46   Ct Angio Chest Pe W/cm &/or Wo Cm  Result Date: 08/11/2018 CLINICAL DATA:  Shortness of breath. Status post recent cholecystectomy EXAM: CT ANGIOGRAPHY CHEST WITH CONTRAST TECHNIQUE: Multidetector CT imaging of the chest was performed using the standard protocol during bolus administration of intravenous contrast. Multiplanar CT image reconstructions and MIPs were obtained to evaluate the vascular anatomy. CONTRAST:  175m OMNIPAQUE IOHEXOL 350 MG/ML SOLN COMPARISON:  Chest CT April 21, 2015; chest radiograph August 06, 2015 FINDINGS: Cardiovascular: There is no demonstrable pulmonary embolus. There is no thoracic aortic aneurysm or dissection. Visualized great vessels appear unremarkable. Note that the right innominate and left common carotid arteries arise as a common trunk, an anatomic variant. There is no evident pericardial effusion or pericardial thickening. Pacemaker leads are attached to the right atrium and right ventricle. Mediastinum/Nodes: Thyroid is rather diminutive. Thyroid appears unremarkable. There is no evident thoracic adenopathy. No esophageal lesions are appreciable. Lungs/Pleura: There are small pleural effusions bilaterally with compressive atelectasis in the lung base regions. There may be mild airspace consolidation associated with atelectasis in  the posterior right base. Lungs elsewhere are clear. Upper Abdomen: Gallbladder is absent. There is a focal area of fluid and air in the gallbladder fossa region, incompletely visualized which measures 3.8 x 2.4 cm. This focus of fluid and air may well be due to the recent cholecystectomy. It should be noted that from an imaging standpoint, a developing abscess in this area cannot be excluded. Visualized upper abdominal structures otherwise appear unremarkable. 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 There are no blastic or lytic bone lesions. No chest wall lesions are evident. Review of the MIP images confirms the above findings. IMPRESSION: 1. No demonstrable pulmonary embolus. No thoracic aortic aneurysm or dissection. 2. Incomplete visualization of fluid and air collection within the gallbladder fossa region. This fluid and air may well be secondary to recent cholecystectomy. From an imaging standpoint, developing abscess in this area cannot be excluded. Close clinical surveillance in this regard advised. Repeat imaging of this area may be warranted if patient show symptoms suggesting infection in this area. 3. Small pleural effusions bilaterally with compressive atelectasis in the lung base regions. Early pneumonia superimposed with atelectasis in the right base cannot be entirely excluded. 4.  No demonstrable thoracic adenopathy. 5. Degenerative change in the thoracic spine with a degree of diffuse idiopathic skeletal hyperostosis, also present previously. 6.  Pacemaker leads attached to right atrium and right ventricle. Electronically Signed   By: WLowella GripIII M.D.   On: 08/11/2018 09:06    Procedures Procedures (including critical care time)  Medications Ordered in ED Medications  vancomycin (VANCOCIN) IVPB 1000 mg/200 mL premix (1,000 mg Intravenous New Bag/Given 08/11/18 1109)  vancomycin (VANCOCIN) IVPB 750 mg/150 ml premix (has no  administration in time range)  0.9 %  sodium chloride infusion (250 mLs Intravenous New Bag/Given 08/11/18 1012)  ceFEPIme (MAXIPIME) 2 g injection (has no administration in  time range)  furosemide (LASIX) injection 20 mg (20 mg Intravenous Given 08/11/18 0832)  iohexol (OMNIPAQUE) 350 MG/ML injection 100 mL (100 mLs Intravenous Contrast Given 08/11/18 0845)  ceFEPIme (MAXIPIME) 2 g in sodium chloride 0.9 % 100 mL IVPB ( Intravenous Stopped 08/11/18 1045)     Initial Impression / Assessment and Plan / ED Course  I have reviewed the triage vital signs and the nursing notes.  Pertinent labs & imaging results that were available during my care of the patient were reviewed by me and considered in my medical decision making (see chart for details).        Well-appearing on exam, normal vital signs, no hypoxia.  I did not appreciate any wheezing or rales.  Differential includes post-hospitalization pneumonia or volume overload, post-op PE. Doubt COVID-19 as he tested negative recently and has had no sx suggestive of virus. COVID test here is negative.  Labs show normal trop, normal CMP, normal WBC count. BNP 118.  X-ray shows small pleural effusions. Gave small dose of lasix. Obtain CT of chest which was negative for PE, shows b/l pleural effusions and possible pneumonia RLL.  When his recent hospitalization, treated for H CAP with vancomycin and cefepime. Pt accepted by Dr. Doristine Bosworth, [facility], for admission at [facility].  Derek Curry was evaluated in Emergency Department on 08/11/2018 for the symptoms described in the history of present illness. He was evaluated in the context of the global COVID-19 pandemic, which necessitated consideration that the patient might be at risk for infection with the SARS-CoV-2 virus that causes COVID-19. Institutional protocols and algorithms that pertain to the evaluation of patients at risk for COVID-19 are in a state of rapid change based on information released by  regulatory bodies including the CDC and federal and state organizations. These policies and algorithms were followed during the patient's care in the ED.   Final Clinical Impressions(s) / ED Diagnoses   Final diagnoses:  HCAP (healthcare-associated pneumonia)  Shortness of breath    ED Discharge Orders    None       Waylan Busta, Wenda Overland, MD 08/11/18 1158

## 2018-08-11 NOTE — Plan of Care (Signed)
Patient received via [facility] from [facility]; ambulated in room to bathroom and back to bed. No complaints of pain at this time. Awaiting orders. Will continue to monitor.

## 2018-08-11 NOTE — H&P (Signed)
History and Physical    Derek Curry EXB:[id number] DOB: [birth year] DOA: 08/11/2018  PCP: Jefm Petty, MD  Patient coming from: Home  I have personally briefly reviewed patient's old medical records in [place]  Chief Complaint: Shortness of breath  HPI: Derek Curry is a 78 y.o. male with medical history significant of type 2 diabetes mellitus, GERD, hypertension, paroxysmal atrial fibrillation and recent hospitalization for cholelithiasis and elevated liver enzymes status post laparoscopic cholecystectomy on 08/06/2018 presented to ER with a complaint of shortness of breath.  Patient was discharged from the hospital on 08/09/2018 after staying here for 5 days where he was diagnosed with acute cholangitis, acute cholelithiasis status post cholecystectomy and gram-negative bacteremia who received IV antibiotics here and was discharged on oral Augmentin.  According to patient, he was doing well up until this morning when he woke up with shortness of breath and felt hot as well as sweaty.  He did not check his temperature.  He also had some nonproductive cough.  No chills, dizziness, any other complaint.  He has been home since discharge so there is no sick contact or any recent travel.  He also had associated right upper quadrant pain at that moment which is resolved now.  ED Course: Patient went to med center [facility].  Upon initial presentation, he was hemodynamically stable and then he dropped his blood pressure little bit with the lowest being 97/53.  He received some IV fluids.  CBC and CMP were within normal limits.  CT angiogram of the chest was done which ruled out PE however it showed bilateral small pleural effusion with atelectasis and possible consolidation.  Patient was diagnosed with healthcare associated pneumonia and he was given a dose of cefepime and vancomycin and transferred to [facility] for admission.  Patient was tested negative for COVID-19.  He was  not hypoxic.  CT also showed right upper quadrant abdominal postcholecystectomy changes/fluid.  Review of Systems: As per HPI otherwise 10 point review of systems negative.    Past Medical History:  Diagnosis Date   Diabetes mellitus    GERD (gastroesophageal reflux disease)    Gout    Hypertension    Prostatitis     Past Surgical History:  Procedure Laterality Date   CHOLECYSTECTOMY N/A 08/06/2018   Procedure: LAPAROSCOPIC CHOLECYSTECTOMY WITH INTRAOPERATIVE CHOLANGIOGRAM;  Surgeon: Jovita Kussmaul, MD;  Location: [facility] ORS;  Service: General;  Laterality: N/A;   ERCP N/A 08/07/2018   Procedure: ENDOSCOPIC RETROGRADE CHOLANGIOPANCREATOGRAPHY (ERCP);  Surgeon: [name], MD;  Location: [facility];  Service: Endoscopy;  Laterality: N/A;   PACEMAKER INSERTION     REMOVAL OF STONES  08/07/2018   Procedure: REMOVAL OF STONES;  Surgeon: [name], MD;  Location: [facility];  Service: Endoscopy;;   SPHINCTEROTOMY  08/07/2018   Procedure: Joan Mayans;  Surgeon: [name], MD;  Location: [facility] ENDOSCOPY;  Service: Endoscopy;;     reports that he has never smoked. He has never used smokeless tobacco. He reports that he does not drink alcohol or use drugs.  Allergies  Allergen Reactions   Benazepril Anaphylaxis   Shellfish Allergy Swelling   Shellfish Allergy Anaphylaxis   Vicodin [Hydrocodone-Acetaminophen] Other (See Comments)    Dizzy   Vicodin [Hydrocodone-Acetaminophen]     Mental status changes     History reviewed. No pertinent family history.  Prior to Admission medications   Medication Sig Start Date End Date Taking? Authorizing Provider  allopurinol (ZYLOPRIM) 300 MG tablet Take  300 mg by mouth daily.     [provider]  allopurinol (ZYLOPRIM) 300 MG tablet Take 300 mg by mouth daily.    [provider]  aluminum-magnesium hydroxide-simethicone (MAALOX) 200-200-20 MG/5ML SUSP Take 30 mLs by mouth 4 (four) times daily as needed  (heart burn).    [provider]  amoxicillin-clavulanate (AUGMENTIN) 875-125 MG tablet Take 1 tablet by mouth every 12 (twelve) hours for 7 days. 08/09/18 08/16/18  Desiree Hane, MD  apixaban (ELIQUIS) 5 MG TABS tablet Take 5 mg by mouth 2 (two) times daily.  06/03/16   [provider]  Blood Glucose Monitoring Suppl (ONE TOUCH ULTRA 2) w/Device KIT USE TO CHECK BLOOD SUGAR DAILY 05/09/18   [provider]  diltiazem (CARDIZEM CD) 240 MG 24 hr capsule Take 240 mg by mouth daily.  05/24/16   [provider]  diltiazem (CARDIZEM CD) 240 MG 24 hr capsule Take 1 capsule by mouth daily. 04/16/18   [provider]  ELIQUIS 5 MG TABS tablet Take 1 tablet by mouth 2 (two) times daily. 05/11/18   Mahala Menghini, MD  fluticasone (FLONASE) 50 MCG/ACT nasal spray Place 2 sprays into both nostrils daily as needed. 04/13/18   [provider]  fluticasone (FLONASE) 50 MCG/ACT nasal spray Place 1 spray into both nostrils daily as needed for allergies or rhinitis.    [provider]  glucose blood (ACCU-CHEK GUIDE) test strip USE TO CHECK BLOOD SUGAR TWICE A DAY 03/21/17   [provider]  lisinopril-hydrochlorothiazide (PRINZIDE,ZESTORETIC) 20-25 MG per tablet Take 1 tablet by mouth daily.    [provider]  lisinopril-hydrochlorothiazide (PRINZIDE,ZESTORETIC) 20-25 MG tablet Take 1 tablet by mouth daily. 03/28/18   [provider]  Magnesium 250 MG TABS Take 1 tablet by mouth daily.    [provider]  Magnesium Oxide 250 MG TABS Take 250 mg by mouth daily.     [provider]  meclizine (ANTIVERT) 12.5 MG tablet Take 12.5 mg by mouth 3 (three) times daily as needed for dizziness or nausea.  03/12/15   [provider]  naproxen sodium (ALEVE) 220 MG tablet Take 440 mg by mouth 2 (two) times daily as needed (pain).    [provider]  omeprazole (PRILOSEC) 40 MG capsule Take 40 mg by mouth daily.   04/13/18   [provider]  omeprazole (PRILOSEC) 40 MG capsule Take 1 capsule by mouth daily. 04/13/18   [provider]  omeprazole (PRILOSEC) 40 MG capsule Take 40 mg by mouth daily.    [provider]  ONE TOUCH ULTRA TEST test strip USE TO CHECK BLOOD SUGAR DAILY 05/09/18   [provider]  oxyCODONE (ROXICODONE) 5 MG immediate release tablet Take 1 tablet (5 mg total) by mouth every 6 (six) hours as needed for breakthrough pain. 08/08/18   Maczis, Barth Kirks, PA-C  simvastatin (ZOCOR) 10 MG tablet Take 1 tablet by mouth daily. 04/13/18   [provider]  triamcinolone cream (KENALOG) 0.1 % Apply to affected areas twice daily as needed 01/15/18   [provider]  valACYclovir (VALTREX) 500 MG tablet Take 500 mg by mouth 2 (two) times daily as needed (outbreak).  03/28/18   [provider]  valACYclovir (VALTREX) 500 MG tablet TK 1 T PO BID PRN FOR ONE WEEK (PRN) 03/28/18   [provider]  zolpidem (AMBIEN) 5 MG tablet Take 5 mg by mouth at bedtime as needed for sleep.  [provider]  zolpidem (AMBIEN) 5 MG tablet Take 1 tablet by mouth at bedtime. 01/15/18   [provider]    Physical Exam: Vitals:   08/11/18 1330 08/11/18 1400 08/11/18 1430 08/11/18 1618  BP: 124/75 124/74 127/73 116/63  Pulse: (!) 55 (!) 51 (!) 59 (!) 54  Resp: 15 15 14 16   Temp:    98 F (36.7 C)  TempSrc:    Oral  SpO2: 98% 98% 99% 100%  Weight:      Height:        Constitutional: NAD, calm, comfortable Vitals:   08/11/18 1330 08/11/18 1400 08/11/18 1430 08/11/18 1618  BP: 124/75 124/74 127/73 116/63  Pulse: (!) 55 (!) 51 (!) 59 (!) 54  Resp: 15 15 14 16   Temp:    98 F (36.7 C)  TempSrc:    Oral  SpO2: 98% 98% 99% 100%  Weight:      Height:       Eyes: PERRL, lids and conjunctivae normal, morbidly obese ENMT: Mucous membranes are moist. Posterior pharynx clear of any exudate or lesions.Normal dentition.  Neck:  normal, supple, no masses, no thyromegaly Respiratory: clear to auscultation bilaterally, no wheezing, no crackles. Normal respiratory effort. No accessory muscle use.  Cardiovascular: Regular rate and rhythm, no murmurs / rubs / gallops. No extremity edema. 2+ pedal pulses. No carotid bruits.  Abdomen: Mild right upper quadrant tenderness, no masses palpated. No hepatosplenomegaly. Bowel sounds positive.  Musculoskeletal: no clubbing / cyanosis. No joint deformity upper and lower extremities. Good ROM, no contractures. Normal muscle tone.  Skin: no rashes, lesions, ulcers. No induration Neurologic: CN 2-12 grossly intact. Sensation intact, DTR normal. Strength 5/5 in all 4.  Psychiatric: Normal judgment and insight. Alert and oriented x 3. Normal mood.    Labs on Admission: I have personally reviewed following labs and imaging studies  CBC: Recent Labs  Lab 08/06/18 0734 08/07/18 0537 08/08/18 0029 08/09/18 0427 08/11/18 0734  WBC 7.4 10.1 11.6* 9.0 8.6  NEUTROABS 5.6 8.8* 10.6*  --  5.7  HGB 11.0* 11.6* 11.8* 10.4* 10.9*  HCT 33.9* 36.4* 36.5* 31.6* 33.2*  MCV 102.7* 105.2* 103.7* 103.6* 102.8*  PLT 146* 158 175 182 270   Basic Metabolic Panel: Recent Labs  Lab 08/06/18 0734 08/07/18 0537 08/08/18 0029 08/09/18 0427 08/11/18 0734  NA 138 136 135 135 136  K 3.9 4.3 5.1 4.3 3.8  CL 107 105 106 109 102  CO2 21* 20* 17* 21* 22  GLUCOSE 92 99 156* 146* 123*  BUN 13 11 19  25* 17  CREATININE 1.17 1.11 1.09 1.05 1.04  CALCIUM 9.0 9.1 9.1 9.1 9.2  MG 1.6* 1.7 1.8  --   --   PHOS 2.7 3.6 3.4  --   --    GFR: Estimated Creatinine Clearance: 57.5 mL/min (by C-G formula based on SCr of 1.04 mg/dL). Liver Function Tests: Recent Labs  Lab 08/05/18 0353 08/06/18 0734 08/07/18 0537 08/08/18 0029 08/11/18 0734  AST 81* 31 56* 47* 14*  ALT 102* 64* 65* 58* 30  ALKPHOS 162* 136* 129* 117 109  BILITOT 3.0* 1.5* 1.3* 1.4* 1.1  PROT 6.2* 6.4* 6.6 6.9 6.8  ALBUMIN 2.7* 2.6*  2.6* 2.6* 3.0*   Recent Labs  Lab 08/07/18 0537 08/11/18 0734  LIPASE 17 21   No results for input(s): AMMONIA in the last 168 hours. Coagulation Profile: No results for input(s): INR, PROTIME in the last 168 hours. Cardiac Enzymes: No results for input(s):  CKTOTAL, CKMB, CKMBINDEX, TROPONINI in the last 168 hours. BNP (last 3 results) No results for input(s): PROBNP in the last 8760 hours. HbA1C: No results for input(s): HGBA1C in the last 72 hours. CBG: Recent Labs  Lab 08/06/18 1456 08/08/18 0803 08/09/18 0853  GLUCAP 114* 143* 119*   Lipid Profile: No results for input(s): CHOL, HDL, LDLCALC, TRIG, CHOLHDL, LDLDIRECT in the last 72 hours. Thyroid Function Tests: No results for input(s): TSH, T4TOTAL, FREET4, T3FREE, THYROIDAB in the last 72 hours. Anemia Panel: No results for input(s): VITAMINB12, FOLATE, FERRITIN, TIBC, IRON, RETICCTPCT in the last 72 hours. Urine analysis:    Component Value Date/Time   COLORURINE YELLOW 08/11/2018 0730   APPEARANCEUR CLEAR 08/11/2018 0730   LABSPEC 1.020 08/11/2018 0730   PHURINE 6.0 08/11/2018 0730   GLUCOSEU NEGATIVE 08/11/2018 0730   HGBUR NEGATIVE 08/11/2018 0730   BILIRUBINUR NEGATIVE 08/11/2018 0730   KETONESUR NEGATIVE 08/11/2018 0730   PROTEINUR NEGATIVE 08/11/2018 0730   NITRITE NEGATIVE 08/11/2018 0730   LEUKOCYTESUR NEGATIVE 08/11/2018 0730    Radiological Exams on Admission: Dg Chest 2 View  Result Date: 08/11/2018 CLINICAL DATA:  Recent cholecystectomy. EXAM: CHEST - 2 VIEW COMPARISON:  08/04/2018 FINDINGS: Mild hazy density at the bases with small pleural effusions. Borderline heart size, stable. Dual-chamber pacer leads from the left in unremarkable position. IMPRESSION: Small pleural effusions with mild atelectasis or infection at the bases. Electronically Signed   By: Monte Fantasia M.D.   On: 08/11/2018 07:46   Ct Angio Chest Pe W/cm &/or Wo Cm  Result Date: 08/11/2018 CLINICAL DATA:  Shortness of  breath. Status post recent cholecystectomy EXAM: CT ANGIOGRAPHY CHEST WITH CONTRAST TECHNIQUE: Multidetector CT imaging of the chest was performed using the standard protocol during bolus administration of intravenous contrast. Multiplanar CT image reconstructions and MIPs were obtained to evaluate the vascular anatomy. CONTRAST:  141m OMNIPAQUE IOHEXOL 350 MG/ML SOLN COMPARISON:  Chest CT April 21, 2015; chest radiograph August 06, 2015 FINDINGS: Cardiovascular: There is no demonstrable pulmonary embolus. There is no thoracic aortic aneurysm or dissection. Visualized great vessels appear unremarkable. Note that the right innominate and left common carotid arteries arise as a common trunk, an anatomic variant. There is no evident pericardial effusion or pericardial thickening. Pacemaker leads are attached to the right atrium and right ventricle. Mediastinum/Nodes: Thyroid is rather diminutive. Thyroid appears unremarkable. There is no evident thoracic adenopathy. No esophageal lesions are appreciable. Lungs/Pleura: There are small pleural effusions bilaterally with compressive atelectasis in the lung base regions. There may be mild airspace consolidation associated with atelectasis in the posterior right base. Lungs elsewhere are clear. Upper Abdomen: Gallbladder is absent. There is a focal area of fluid and air in the gallbladder fossa region, incompletely visualized which measures 3.8 x 2.4 cm. This focus of fluid and air may well be due to the recent cholecystectomy. It should be noted that from an imaging standpoint, a developing abscess in this area cannot be excluded. Visualized upper abdominal structures otherwise appear unremarkable. 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 There are no blastic or lytic bone lesions. No chest wall lesions are evident. Review of the MIP images confirms the above findings. IMPRESSION: 1. No demonstrable pulmonary embolus. No  thoracic aortic aneurysm or dissection. 2. Incomplete visualization of fluid and air collection within the gallbladder fossa region. This fluid and air may well be secondary to recent cholecystectomy. From an imaging standpoint, developing abscess in this area cannot be excluded. Close clinical  surveillance in this regard advised. Repeat imaging of this area may be warranted if patient show symptoms suggesting infection in this area. 3. Small pleural effusions bilaterally with compressive atelectasis in the lung base regions. Early pneumonia superimposed with atelectasis in the right base cannot be entirely excluded. 4.  No demonstrable thoracic adenopathy. 5. Degenerative change in the thoracic spine with a degree of diffuse idiopathic skeletal hyperostosis, also present previously. 6.  Pacemaker leads attached to right atrium and right ventricle. Electronically Signed   By: Lowella Grip III M.D.   On: 08/11/2018 09:06    EKG: Independently reviewed.  Sinus rhythm with atrial premature beats.  No ST-T wave changes.  Assessment/Plan Active Problems:   Type 2 diabetes mellitus (HCC)   Paroxysmal A-fib (HCC)   Essential hypertension   Gastroesophageal reflux disease without esophagitis   Morbid obesity ([place])   HCAP (healthcare-associated pneumonia)    H CAP: I do not suspect MRSA infection the patient and he does not carry any history of MRSA in the past so I will start him on Zosyn.  He is COVID-19 negative.  I will check respiratory viral panel, sputum culture, urine antigen for Legionella and streptococci.  Paroxysmal atrial fibrillation: Currently in sinus rhythm.  Resume home medications along with anticoagulation.  Type 2 diabetes mellitus: Not on any medications from home.  Start on SSI.  Hypertension: Blood sugar control will resume home medications.  Recent E. coli bacteremia: Patient was supposed to continue Augmentin for few more days.  Giving him Zosyn for H CAP will cover  gram-negative bacteremia.  DVT prophylaxis: Eliquis Code Status: Full code Family Communication: None present at bedside.  Patient alert and oriented and competent.  Discussed plan of care with the patient in detail. Disposition Plan: Likely home in next 2 to 3 days. Consults called: None Admission status: Inpatient   Darliss Cheney MD [facility] Pager [phone]  If 7PM-7AM, please contact night-coverage [email or web address] Password [facility]  08/11/2018, 4:47 PM

## 2018-08-11 NOTE — ED Notes (Signed)
[facility] notified (Kimberly) - patient ready for transport 

## 2018-08-11 NOTE — ED Notes (Signed)
Contacted [facility] Bertram Millard) - she will repage Dr. Doristine Bosworth

## 2018-08-11 NOTE — ED Triage Notes (Signed)
Patient states woke up this am with shortness of breath and felt hot; states pain on right flank area. Ambulatory with steady gait.

## 2018-08-11 NOTE — ED Notes (Signed)
ED MD informed of BP 94/49

## 2018-08-11 NOTE — Progress Notes (Signed)
Pharmacy Antibiotic Note  Derek Curry is a 78 y.o. male admitted on 08/11/2018 with pneumonia.  Pharmacy has been consulted for vancomycin dosing. Pt is afebrile and WBC is WNL. Scr and lactic acid are also WNL. Pt with history of recent EColi bacteremia, currently on augmentin for treatment.    Plan: Vancomycin 1gm IV x 1 then 750mg  IV Q12H F/u renal fxn, C&S, clinical status and peak/trough at [facility] F/u continuation of cefepime or other gram negative coverage  Height: 5\' 8"  (172.7 cm) Weight: 173 lb 1 oz (78.5 kg) IBW/kg (Calculated) : 68.4  Temp (24hrs), Avg:97.6 F (36.4 C), Min:97.6 F (36.4 C), Max:97.6 F (36.4 C)  Recent Labs  Lab 08/06/18 0734 08/07/18 0537 08/08/18 0029 08/09/18 0427 08/11/18 0734  WBC 7.4 10.1 11.6* 9.0 8.6  CREATININE 1.17 1.11 1.09 1.05 1.04  LATICACIDVEN  --   --   --   --  0.9    Estimated Creatinine Clearance: 57.5 mL/min (by C-G formula based on SCr of 1.04 mg/dL).    Allergies  Allergen Reactions  . Benazepril Anaphylaxis  . Shellfish Allergy Swelling  . Shellfish Allergy Anaphylaxis  . Vicodin [Hydrocodone-Acetaminophen] Other (See Comments)    Dizzy  . Vicodin [Hydrocodone-Acetaminophen]     Mental status changes     Antimicrobials this admission: Vanc 6/27>> Cefepime x 1 6/27  Dose adjustments this admission: N/A  Microbiology results: Pending  Thank you for allowing pharmacy to be a part of this patient's care.  Maralee Higuchi, Rande Lawman 08/11/2018 10:09 AM

## 2018-08-11 NOTE — ED Notes (Signed)
Patient transported to CT 

## 2018-08-12 DIAGNOSIS — E119 Type 2 diabetes mellitus without complications: Secondary | ICD-10-CM

## 2018-08-12 LAB — RESPIRATORY PANEL BY PCR

## 2018-08-12 LAB — CBC WITH DIFFERENTIAL/PLATELET
Abs Immature Granulocytes: 0.04 10*3/uL (ref 0.00–0.07)
Basophils Absolute: 0 10*3/uL (ref 0.0–0.1)
Basophils Relative: 0 %
Eosinophils Absolute: 0.1 10*3/uL (ref 0.0–0.5)
Eosinophils Relative: 1 %
HCT: 30.7 % — ABNORMAL LOW (ref 39.0–52.0)
Hemoglobin: 9.8 g/dL — ABNORMAL LOW (ref 13.0–17.0)
Immature Granulocytes: 1 %
Lymphocytes Relative: 18 %
Lymphs Abs: 1.2 10*3/uL (ref 0.7–4.0)
MCH: 33.6 pg (ref 26.0–34.0)
MCHC: 31.9 g/dL (ref 30.0–36.0)
MCV: 105.1 fL — ABNORMAL HIGH (ref 80.0–100.0)
Monocytes Absolute: 0.7 10*3/uL (ref 0.1–1.0)
Monocytes Relative: 10 %
Neutro Abs: 4.9 10*3/uL (ref 1.7–7.7)
Neutrophils Relative %: 70 %
Platelets: 238 10*3/uL (ref 150–400)
RBC: 2.92 MIL/uL — ABNORMAL LOW (ref 4.22–5.81)
RDW: 14.2 % (ref 11.5–15.5)
WBC: 7 10*3/uL (ref 4.0–10.5)
nRBC: 0 % (ref 0.0–0.2)

## 2018-08-12 LAB — COMPREHENSIVE METABOLIC PANEL
ALT: 48 U/L — ABNORMAL HIGH (ref 0–44)
AST: 27 U/L (ref 15–41)
Albumin: 2.6 g/dL — ABNORMAL LOW (ref 3.5–5.0)
Alkaline Phosphatase: 131 U/L — ABNORMAL HIGH (ref 38–126)
Anion gap: 10 (ref 5–15)
BUN: 15 mg/dL (ref 8–23)
CO2: 25 mmol/L (ref 22–32)
Calcium: 9 mg/dL (ref 8.9–10.3)
Chloride: 102 mmol/L (ref 98–111)
Creatinine, Ser: 0.99 mg/dL (ref 0.61–1.24)
GFR calc Af Amer: >60 mL/min (ref >60)
GFR calc non Af Amer: >60 mL/min (ref >60)
Glucose, Bld: 119 mg/dL — ABNORMAL HIGH (ref 70–99)
Potassium: 3.9 mmol/L (ref 3.5–5.1)
Sodium: 137 mmol/L (ref 135–145)
Total Bilirubin: 1.2 mg/dL (ref 0.3–1.2)
Total Protein: 6.5 g/dL (ref 6.5–8.1)

## 2018-08-12 LAB — GLUCOSE, CAPILLARY
Glucose-Capillary: 111 mg/dL — ABNORMAL HIGH (ref 70–99)
Glucose-Capillary: 118 mg/dL — ABNORMAL HIGH (ref 70–99)
Glucose-Capillary: 105 mg/dL — ABNORMAL HIGH (ref 70–99)
Glucose-Capillary: 137 mg/dL — ABNORMAL HIGH (ref 70–99)

## 2018-08-12 LAB — SARS CORONAVIRUS 2 BY RT PCR (HOSPITAL ORDER, PERFORMED IN ~~LOC~~ HOSPITAL LAB): SARS Coronavirus 2: NEGATIVE

## 2018-08-12 LAB — HIV ANTIBODY (ROUTINE TESTING W REFLEX): HIV Screen 4th Generation wRfx: NONREACTIVE

## 2018-08-12 LAB — STREP PNEUMONIAE URINARY ANTIGEN: Strep Pneumo Urinary Antigen: NEGATIVE

## 2018-08-12 MED ORDER — VITAMIN B-12 1000 MCG PO TABS
1000.0000 ug | ORAL_TABLET | Freq: Every day | ORAL | Status: DC
  Administered 2018-08-12 – 2018-08-15 (×4): 1000 ug via ORAL
  Filled 2018-08-12 (×4): qty 1

## 2018-08-12 MED ORDER — IPRATROPIUM-ALBUTEROL 0.5-2.5 (3) MG/3ML IN SOLN
3.0000 mL | Freq: Two times a day (BID) | RESPIRATORY_TRACT | Status: DC
  Administered 2018-08-12 – 2018-08-13 (×2): 3 mL via RESPIRATORY_TRACT
  Filled 2018-08-12 (×2): qty 3

## 2018-08-12 MED ORDER — IPRATROPIUM-ALBUTEROL 0.5-2.5 (3) MG/3ML IN SOLN
3.0000 mL | Freq: Three times a day (TID) | RESPIRATORY_TRACT | Status: DC
  Administered 2018-08-12: 15:00:00 3 mL via RESPIRATORY_TRACT
  Filled 2018-08-12: qty 3

## 2018-08-12 MED ORDER — IPRATROPIUM-ALBUTEROL 0.5-2.5 (3) MG/3ML IN SOLN: 3.0000 mL | Freq: Three times a day (TID) | RESPIRATORY_TRACT | Status: DC

## 2018-08-12 NOTE — Plan of Care (Signed)
Patient lying in bed this morning; droplet/contact precautions were discontinued overnight. Patient with no complaints at present time. Will continue to monitor.

## 2018-08-12 NOTE — Progress Notes (Signed)
Rapid COVID test from 08/12/2018 morning returned negative. MD paged with results. New order received to discontinue droplet/contact precautions.

## 2018-08-12 NOTE — Progress Notes (Signed)
PROGRESS NOTE  Derek Curry EZM:[id number] DOB: 1940-05-05 DOA: 08/11/2018 PCP: Jefm Petty, MD  HPI/Recap of past 24 hours: HPI from Dr Greggory Keen is a 78 y.o. male with medical history significant of type 2 diabetes mellitus, GERD, hypertension, paroxysmal atrial fibrillation and recent hospitalization for cholelithiasis and elevated liver enzymes status post laparoscopic cholecystectomy on 08/06/2018 presented to ER with a complaint of shortness of breath.  Patient was discharged from the hospital on 08/09/2018 after staying here for 5 days where he was diagnosed with acute cholangitis, acute cholelithiasis status post cholecystectomy and gram-negative bacteremia and was discharged on oral Augmentin.  According to patient, he woke up with shortness of breath and felt hot as well as sweaty.  He did not check his temperature.  He also had some nonproductive cough.  No chills, dizziness, any other complaint.  He has been home since discharge so there is no sick contact or any recent travel. In the ED ([facility]), pt was hemodynamically stable and then he dropped his blood pressure little bit with the lowest being 97/53.  He received some IV fluids.  CBC and CMP were within normal limits.  CT angiogram of the chest was done which ruled out PE however it showed bilateral small pleural effusion with atelectasis and possible consolidation.  Patient was diagnosed with healthcare associated pneumonia and he was given a dose of cefepime and vancomycin and transferred to [facility] for [facility]. Patient tested negative for COVID-19.  He was not hypoxic.  CT also showed right upper quadrant abdominal postcholecystectomy changes/fluid.    Today, patient reports feeling better, denies any worsening shortness of breath worsening cough, denies any fever/chills.  Patient still reports some very mild right upper quadrant abdominal pain, denies it worsening.     Assessment/Plan: Active Problems:   Type 2 diabetes mellitus (HCC)   Paroxysmal A-fib (HCC)   Essential hypertension   Gastroesophageal reflux disease without esophagitis   Morbid obesity ([place])   HCAP (healthcare-associated pneumonia)   ?HCAP Afebrile, no leukocytosis Respiratory viral panel negative Strep pneumo negative, urine Legionella pending COVID test done at [facility] was negative (will repeat as med Center covid tests are not reliable) BC x2 pending CTA chest showed small pleural effusions bilaterally with compressive atelectasis in the lung base regions, early pneumonia superimposed with atelectasis in the right base cannot be entirely excluded Continue IV Zosyn Supplemental O2 prn, duoneb  Recent E. coli bacteremia Repeat blood cultures pending CTA chest: Showed some fluid and air collection within the gallbladder fossa region which may be secondary to recent cholecystectomy.  Developing abscess in this area cannot be excluded advised clinical correlation.  Repeat imaging may be needed if patient shows symptoms of infection. Continue IV Zosyn  Hypertension BP currently soft Hold home lisinopril, HCTZ  Paroxysmal A. Fib Rate controlled Continue diltiazem, Eliquis  Diabetes mellitus type 2 A1c on 08/11/2018 showed 6.9 Not on any meds at home Continue SSI, hypoglycemic protocol, Accu-Cheks Follow-up with PCP, may need to start ? Metformin  Macrocytic anemia Vitamin B12 348, will start supplementation Folate 8.9 Monitor closely, daily CBC  GERD Continue PPI         Malnutrition Type:      Malnutrition Characteristics:      Nutrition Interventions:       Estimated body mass index is 26.31 kg/m as calculated from the following:   Height as of this encounter: 5\' 8"  (1.727 m).  Weight as of this encounter: 78.5 kg.     Code Status: Full  Family Communication: None at bedside  Disposition Plan: Home once work-up complete    Consultants:  None  Procedures:  None  Antimicrobials:  IV Zosyn  DVT prophylaxis: Eliquis   Objective: Vitals:   08/11/18 1430 08/11/18 1618 08/11/18 2128 08/12/18 0541  BP: 127/73 116/63 116/66 119/69  Pulse: (!) 59 (!) 54 60 60  Resp: 14 16 16 18   Temp:  98 F (36.7 C) 98.8 F (37.1 C) 98.6 F (37 C)  TempSrc:  Oral Oral Oral  SpO2: 99% 100% 99% 100%  Weight:      Height:        Intake/Output Summary (Last 24 hours) at 08/12/2018 1107 Last data filed at 08/12/2018 1053 Gross per 24 hour  Intake 1953.14 ml  Output 3575 ml  Net -1621.86 ml   Filed Weights   08/11/18 0705  Weight: 78.5 kg    Exam:  General: NAD   Cardiovascular: S1, S2 present  Respiratory: CTAB  Abdomen: Soft, nontender, nondistended, bowel sounds present  Musculoskeletal: No bilateral pedal edema noted  Skin: Normal  Psychiatry: Normal mood   Data Reviewed: CBC: Recent Labs  Lab 08/06/18 0734 08/07/18 0537 08/08/18 0029 08/09/18 0427 08/11/18 0734 08/12/18 0351  WBC 7.4 10.1 11.6* 9.0 8.6 7.0  NEUTROABS 5.6 8.8* 10.6*  --  5.7 4.9  HGB 11.0* 11.6* 11.8* 10.4* 10.9* 9.8*  HCT 33.9* 36.4* 36.5* 31.6* 33.2* 30.7*  MCV 102.7* 105.2* 103.7* 103.6* 102.8* 105.1*  PLT 146* 158 175 182 228 353   Basic Metabolic Panel: Recent Labs  Lab 08/06/18 0734 08/07/18 0537 08/08/18 0029 08/09/18 0427 08/11/18 0734 08/12/18 0351  NA 138 136 135 135 136 137  K 3.9 4.3 5.1 4.3 3.8 3.9  CL 107 105 106 109 102 102  CO2 21* 20* 17* 21* 22 25  GLUCOSE 92 99 156* 146* 123* 119*  BUN 13 11 19  25* 17 15  CREATININE 1.17 1.11 1.09 1.05 1.04 0.99  CALCIUM 9.0 9.1 9.1 9.1 9.2 9.0  MG 1.6* 1.7 1.8  --   --   --   PHOS 2.7 3.6 3.4  --   --   --    GFR: Estimated Creatinine Clearance: 60.5 mL/min (by C-G formula based on SCr of 0.99 mg/dL). Liver Function Tests: Recent Labs  Lab 08/06/18 0734 08/07/18 0537 08/08/18 0029 08/11/18 0734 08/12/18 0351  AST 31 56* 47* 14* 27  ALT  64* 65* 58* 30 48*  ALKPHOS 136* 129* 117 109 131*  BILITOT 1.5* 1.3* 1.4* 1.1 1.2  PROT 6.4* 6.6 6.9 6.8 6.5  ALBUMIN 2.6* 2.6* 2.6* 3.0* 2.6*   Recent Labs  Lab 08/07/18 0537 08/11/18 0734  LIPASE 17 21   No results for input(s): AMMONIA in the last 168 hours. Coagulation Profile: No results for input(s): INR, PROTIME in the last 168 hours. Cardiac Enzymes: No results for input(s): CKTOTAL, CKMB, CKMBINDEX, TROPONINI in the last 168 hours. BNP (last 3 results) No results for input(s): PROBNP in the last 8760 hours. HbA1C: Recent Labs    08/11/18 1706  HGBA1C 6.9*   CBG: Recent Labs  Lab 08/08/18 0803 08/09/18 0853 08/11/18 1747 08/11/18 2125 08/12/18 0748  GLUCAP 143* 119* 101* 151* 111*   Lipid Profile: No results for input(s): CHOL, HDL, LDLCALC, TRIG, CHOLHDL, LDLDIRECT in the last 72 hours. Thyroid Function Tests: No results for input(s): TSH, T4TOTAL, FREET4, T3FREE, THYROIDAB in the last  72 hours. Anemia Panel: No results for input(s): VITAMINB12, FOLATE, FERRITIN, TIBC, IRON, RETICCTPCT in the last 72 hours. Urine analysis:    Component Value Date/Time   COLORURINE YELLOW 08/11/2018 0730   APPEARANCEUR CLEAR 08/11/2018 0730   LABSPEC 1.020 08/11/2018 0730   PHURINE 6.0 08/11/2018 0730   GLUCOSEU NEGATIVE 08/11/2018 0730   HGBUR NEGATIVE 08/11/2018 0730   BILIRUBINUR NEGATIVE 08/11/2018 0730   KETONESUR NEGATIVE 08/11/2018 0730   PROTEINUR NEGATIVE 08/11/2018 0730   NITRITE NEGATIVE 08/11/2018 0730   LEUKOCYTESUR NEGATIVE 08/11/2018 0730   Sepsis Labs: @LABRCNTIP (procalcitonin:4,lacticidven:4)  ) Recent Results (from the past 240 hour(s))  SARS Coronavirus 2 (Hosp order,Performed in [place] lab via Abbott ID)     Status: None   Collection Time: 08/04/18 12:33 AM   Specimen: Dry Nasal Swab (Abbott ID Now)  Result Value Ref Range Status   SARS Coronavirus 2 (Abbott ID Now) NEGATIVE NEGATIVE Final    Comment: (NOTE) SARS-CoV-2 target nucleic  acids are NOT DETECTED. The SARS-CoV-2 RNA is generally detectable in upper and lower respiratory specimens during the acute phase of infection.  Negativeresults do not preclude SARS-CoV-2 infection, do not rule out coinfections with other pathogens, and should not be used as the  sole basis for treatment or other patient management decisions.  Negative results must be combined with clinical observations, patient history, and epidemiological information. The expected result is Negative. Fact Sheet for Patients: GolfingFamily.no Fact Sheet for Healthcare Providers: https://www.hernandez-brewer.com/ This test is not yet approved or cleared by the [place] FDA and  has been authorized for detection and/or diagnosis of SARS-CoV-2 by FDA under an Emergency Use Authorization (EUA).  This EUA will remain in effect (meaning this test can be used) for the duration of  the COVID19 declaration under Section 5 64(b)(1) of the Act, 21 U.S.C.  section [phone] 3(b)(1), unless the authorization is terminated or revoked sooner. Performed at [facility], [address] [place], [place] [address]   Culture, blood (Routine X 2) w Reflex to ID Panel     Status: Abnormal   Collection Time: 08/04/18 12:43 AM   Specimen: BLOOD  Result Value Ref Range Status   Specimen Description   Final    BLOOD RIGHT ANTECUBITAL Performed at [facility], [address] [place], [place] [address]    Special Requests   Final    BOTTLES DRAWN AEROBIC AND ANAEROBIC Blood Culture adequate volume Performed at [facility], [address] [place], [place] [address]    Culture  Setup Time   Final    GRAM NEGATIVE RODS IN BOTH AEROBIC AND ANAEROBIC BOTTLES CRITICAL RESULT CALLED TO, READ BACK BY AND VERIFIED WITH: M.BELL,PHARMD 2337 08/04/2018 M.CAMPBELL Performed at [facility], [place] [address], [place], [place] [address]    Culture  ESCHERICHIA COLI (A)  Final   Report Status 08/06/2018 FINAL  Final   Organism ID, Bacteria ESCHERICHIA COLI  Final      Susceptibility   Escherichia coli - MIC*    AMPICILLIN 8 SENSITIVE Sensitive     CEFAZOLIN <=4 SENSITIVE Sensitive     CEFEPIME <=1 SENSITIVE Sensitive     CEFTAZIDIME <=1 SENSITIVE Sensitive     CEFTRIAXONE <=1 SENSITIVE Sensitive     CIPROFLOXACIN <=0.25 SENSITIVE Sensitive     GENTAMICIN <=1 SENSITIVE Sensitive     IMIPENEM <=0.25 SENSITIVE Sensitive     TRIMETH/SULFA <=20 SENSITIVE Sensitive     AMPICILLIN/SULBACTAM  4 SENSITIVE Sensitive     PIP/TAZO <=4 SENSITIVE Sensitive     Extended ESBL NEGATIVE Sensitive     * ESCHERICHIA COLI  Blood Culture ID Panel (Reflexed)     Status: Abnormal   Collection Time: 08/04/18 12:43 AM  Result Value Ref Range Status   Enterococcus species NOT DETECTED NOT DETECTED Final   Listeria monocytogenes NOT DETECTED NOT DETECTED Final   Staphylococcus species NOT DETECTED NOT DETECTED Final   Staphylococcus aureus (BCID) NOT DETECTED NOT DETECTED Final   Streptococcus species NOT DETECTED NOT DETECTED Final   Streptococcus agalactiae NOT DETECTED NOT DETECTED Final   Streptococcus pneumoniae NOT DETECTED NOT DETECTED Final   Streptococcus pyogenes NOT DETECTED NOT DETECTED Final   Acinetobacter baumannii NOT DETECTED NOT DETECTED Final   Enterobacteriaceae species DETECTED (A) NOT DETECTED Final    Comment: Enterobacteriaceae represent a large family of gram-negative bacteria, not a single organism. CRITICAL RESULT CALLED TO, READ BACK BY AND VERIFIED WITH: M.BELL,PHARMD 2337 08/04/2018 M.CAMPBELL    Enterobacter cloacae complex NOT DETECTED NOT DETECTED Final   Escherichia coli DETECTED (A) NOT DETECTED Final    Comment: CRITICAL RESULT CALLED TO, READ BACK BY AND VERIFIED WITH: M.BELL,PHARMD 2337 08/04/2018 M.CAMPBELL    Klebsiella oxytoca NOT DETECTED NOT DETECTED Final   Klebsiella pneumoniae NOT DETECTED NOT DETECTED  Final   Proteus species NOT DETECTED NOT DETECTED Final   Serratia marcescens NOT DETECTED NOT DETECTED Final   Carbapenem resistance NOT DETECTED NOT DETECTED Final   Haemophilus influenzae NOT DETECTED NOT DETECTED Final   Neisseria meningitidis NOT DETECTED NOT DETECTED Final   Pseudomonas aeruginosa NOT DETECTED NOT DETECTED Final   Candida albicans NOT DETECTED NOT DETECTED Final   Candida glabrata NOT DETECTED NOT DETECTED Final   Candida krusei NOT DETECTED NOT DETECTED Final   Candida parapsilosis NOT DETECTED NOT DETECTED Final   Candida tropicalis NOT DETECTED NOT DETECTED Final    Comment: Performed at [facility], [place] [address], [place], [place] [address]  Culture, blood (Routine X 2) w Reflex to ID Panel     Status: Abnormal   Collection Time: 08/04/18 12:50 AM   Specimen: BLOOD  Result Value Ref Range Status   Specimen Description   Final    BLOOD LEFT ANTECUBITAL Performed at [facility], [address] [place], [place] [address]    Special Requests   Final    BOTTLES DRAWN AEROBIC AND ANAEROBIC Blood Culture adequate volume Performed at [facility], [address] [place], [place] [address]    Culture  Setup Time   Final    GRAM NEGATIVE RODS IN BOTH AEROBIC AND ANAEROBIC BOTTLES CRITICAL VALUE NOTED.  VALUE IS CONSISTENT WITH PREVIOUSLY REPORTED AND CALLED VALUE. Performed at [facility], [place] [address] [place], [place] [address]    Culture ESCHERICHIA COLI (A)  Final   Report Status 08/06/2018 FINAL  Final  SARS Coronavirus 2 (CEPHEID - Performed in [place] hospital lab), Hosp Order     Status: None   Collection Time: 08/04/18  6:25 AM   Specimen: Nasopharyngeal Swab  Result Value Ref Range Status   SARS Coronavirus 2 NEGATIVE NEGATIVE Final    Comment: (NOTE) If result is NEGATIVE SARS-CoV-2 target nucleic acids are NOT DETECTED. The SARS-CoV-2 RNA is generally detectable in upper and lower  respiratory  specimens during the acute phase of infection. The lowest  concentration of SARS-CoV-2 viral copies this assay can  detect is 250  copies / mL. A negative result does not preclude SARS-CoV-2 infection  and should not be used as the sole basis for treatment or other  patient management decisions.  A negative result may occur with  improper specimen collection / handling, submission of specimen other  than nasopharyngeal swab, presence of viral mutation(s) within the  areas targeted by this assay, and inadequate number of viral copies  (<250 copies / mL). A negative result must be combined with clinical  observations, patient history, and epidemiological information. If result is POSITIVE SARS-CoV-2 target nucleic acids are DETECTED. The SARS-CoV-2 RNA is generally detectable in upper and lower  respiratory specimens dur ing the acute phase of infection.  Positive  results are indicative of active infection with SARS-CoV-2.  Clinical  correlation with patient history and other diagnostic information is  necessary to determine patient infection status.  Positive results do  not rule out bacterial infection or co-infection with other viruses. If result is PRESUMPTIVE POSTIVE SARS-CoV-2 nucleic acids MAY BE PRESENT.   A presumptive positive result was obtained on the submitted specimen  and confirmed on repeat testing.  While 2019 novel coronavirus  (SARS-CoV-2) nucleic acids may be present in the submitted sample  additional confirmatory testing may be necessary for epidemiological  and / or clinical management purposes  to differentiate between  SARS-CoV-2 and other Sarbecovirus currently known to infect humans.  If clinically indicated additional testing with an alternate test  methodology [phone]) is advised. The SARS-CoV-2 RNA is generally  detectable in upper and lower respiratory sp ecimens during the acute  phase of infection. The expected result is Negative. Fact Sheet for  Patients:  StrictlyIdeas.no Fact Sheet for Healthcare Providers: BankingDealers.co.za This test is not yet approved or cleared by the [place] FDA and has been authorized for detection and/or diagnosis of SARS-CoV-2 by FDA under an Emergency Use Authorization (EUA).  This EUA will remain in effect (meaning this test can be used) for the duration of the COVID-19 declaration under Section 564(b)(1) of the Act, 21 U.S.C. section 360bbb-3(b)(1), unless the authorization is terminated or revoked sooner. Performed at [facility], [place] [address] [place], [place] [address]   Surgical PCR screen     Status: None   Collection Time: 08/06/18  7:42 AM   Specimen: Nasal Mucosa; Nasal Swab  Result Value Ref Range Status   MRSA, PCR NEGATIVE NEGATIVE Final   Staphylococcus aureus NEGATIVE NEGATIVE Final    Comment: (NOTE) The Xpert SA Assay (FDA approved for NASAL specimens in patients 63 years of age and older), is one component of a comprehensive surveillance program. It is not intended to diagnose infection nor to guide or monitor treatment. Performed at [facility], [place] [address] [place], [place] [address]   SARS Coronavirus 2 (Hosp order,Performed in [facility] lab via Abbott ID)     Status: None   Collection Time: 08/11/18 11:10 AM   Specimen: Dry Nasal Swab (Abbott ID Now)  Result Value Ref Range Status   SARS Coronavirus 2 (Abbott ID Now) NEGATIVE NEGATIVE Final    Comment: (NOTE) Interpretive Result Comment(s): COVID 19 Positive SARS CoV 2 target nucleic acids are DETECTED. The SARS CoV 2 RNA is generally detectable in upper and lower respiratory specimens during the acute phase of infection.  Positive results are indicative of active infection with SARS CoV 2.  Clinical correlation with patient history and other diagnostic information is necessary to determine patient infection  status.   Positive results do not rule out bacterial infection or coinfection with other viruses. The expected result is Negative. COVID 19 Negative SARS CoV 2 target nucleic acids are NOT DETECTED. The SARS CoV 2 RNA is generally detectable in upper and lower respiratory specimens during the acute phase of infection.  Negative results do not preclude SARS CoV 2 infection, do not rule out coinfections with other pathogens, and should not be used as the sole basis for treatment or other patient management decisions.  Negative results must be combined with clinical  observations, patient history, and epidemiological information. The expected result is Negative. Invalid Presence or absence of SARS CoV 2 nucleic acids cannot be determined. Repeat testing was performed on the submitted specimen and repeated Invalid results were obtained.  If clinically indicated, additional testing on a new specimen with an alternate test methodology [phone]) is advised.  The SARS CoV 2 RNA is generally detectable in upper and lower respiratory specimens during the acute phase of infection. The expected result is Negative. Fact Sheet for Patients:  GolfingFamily.no Fact Sheet for Healthcare Providers: https://www.hernandez-brewer.com/ This test is not yet approved or cleared by the [place] FDA and has been authorized for detection and/or diagnosis of SARS CoV 2 by FDA under an Emergency Use Authorization (EUA).  This EUA will remain in effect (meaning this test can be used) for the duration of the COVID19 d eclaration under Section 564(b)(1) of the Act, 21 U.S.C. section [phone] 3(b)(1), unless the authorization is terminated or revoked sooner. Performed at [facility], [address] [place], [place] [address]   Respiratory Panel by PCR     Status: None   Collection Time: 08/11/18  4:30 PM   Specimen: Nasopharyngeal Swab; Respiratory  Result Value Ref  Range Status   Adenovirus NOT DETECTED NOT DETECTED Final   Coronavirus 229E NOT DETECTED NOT DETECTED Final    Comment: (NOTE) The Coronavirus on the Respiratory Panel, DOES NOT test for the novel  Coronavirus (2019 nCoV)    Coronavirus HKU1 NOT DETECTED NOT DETECTED Final   Coronavirus NL63 NOT DETECTED NOT DETECTED Final   Coronavirus OC43 NOT DETECTED NOT DETECTED Final   Metapneumovirus NOT DETECTED NOT DETECTED Final   Rhinovirus / Enterovirus NOT DETECTED NOT DETECTED Final   Influenza A NOT DETECTED NOT DETECTED Final   Influenza B NOT DETECTED NOT DETECTED Final   Parainfluenza Virus 1 NOT DETECTED NOT DETECTED Final   Parainfluenza Virus 2 NOT DETECTED NOT DETECTED Final   Parainfluenza Virus 3 NOT DETECTED NOT DETECTED Final   Parainfluenza Virus 4 NOT DETECTED NOT DETECTED Final   Respiratory Syncytial Virus NOT DETECTED NOT DETECTED Final   Bordetella pertussis NOT DETECTED NOT DETECTED Final   Chlamydophila pneumoniae NOT DETECTED NOT DETECTED Final   Mycoplasma pneumoniae NOT DETECTED NOT DETECTED Final    Comment: Performed at [facility], [place]. [address] [place], [place] [address]  Culture, blood (routine x 2)     Status: None (Preliminary result)   Collection Time: 08/11/18  5:05 PM   Specimen: Right Antecubital; Blood  Result Value Ref Range Status   Specimen Description   Final    RIGHT ANTECUBITAL Performed at [place] [address], [place], [place] [address]    Special Requests   Final    BOTTLES DRAWN AEROBIC ONLY Blood Culture adequate volume Performed at [place] [address] [place], [place] [address]  Culture   Final    NO GROWTH < 12 HOURS Performed at [facility], [place] [address], [place], [place] [address]    Report Status PENDING  Incomplete  Culture, blood (routine x 2)     Status: None (Preliminary result)   Collection Time: 08/11/18  5:05 PM   Specimen: BLOOD  Result Value  Ref Range Status   Specimen Description   Final    BLOOD RIGHT ARM Performed at [place] [address] [place], [place] [address]    Special Requests   Final    BOTTLES DRAWN AEROBIC ONLY Blood Culture adequate volume Performed at [place] [address] [place], [place] [address]    Culture   Final    NO GROWTH < 12 HOURS Performed at [facility] [address], [place],  [address]    Report Status PENDING  Incomplete      Studies: No results found.  Scheduled Meds: . allopurinol  300 mg Oral Daily  . apixaban  5 mg Oral BID  . diltiazem  240 mg Oral Daily  . insulin aspart  0-15 Units Subcutaneous TID WC  . insulin aspart  0-5 Units Subcutaneous QHS  . magnesium oxide  200 mg Oral Daily  . pantoprazole  40 mg Oral Daily  . simvastatin  10 mg Oral Daily    Continuous Infusions: . sodium chloride Stopped (08/11/18 1211)  . piperacillin-tazobactam (ZOSYN)  IV 12.5 mL/hr at 08/12/18 0600     LOS: 1 day     Alma Friendly, MD [facility]  If 7PM-7AM, please contact night-coverage [email or web address] 08/12/2018, 11:07 AM

## 2018-08-13 ENCOUNTER — Other Ambulatory Visit: Payer: Self-pay

## 2018-08-13 LAB — BASIC METABOLIC PANEL
Anion gap: 9 (ref 5–15)
BUN: 14 mg/dL (ref 8–23)
CO2: 25 mmol/L (ref 22–32)
Calcium: 8.9 mg/dL (ref 8.9–10.3)
Chloride: 102 mmol/L (ref 98–111)
Creatinine, Ser: 1.3 mg/dL — ABNORMAL HIGH (ref 0.61–1.24)
GFR calc Af Amer: >60 mL/min (ref >60)
GFR calc non Af Amer: 53 mL/min — ABNORMAL LOW (ref >60)
Glucose, Bld: 116 mg/dL — ABNORMAL HIGH (ref 70–99)
Potassium: 4 mmol/L (ref 3.5–5.1)
Sodium: 136 mmol/L (ref 135–145)

## 2018-08-13 LAB — CBC WITH DIFFERENTIAL/PLATELET
Abs Immature Granulocytes: 0.05 10*3/uL (ref 0.00–0.07)
Basophils Absolute: 0 10*3/uL (ref 0.0–0.1)
Basophils Relative: 0 %
Eosinophils Absolute: 0.1 10*3/uL (ref 0.0–0.5)
Eosinophils Relative: 1 %
HCT: 30.3 % — ABNORMAL LOW (ref 39.0–52.0)
Hemoglobin: 9.6 g/dL — ABNORMAL LOW (ref 13.0–17.0)
Immature Granulocytes: 1 %
Lymphocytes Relative: 22 %
Lymphs Abs: 1.5 10*3/uL (ref 0.7–4.0)
MCH: 33.4 pg (ref 26.0–34.0)
MCHC: 31.7 g/dL (ref 30.0–36.0)
MCV: 105.6 fL — ABNORMAL HIGH (ref 80.0–100.0)
Monocytes Absolute: 0.6 10*3/uL (ref 0.1–1.0)
Monocytes Relative: 8 %
Neutro Abs: 4.7 10*3/uL (ref 1.7–7.7)
Neutrophils Relative %: 68 %
Platelets: 252 10*3/uL (ref 150–400)
RBC: 2.87 MIL/uL — ABNORMAL LOW (ref 4.22–5.81)
RDW: 14.1 % (ref 11.5–15.5)
WBC: 6.9 10*3/uL (ref 4.0–10.5)
nRBC: 0 % (ref 0.0–0.2)

## 2018-08-13 LAB — GLUCOSE, CAPILLARY
Glucose-Capillary: 122 mg/dL — ABNORMAL HIGH (ref 70–99)
Glucose-Capillary: 132 mg/dL — ABNORMAL HIGH (ref 70–99)
Glucose-Capillary: 117 mg/dL — ABNORMAL HIGH (ref 70–99)
Glucose-Capillary: 111 mg/dL — ABNORMAL HIGH (ref 70–99)

## 2018-08-13 LAB — LEGIONELLA PNEUMOPHILA SEROGP 1 UR AG: L. pneumophila Serogp 1 Ur Ag: NEGATIVE

## 2018-08-13 MED ORDER — SODIUM CHLORIDE 0.9 % IV SOLN
INTRAVENOUS | Status: DC
  Administered 2018-08-13 – 2018-08-15 (×2): via INTRAVENOUS

## 2018-08-13 MED ORDER — ALBUTEROL SULFATE (2.5 MG/3ML) 0.083% IN NEBU
2.5000 mg | INHALATION_SOLUTION | RESPIRATORY_TRACT | Status: DC | PRN
  Administered 2018-08-13: 16:00:00 2.5 mg via RESPIRATORY_TRACT
  Filled 2018-08-13: qty 3

## 2018-08-13 NOTE — Progress Notes (Addendum)
PROGRESS NOTE  Derek Curry TKZ:[id number] DOB: 05/04/1940 DOA: 08/11/2018 PCP: Jefm Petty, MD  HPI/Recap of past 24 hours: HPI from Dr Greggory Keen is a 78 y.o. male with medical history significant of type 2 diabetes mellitus, GERD, hypertension, paroxysmal atrial fibrillation and recent hospitalization for cholelithiasis and elevated liver enzymes status post laparoscopic cholecystectomy on 08/06/2018 presented to ER with a complaint of shortness of breath.  Patient was discharged from the hospital on 08/09/2018 after staying here for 5 days where he was diagnosed with acute cholangitis, acute cholelithiasis status post cholecystectomy and gram-negative bacteremia and was discharged on oral Augmentin.  According to patient, he woke up with shortness of breath and felt hot as well as sweaty.  He did not check his temperature.  He also had some nonproductive cough.  No chills, dizziness, any other complaint.  He has been home since discharge so there is no sick contact or any recent travel. In the ED ([facility]), pt was hemodynamically stable and then he dropped his blood pressure little bit with the lowest being 97/53.  He received some IV fluids.  CBC and CMP were within normal limits.  CT angiogram of the chest was done which ruled out PE however it showed bilateral small pleural effusion with atelectasis and possible consolidation.  Patient was diagnosed with healthcare associated pneumonia and he was given a dose of cefepime and vancomycin and transferred to [facility] for admission. Patient tested negative for COVID-19.  He was not hypoxic.  CT also showed right upper quadrant abdominal postcholecystectomy changes/fluid.    Today, patient reports improvement, denies any new complaints.  Denies any abdominal pain, chest pain, worsening shortness of breath, fever/chills, nausea/vomiting.    Assessment/Plan: Active Problems:   Type 2 diabetes mellitus  (HCC)   Paroxysmal A-fib (HCC)   Essential hypertension   Gastroesophageal reflux disease without esophagitis   Morbid obesity ([place])   HCAP (healthcare-associated pneumonia)   ?HCAP Afebrile, no leukocytosis Respiratory viral panel negative Strep pneumo negative, urine Legionella negative COVID test done negative BC x2 NGTD CTA chest showed small pleural effusions bilaterally with compressive atelectasis in the lung base regions, early pneumonia superimposed with atelectasis in the right base cannot be entirely excluded Continue IV Zosyn Supplemental O2 prn, duoneb  Recent E. coli bacteremia Repeat blood cultures NGTD CTA chest: Showed some fluid and air collection within the gallbladder fossa region which may be secondary to recent cholecystectomy.  Developing abscess in this area cannot be excluded advised clinical correlation.  Repeat imaging may be needed if patient shows symptoms of infection. Continue IV Zosyn  AKI ?  Contrast induced (CTA chest on 6/27) IV fluid for 1 L only BMP in the a.m.  Hypertension BP currently soft Hold home lisinopril, HCTZ  Paroxysmal A. Fib Rate controlled Continue diltiazem, Eliquis  Diabetes mellitus type 2 A1c on 08/11/2018 showed 6.9 Not on any meds at home Continue SSI, hypoglycemic protocol, Accu-Cheks Follow-up with PCP, may need to start ? Metformin  Macrocytic anemia Vitamin B12 348, continue supplementation Folate 8.9 Monitor closely, daily CBC  GERD Continue PPI         Malnutrition Type:      Malnutrition Characteristics:      Nutrition Interventions:       Estimated body mass index is 26.31 kg/m as calculated from the following:   Height as of this encounter: 5\' 8"  (1.727 m).   Weight as of this encounter: 78.5 kg.  Code Status: Full  Family Communication: None at bedside  Disposition Plan: Home likely on 08/14/2018   Consultants:  None  Procedures:  None  Antimicrobials:  IV  Zosyn  DVT prophylaxis: Eliquis   Objective: Vitals:   08/13/18 0624 08/13/18 0748 08/13/18 1314 08/13/18 1545  BP: 125/70  105/68   Pulse: 60  64   Resp: 18  18   Temp: 98.2 F (36.8 C)  98.4 F (36.9 C)   TempSrc: Oral  Oral   SpO2: 98% 98% 100% 98%  Weight:      Height:        Intake/Output Summary (Last 24 hours) at 08/13/2018 1615 Last data filed at 08/13/2018 0941 Gross per 24 hour  Intake 680.85 ml  Output 2700 ml  Net -2019.15 ml   Filed Weights   08/11/18 0705  Weight: 78.5 kg    Exam:  General: NAD   Cardiovascular: S1, S2 present  Respiratory: CTAB  Abdomen: Soft, nontender, nondistended, bowel sounds present  Musculoskeletal: No bilateral pedal edema noted  Skin: Normal  Psychiatry: Normal mood   Data Reviewed: CBC: Recent Labs  Lab 08/07/18 0537 08/08/18 0029 08/09/18 0427 08/11/18 0734 08/12/18 0351 08/13/18 0356  WBC 10.1 11.6* 9.0 8.6 7.0 6.9  NEUTROABS 8.8* 10.6*  --  5.7 4.9 4.7  HGB 11.6* 11.8* 10.4* 10.9* 9.8* 9.6*  HCT 36.4* 36.5* 31.6* 33.2* 30.7* 30.3*  MCV 105.2* 103.7* 103.6* 102.8* 105.1* 105.6*  PLT 158 175 182 228 238 834   Basic Metabolic Panel: Recent Labs  Lab 08/07/18 0537 08/08/18 0029 08/09/18 0427 08/11/18 0734 08/12/18 0351 08/13/18 0356  NA 136 135 135 136 137 136  K 4.3 5.1 4.3 3.8 3.9 4.0  CL 105 106 109 102 102 102  CO2 20* 17* 21* 22 25 25   GLUCOSE 99 156* 146* 123* 119* 116*  BUN 11 19 25* 17 15 14   CREATININE 1.11 1.09 1.05 1.04 0.99 1.30*  CALCIUM 9.1 9.1 9.1 9.2 9.0 8.9  MG 1.7 1.8  --   --   --   --   PHOS 3.6 3.4  --   --   --   --    GFR: Estimated Creatinine Clearance: 46 mL/min (A) (by C-G formula based on SCr of 1.3 mg/dL (H)). Liver Function Tests: Recent Labs  Lab 08/07/18 0537 08/08/18 0029 08/11/18 0734 08/12/18 0351  AST 56* 47* 14* 27  ALT 65* 58* 30 48*  ALKPHOS 129* 117 109 131*  BILITOT 1.3* 1.4* 1.1 1.2  PROT 6.6 6.9 6.8 6.5  ALBUMIN 2.6* 2.6* 3.0* 2.6*    Recent Labs  Lab 08/07/18 0537 08/11/18 0734  LIPASE 17 21   No results for input(s): AMMONIA in the last 168 hours. Coagulation Profile: No results for input(s): INR, PROTIME in the last 168 hours. Cardiac Enzymes: No results for input(s): CKTOTAL, CKMB, CKMBINDEX, TROPONINI in the last 168 hours. BNP (last 3 results) No results for input(s): PROBNP in the last 8760 hours. HbA1C: Recent Labs    08/11/18 1706  HGBA1C 6.9*   CBG: Recent Labs  Lab 08/12/18 1124 08/12/18 1613 08/12/18 2140 08/13/18 0733 08/13/18 1201  GLUCAP 118* 105* 137* 122* 132*   Lipid Profile: No results for input(s): CHOL, HDL, LDLCALC, TRIG, CHOLHDL, LDLDIRECT in the last 72 hours. Thyroid Function Tests: No results for input(s): TSH, T4TOTAL, FREET4, T3FREE, THYROIDAB in the last 72 hours. Anemia Panel: No results for input(s): VITAMINB12, FOLATE, FERRITIN, TIBC, IRON, RETICCTPCT in the last 72  hours. Urine analysis:    Component Value Date/Time   COLORURINE YELLOW 08/11/2018 0730   APPEARANCEUR CLEAR 08/11/2018 0730   LABSPEC 1.020 08/11/2018 0730   PHURINE 6.0 08/11/2018 0730   GLUCOSEU NEGATIVE 08/11/2018 0730   HGBUR NEGATIVE 08/11/2018 0730   BILIRUBINUR NEGATIVE 08/11/2018 0730   KETONESUR NEGATIVE 08/11/2018 0730   PROTEINUR NEGATIVE 08/11/2018 0730   NITRITE NEGATIVE 08/11/2018 0730   LEUKOCYTESUR NEGATIVE 08/11/2018 0730   Sepsis Labs: @LABRCNTIP (procalcitonin:4,lacticidven:4)  ) Recent Results (from the past 240 hour(s))  SARS Coronavirus 2 (Hosp order,Performed in [place] lab via Abbott ID)     Status: None   Collection Time: 08/04/18 12:33 AM   Specimen: Dry Nasal Swab (Abbott ID Now)  Result Value Ref Range Status   SARS Coronavirus 2 (Abbott ID Now) NEGATIVE NEGATIVE Final    Comment: (NOTE) SARS-CoV-2 target nucleic acids are NOT DETECTED. The SARS-CoV-2 RNA is generally detectable in upper and lower respiratory specimens during the acute phase of infection.   Negativeresults do not preclude SARS-CoV-2 infection, do not rule out coinfections with other pathogens, and should not be used as the  sole basis for treatment or other patient management decisions.  Negative results must be combined with clinical observations, patient history, and epidemiological information. The expected result is Negative. Fact Sheet for Patients: GolfingFamily.no Fact Sheet for Healthcare Providers: https://www.hernandez-brewer.com/ This test is not yet approved or cleared by the [place] FDA and  has been authorized for detection and/or diagnosis of SARS-CoV-2 by FDA under an Emergency Use Authorization (EUA).  This EUA will remain in effect (meaning this test can be used) for the duration of  the COVID19 declaration under Section 5 64(b)(1) of the Act, 21 U.S.C.  section 954 015 2022 3(b)(1), unless the authorization is terminated or revoked sooner. Performed at [facility], [address] [place], [place] [address]   Culture, blood (Routine X 2) w Reflex to ID Panel     Status: Abnormal   Collection Time: 08/04/18 12:43 AM   Specimen: BLOOD  Result Value Ref Range Status   Specimen Description   Final    BLOOD RIGHT ANTECUBITAL Performed at [facility], [address] [place], [place] [address]    Special Requests   Final    BOTTLES DRAWN AEROBIC AND ANAEROBIC Blood Culture adequate volume Performed at [facility], [address] [place], [place] [address]    Culture  Setup Time   Final    GRAM NEGATIVE RODS IN BOTH AEROBIC AND ANAEROBIC BOTTLES CRITICAL RESULT CALLED TO, READ BACK BY AND VERIFIED WITH: M.BELL,PHARMD 2337 08/04/2018 M.CAMPBELL Performed at [facility], [place] [address] [place], [place] [address]    Culture ESCHERICHIA COLI (A)  Final   Report Status 08/06/2018 FINAL  Final   Organism ID, Bacteria ESCHERICHIA COLI  Final      Susceptibility   Escherichia  coli - MIC*    AMPICILLIN 8 SENSITIVE Sensitive     CEFAZOLIN <=4 SENSITIVE Sensitive     CEFEPIME <=1 SENSITIVE Sensitive     CEFTAZIDIME <=1 SENSITIVE Sensitive     CEFTRIAXONE <=1 SENSITIVE Sensitive     CIPROFLOXACIN <=0.25 SENSITIVE Sensitive     GENTAMICIN <=1 SENSITIVE Sensitive     IMIPENEM <=0.25 SENSITIVE Sensitive     TRIMETH/SULFA <=20 SENSITIVE Sensitive     AMPICILLIN/SULBACTAM 4 SENSITIVE Sensitive     PIP/TAZO <=4 SENSITIVE Sensitive     Extended ESBL NEGATIVE  Sensitive     * ESCHERICHIA COLI  Blood Culture ID Panel (Reflexed)     Status: Abnormal   Collection Time: 08/04/18 12:43 AM  Result Value Ref Range Status   Enterococcus species NOT DETECTED NOT DETECTED Final   Listeria monocytogenes NOT DETECTED NOT DETECTED Final   Staphylococcus species NOT DETECTED NOT DETECTED Final   Staphylococcus aureus (BCID) NOT DETECTED NOT DETECTED Final   Streptococcus species NOT DETECTED NOT DETECTED Final   Streptococcus agalactiae NOT DETECTED NOT DETECTED Final   Streptococcus pneumoniae NOT DETECTED NOT DETECTED Final   Streptococcus pyogenes NOT DETECTED NOT DETECTED Final   Acinetobacter baumannii NOT DETECTED NOT DETECTED Final   Enterobacteriaceae species DETECTED (A) NOT DETECTED Final    Comment: Enterobacteriaceae represent a large family of gram-negative bacteria, not a single organism. CRITICAL RESULT CALLED TO, READ BACK BY AND VERIFIED WITH: M.BELL,PHARMD 2337 08/04/2018 M.CAMPBELL    Enterobacter cloacae complex NOT DETECTED NOT DETECTED Final   Escherichia coli DETECTED (A) NOT DETECTED Final    Comment: CRITICAL RESULT CALLED TO, READ BACK BY AND VERIFIED WITH: M.BELL,PHARMD 2337 08/04/2018 M.CAMPBELL    Klebsiella oxytoca NOT DETECTED NOT DETECTED Final   Klebsiella pneumoniae NOT DETECTED NOT DETECTED Final   Proteus species NOT DETECTED NOT DETECTED Final   Serratia marcescens NOT DETECTED NOT DETECTED Final   Carbapenem resistance NOT DETECTED NOT  DETECTED Final   Haemophilus influenzae NOT DETECTED NOT DETECTED Final   Neisseria meningitidis NOT DETECTED NOT DETECTED Final   Pseudomonas aeruginosa NOT DETECTED NOT DETECTED Final   Candida albicans NOT DETECTED NOT DETECTED Final   Candida glabrata NOT DETECTED NOT DETECTED Final   Candida krusei NOT DETECTED NOT DETECTED Final   Candida parapsilosis NOT DETECTED NOT DETECTED Final   Candida tropicalis NOT DETECTED NOT DETECTED Final    Comment: Performed at [facility], [place] [address], [place], [place] [address]  Culture, blood (Routine X 2) w Reflex to ID Panel     Status: Abnormal   Collection Time: 08/04/18 12:50 AM   Specimen: BLOOD  Result Value Ref Range Status   Specimen Description   Final    BLOOD LEFT ANTECUBITAL Performed at [facility], [address] [place], [place] [address]    Special Requests   Final    BOTTLES DRAWN AEROBIC AND ANAEROBIC Blood Culture adequate volume Performed at [facility], [address] [place], [place] [address]    Culture  Setup Time   Final    GRAM NEGATIVE RODS IN BOTH AEROBIC AND ANAEROBIC BOTTLES CRITICAL VALUE NOTED.  VALUE IS CONSISTENT WITH PREVIOUSLY REPORTED AND CALLED VALUE. Performed at [facility], [place] [address], [place], [place] [address]    Culture ESCHERICHIA COLI (A)  Final   Report Status 08/06/2018 FINAL  Final  SARS Coronavirus 2 (CEPHEID - Performed in [facility]), Hosp Order     Status: None   Collection Time: 08/04/18  6:25 AM   Specimen: Nasopharyngeal Swab  Result Value Ref Range Status   SARS Coronavirus 2 NEGATIVE NEGATIVE Final    Comment: (NOTE) If result is NEGATIVE SARS-CoV-2 target nucleic acids are NOT DETECTED. The SARS-CoV-2 RNA is generally detectable in upper and lower  respiratory specimens during the acute phase of infection. The lowest  concentration of SARS-CoV-2 viral copies this assay can detect is 250  copies / mL. A negative  result does not preclude SARS-CoV-2 infection  and should  not be used as the sole basis for treatment or other  patient management decisions.  A negative result may occur with  improper specimen collection / handling, submission of specimen other  than nasopharyngeal swab, presence of viral mutation(s) within the  areas targeted by this assay, and inadequate number of viral copies  (<250 copies / mL). A negative result must be combined with clinical  observations, patient history, and epidemiological information. If result is POSITIVE SARS-CoV-2 target nucleic acids are DETECTED. The SARS-CoV-2 RNA is generally detectable in upper and lower  respiratory specimens dur ing the acute phase of infection.  Positive  results are indicative of active infection with SARS-CoV-2.  Clinical  correlation with patient history and other diagnostic information is  necessary to determine patient infection status.  Positive results do  not rule out bacterial infection or co-infection with other viruses. If result is PRESUMPTIVE POSTIVE SARS-CoV-2 nucleic acids MAY BE PRESENT.   A presumptive positive result was obtained on the submitted specimen  and confirmed on repeat testing.  While 2019 novel coronavirus  (SARS-CoV-2) nucleic acids may be present in the submitted sample  additional confirmatory testing may be necessary for epidemiological  and / or clinical management purposes  to differentiate between  SARS-CoV-2 and other Sarbecovirus currently known to infect humans.  If clinically indicated additional testing with an alternate test  methodology [phone]) is advised. The SARS-CoV-2 RNA is generally  detectable in upper and lower respiratory sp ecimens during the acute  phase of infection. The expected result is Negative. Fact Sheet for Patients:  StrictlyIdeas.no Fact Sheet for Healthcare Providers: BankingDealers.co.za This test is not yet  approved or cleared by the [place] FDA and has been authorized for detection and/or diagnosis of SARS-CoV-2 by FDA under an Emergency Use Authorization (EUA).  This EUA will remain in effect (meaning this test can be used) for the duration of the COVID-19 declaration under Section 564(b)(1) of the Act, 21 U.S.C. section 360bbb-3(b)(1), unless the authorization is terminated or revoked sooner. Performed at [facility], [place] [address] [place], [place] [address]   Surgical PCR screen     Status: None   Collection Time: 08/06/18  7:42 AM   Specimen: Nasal Mucosa; Nasal Swab  Result Value Ref Range Status   MRSA, PCR NEGATIVE NEGATIVE Final   Staphylococcus aureus NEGATIVE NEGATIVE Final    Comment: (NOTE) The Xpert SA Assay (FDA approved for NASAL specimens in patients 22 years of age and older), is one component of a comprehensive surveillance program. It is not intended to diagnose infection nor to guide or monitor treatment. Performed at [facility], [place] [address], [place], [place] [address]   SARS Coronavirus 2 (Hosp order,Performed in [facility] lab via Abbott ID)     Status: None   Collection Time: 08/11/18 11:10 AM   Specimen: Dry Nasal Swab (Abbott ID Now)  Result Value Ref Range Status   SARS Coronavirus 2 (Abbott ID Now) NEGATIVE NEGATIVE Final    Comment: (NOTE) Interpretive Result Comment(s): COVID 19 Positive SARS CoV 2 target nucleic acids are DETECTED. The SARS CoV 2 RNA is generally detectable in upper and lower respiratory specimens during the acute phase of infection.  Positive results are indicative of active infection with SARS CoV 2.  Clinical correlation with patient history and other diagnostic information is necessary to determine patient infection status.  Positive results do not rule out bacterial infection or coinfection with other viruses. The expected result  is Negative. COVID 19 Negative SARS CoV 2  target nucleic acids are NOT DETECTED. The SARS CoV 2 RNA is generally detectable in upper and lower respiratory specimens during the acute phase of infection.  Negative results do not preclude SARS CoV 2 infection, do not rule out coinfections with other pathogens, and should not be used as the sole basis for treatment or other patient management decisions.  Negative results must be combined with clinical  observations, patient history, and epidemiological information. The expected result is Negative. Invalid Presence or absence of SARS CoV 2 nucleic acids cannot be determined. Repeat testing was performed on the submitted specimen and repeated Invalid results were obtained.  If clinically indicated, additional testing on a new specimen with an alternate test methodology [phone]) is advised.  The SARS CoV 2 RNA is generally detectable in upper and lower respiratory specimens during the acute phase of infection. The expected result is Negative. Fact Sheet for Patients:  GolfingFamily.no Fact Sheet for Healthcare Providers: https://www.hernandez-brewer.com/ This test is not yet approved or cleared by the [place] FDA and has been authorized for detection and/or diagnosis of SARS CoV 2 by FDA under an Emergency Use Authorization (EUA).  This EUA will remain in effect (meaning this test can be used) for the duration of the COVID19 d eclaration under Section 564(b)(1) of the Act, 21 U.S.C. section [phone] 3(b)(1), unless the authorization is terminated or revoked sooner. Performed at [facility], [address] [place], [place] [address]   Respiratory Panel by PCR     Status: None   Collection Time: 08/11/18  4:30 PM   Specimen: Nasopharyngeal Swab; Respiratory  Result Value Ref Range Status   Adenovirus NOT DETECTED NOT DETECTED Final   Coronavirus 229E NOT DETECTED NOT DETECTED Final    Comment: (NOTE) The Coronavirus on the  Respiratory Panel, DOES NOT test for the novel  Coronavirus (2019 nCoV)    Coronavirus HKU1 NOT DETECTED NOT DETECTED Final   Coronavirus NL63 NOT DETECTED NOT DETECTED Final   Coronavirus OC43 NOT DETECTED NOT DETECTED Final   Metapneumovirus NOT DETECTED NOT DETECTED Final   Rhinovirus / Enterovirus NOT DETECTED NOT DETECTED Final   Influenza A NOT DETECTED NOT DETECTED Final   Influenza B NOT DETECTED NOT DETECTED Final   Parainfluenza Virus 1 NOT DETECTED NOT DETECTED Final   Parainfluenza Virus 2 NOT DETECTED NOT DETECTED Final   Parainfluenza Virus 3 NOT DETECTED NOT DETECTED Final   Parainfluenza Virus 4 NOT DETECTED NOT DETECTED Final   Respiratory Syncytial Virus NOT DETECTED NOT DETECTED Final   Bordetella pertussis NOT DETECTED NOT DETECTED Final   Chlamydophila pneumoniae NOT DETECTED NOT DETECTED Final   Mycoplasma pneumoniae NOT DETECTED NOT DETECTED Final    Comment: Performed at [facility], [place], [place], [place] [address]  Culture, blood (routine x 2)     Status: None (Preliminary result)   Collection Time: 08/11/18  5:05 PM   Specimen: Right Antecubital; Blood  Result Value Ref Range Status   Specimen Description   Final    RIGHT ANTECUBITAL Performed at [place] [address] [place], [place] [address]    Special Requests   Final    BOTTLES DRAWN AEROBIC ONLY Blood Culture adequate volume Performed at [place] [address] [place], [place] [address]    Culture   Final    NO GROWTH 2 DAYS Performed at [facility], [address] [place], [place] [address]    Report Status PENDING  Incomplete  Culture, blood (routine x 2)     Status: None (Preliminary result)   Collection Time: 08/11/18  5:05 PM   Specimen: BLOOD  Result Value Ref Range Status   Specimen Description   Final    BLOOD RIGHT ARM Performed at [place] [address], [place], [place] [address]     Special Requests   Final    BOTTLES DRAWN AEROBIC ONLY Blood Culture adequate volume Performed at [facility] [address] [place], [place] [address]    Culture   Final    NO GROWTH 2 DAYS Performed at [facility] [address] [place], [place] [address]    Report Status PENDING  Incomplete  SARS Coronavirus 2 (CEPHEID - Performed in [place] hospital lab), Hosp Order     Status: None   Collection Time: 08/12/18  9:06 AM   Specimen: Nasopharyngeal Swab  Result Value Ref Range Status   SARS Coronavirus 2 NEGATIVE NEGATIVE Final    Comment: (NOTE) If result is NEGATIVE SARS-CoV-2 target nucleic acids are NOT DETECTED. The SARS-CoV-2 RNA is generally detectable in upper and lower  respiratory specimens during the acute phase of infection. The lowest  concentration of SARS-CoV-2 viral copies this assay can detect is 250  copies / mL. A negative result does not preclude SARS-CoV-2 infection  and should not be used as the sole basis for treatment or other  patient management decisions.  A negative result may occur with  improper specimen collection / handling, submission of specimen other  than nasopharyngeal swab, presence of viral mutation(s) within the  areas targeted by this assay, and inadequate number of viral copies  (<250 copies / mL). A negative result must be combined with clinical  observations, patient history, and epidemiological information. If result is POSITIVE SARS-CoV-2 target nucleic acids are DETECTED. The SARS-CoV-2 RNA is generally detectable in upper and lower  respiratory specimens dur ing the acute phase of infection.  Positive  results are indicative of active infection with SARS-CoV-2.  Clinical  correlation with patient history and other diagnostic information is  necessary to determine patient infection status.  Positive results do  not rule out bacterial infection or co-infection with other viruses. If result is  PRESUMPTIVE POSTIVE SARS-CoV-2 nucleic acids MAY BE PRESENT.   A presumptive positive result was obtained on the submitted specimen  and confirmed on repeat testing.  While 2019 novel coronavirus  (SARS-CoV-2) nucleic acids may be present in the submitted sample  additional confirmatory testing may be necessary for epidemiological  and / or clinical management purposes  to differentiate between  SARS-CoV-2 and other Sarbecovirus currently known to infect humans.  If clinically indicated additional testing with an alternate test  methodology [phone]) is advised. The SARS-CoV-2 RNA is generally  detectable in upper and lower respiratory sp ecimens during the acute  phase of infection. The expected result is Negative. Fact Sheet for Patients:  StrictlyIdeas.no Fact Sheet for Healthcare Providers: BankingDealers.co.za This test is not yet approved or cleared by the [place] FDA and has been authorized for detection and/or diagnosis of SARS-CoV-2 by FDA under an Emergency Use Authorization (EUA).  This EUA will remain in effect (meaning this test can be used) for the duration of the COVID-19 declaration under Section 564(b)(1) of the Act, 21 U.S.C. section 360bbb-3(b)(1), unless the authorization is terminated or revoked sooner. Performed at  [facility], [place] [address] [place], [place] [address]       Studies: No results found.  Scheduled Meds: . allopurinol  300 mg Oral Daily  . apixaban  5 mg Oral BID  . diltiazem  240 mg Oral Daily  . insulin aspart  0-15 Units Subcutaneous TID WC  . insulin aspart  0-5 Units Subcutaneous QHS  . magnesium oxide  200 mg Oral Daily  . pantoprazole  40 mg Oral Daily  . simvastatin  10 mg Oral Daily  . vitamin B-12  1,000 mcg Oral Daily    Continuous Infusions: . sodium chloride Stopped (08/11/18 1211)  . sodium chloride 75 mL/hr at 08/13/18 1014  .  piperacillin-tazobactam (ZOSYN)  IV 3.375 g (08/13/18 1014)     LOS: 2 days     Alma Friendly, MD [facility]  If 7PM-7AM, please contact night-coverage [email or web address] 08/13/2018, 4:15 PM

## 2018-08-13 NOTE — Patient Outreach (Signed)
  [facility]) Care Management Chronic Special Needs Program    08/13/2018  Name: KADDEN OSTERHOUT, DOB: November 04, 1940  MRN: [id number]   Mr. Saleem Coccia is enrolled in a chronic special needs plan.  Admitted to hospital on 08/11/2018 with diagnosis of healthcare acquired pneumonia. Individualized care plan sent to utilization management department.  Thea Silversmith, RN, MSN, [place] [phone]

## 2018-08-13 NOTE — Progress Notes (Signed)
PT Cancellation Note  Patient Details Name: Derek Curry MRN: [id number] DOB: 1940-07-07   Cancelled Treatment:    Reason Eval/Treat Not Completed: PT screened, no needs identified, will sign off. Spoke with pt who reported he walked 2 laps around the unit with nursing supervision. He denies need for PT services. Will sign off  Weston Anna, PT Acute Rehabilitation Services Pager: [phone] Office: [phone]

## 2018-08-14 LAB — BASIC METABOLIC PANEL
Anion gap: 8 (ref 5–15)
BUN: 13 mg/dL (ref 8–23)
CO2: 25 mmol/L (ref 22–32)
Calcium: 9.1 mg/dL (ref 8.9–10.3)
Chloride: 102 mmol/L (ref 98–111)
Creatinine, Ser: 1.39 mg/dL — ABNORMAL HIGH (ref 0.61–1.24)
GFR calc Af Amer: 56 mL/min — ABNORMAL LOW (ref >60)
GFR calc non Af Amer: 49 mL/min — ABNORMAL LOW (ref >60)
Glucose, Bld: 122 mg/dL — ABNORMAL HIGH (ref 70–99)
Potassium: 4.1 mmol/L (ref 3.5–5.1)
Sodium: 135 mmol/L (ref 135–145)

## 2018-08-14 LAB — GLUCOSE, CAPILLARY
Glucose-Capillary: 161 mg/dL — ABNORMAL HIGH (ref 70–99)
Glucose-Capillary: 95 mg/dL (ref 70–99)
Glucose-Capillary: 103 mg/dL — ABNORMAL HIGH (ref 70–99)
Glucose-Capillary: 114 mg/dL — ABNORMAL HIGH (ref 70–99)

## 2018-08-14 NOTE — Progress Notes (Signed)
Derek NOTE  DONTRAIL BLACKWELL GEX:[id number] DOB: 09/22/1940 DOA: 08/11/2018 PCP: Jefm Petty, Derek  HPI/Recap of past 24 hours: HPI from Dr Greggory Keen is a 78 y.o. male with medical history significant of type 2 diabetes Curry, Derek Curry, Derek Curry, Derek atrial fibrillation and recent hospitalization for cholelithiasis and elevated liver enzymes status post laparoscopic cholecystectomy on 08/06/2018 presented to ER with a complaint of shortness of breath.  Patient was discharged from the hospital on 08/09/2018 after staying here for 5 days where he was diagnosed with acute cholangitis, acute cholelithiasis status post cholecystectomy and gram-negative bacteremia and was discharged on oral Augmentin.  According to patient, he woke up with shortness of breath and felt hot as well as sweaty.  He did not check his temperature.  He also had some nonproductive cough.  No chills, dizziness, any other complaint.  He has been home since discharge so there is no sick contact or any recent travel. In the ED ([facility]), pt was hemodynamically stable and then he dropped his blood pressure little bit with the lowest being 97/53.  He received some IV fluids.  CBC and CMP were within normal limits.  CT angiogram of the chest was done which ruled out PE however it showed bilateral small pleural effusion with atelectasis and possible consolidation.  Patient was diagnosed with healthcare associated pneumonia and he was given a dose of cefepime and vancomycin and transferred to [facility] for admission. Patient tested negative for COVID-19.  He was not hypoxic.  CT also showed right upper quadrant abdominal postcholecystectomy changes/fluid.    Today, patient denies any new complaints.  Patient still needs IV fluids    Assessment/Plan: Active Problems:   Type 2 diabetes Curry (HCC)   Derek A-fib (HCC)   Essential Derek Curry   Gastroesophageal reflux  disease without esophagitis   Morbid obesity ([place])   HCAP (healthcare-associated pneumonia)   ?HCAP Afebrile, no leukocytosis Respiratory viral panel negative Strep pneumo negative, urine Legionella negative COVID test done negative BC x2 NGTD CTA chest showed small pleural effusions bilaterally with compressive atelectasis in the lung base regions, early pneumonia superimposed with atelectasis in the right base cannot be entirely excluded Continue IV Zosyn Supplemental O2 prn, duoneb  Recent E. coli bacteremia Repeat blood cultures NGTD CTA chest: Showed some fluid and air collection within the gallbladder fossa region which may be secondary to recent cholecystectomy.  Developing abscess in this area cannot be excluded advised clinical correlation.  Repeat imaging may be needed if patient shows symptoms of infection. Continue IV Zosyn  AKI Creatinine rising ?  Contrast induced (CTA chest on 6/27) Continue IV fluid BMP in the a.m.  Derek Curry BP currently soft Hold home lisinopril, HCTZ  Derek A. Fib Rate controlled Continue diltiazem, Eliquis  Diabetes Curry type 2 A1c on 08/11/2018 showed 6.9 Not on any meds at home Continue SSI, hypoglycemic protocol, Accu-Cheks Follow-up with PCP, may need to start ? Metformin at a later time after discharge (pt just had IV contrast)  Macrocytic anemia Vitamin B12 348, continue supplementation Folate 8.9 Monitor closely, daily CBC  Derek Curry Continue PPI         Malnutrition Type:      Malnutrition Characteristics:      Nutrition Interventions:       Estimated body mass index is 26.31 kg/m as calculated from the following:   Height as of this encounter: 5\' 8"  (1.727 m).   Weight as of this encounter: 78.5  kg.     Code Status: Full  Family Communication: None at bedside  Disposition Plan: Home likely on 08/15/2018, pending creatinine improvement   Consultants:  None  Procedures:  None   Antimicrobials:  IV Zosyn  DVT prophylaxis: Eliquis   Objective: Vitals:   08/13/18 1545 08/13/18 2018 08/14/18 0502 08/14/18 1344  BP:  113/66 113/66 103/65  Pulse:  63 61 65  Resp:  18 17 17   Temp:  98.4 F (36.9 C) 98.3 F (36.8 C) 98.2 F (36.8 C)  TempSrc:  Oral Oral Oral  SpO2: 98% 99% 100% 100%  Weight:      Height:        Intake/Output Summary (Last 24 hours) at 08/14/2018 1350 Last data filed at 08/14/2018 0900 Gross per 24 hour  Intake 2214.4 ml  Output 1775 ml  Net 439.4 ml   Filed Weights   08/11/18 0705  Weight: 78.5 kg    Exam:  General: NAD   Cardiovascular: S1, S2 present  Respiratory: CTAB  Abdomen: Soft, nontender, nondistended, bowel sounds present  Musculoskeletal: No bilateral pedal edema noted  Skin: Normal  Psychiatry: Normal mood   Data Reviewed: CBC: Recent Labs  Lab 08/08/18 0029 08/09/18 0427 08/11/18 0734 08/12/18 0351 08/13/18 0356  WBC 11.6* 9.0 8.6 7.0 6.9  NEUTROABS 10.6*  --  5.7 4.9 4.7  HGB 11.8* 10.4* 10.9* 9.8* 9.6*  HCT 36.5* 31.6* 33.2* 30.7* 30.3*  MCV 103.7* 103.6* 102.8* 105.1* 105.6*  PLT 175 182 228 238 374   Basic Metabolic Panel: Recent Labs  Lab 08/08/18 0029 08/09/18 0427 08/11/18 0734 08/12/18 0351 08/13/18 0356 08/14/18 0422  NA 135 135 136 137 136 135  K 5.1 4.3 3.8 3.9 4.0 4.1  CL 106 109 102 102 102 102  CO2 17* 21* 22 25 25 25   GLUCOSE 156* 146* 123* 119* 116* 122*  BUN 19 25* 17 15 14 13   CREATININE 1.09 1.05 1.04 0.99 1.30* 1.39*  CALCIUM 9.1 9.1 9.2 9.0 8.9 9.1  MG 1.8  --   --   --   --   --   PHOS 3.4  --   --   --   --   --    GFR: Estimated Creatinine Clearance: 43.1 mL/min (A) (by C-G formula based on SCr of 1.39 mg/dL (H)). Liver Function Tests: Recent Labs  Lab 08/08/18 0029 08/11/18 0734 08/12/18 0351  AST 47* 14* 27  ALT 58* 30 48*  ALKPHOS 117 109 131*  BILITOT 1.4* 1.1 1.2  PROT 6.9 6.8 6.5  ALBUMIN 2.6* 3.0* 2.6*   Recent Labs  Lab 08/11/18 0734   LIPASE 21   No results for input(s): AMMONIA in the last 168 hours. Coagulation Profile: No results for input(s): INR, PROTIME in the last 168 hours. Cardiac Enzymes: No results for input(s): CKTOTAL, CKMB, CKMBINDEX, TROPONINI in the last 168 hours. BNP (last 3 results) No results for input(s): PROBNP in the last 8760 hours. HbA1C: Recent Labs    08/11/18 1706  HGBA1C 6.9*   CBG: Recent Labs  Lab 08/13/18 1201 08/13/18 1627 08/13/18 2143 08/14/18 0731 08/14/18 1133  GLUCAP 132* 117* 111* 161* 95   Lipid Profile: No results for input(s): CHOL, HDL, LDLCALC, TRIG, CHOLHDL, LDLDIRECT in the last 72 hours. Thyroid Function Tests: No results for input(s): TSH, T4TOTAL, FREET4, T3FREE, THYROIDAB in the last 72 hours. Anemia Panel: No results for input(s): VITAMINB12, FOLATE, FERRITIN, TIBC, IRON, RETICCTPCT in the last 72 hours. Urine analysis:  Component Value Date/Time   COLORURINE YELLOW 08/11/2018 0730   APPEARANCEUR CLEAR 08/11/2018 0730   LABSPEC 1.020 08/11/2018 0730   PHURINE 6.0 08/11/2018 0730   GLUCOSEU NEGATIVE 08/11/2018 0730   HGBUR NEGATIVE 08/11/2018 0730   BILIRUBINUR NEGATIVE 08/11/2018 0730   KETONESUR NEGATIVE 08/11/2018 0730   PROTEINUR NEGATIVE 08/11/2018 0730   NITRITE NEGATIVE 08/11/2018 0730   LEUKOCYTESUR NEGATIVE 08/11/2018 0730   Sepsis Labs: @LABRCNTIP (procalcitonin:4,lacticidven:4)  ) Recent Results (from the past 240 hour(s))  Surgical PCR screen     Status: None   Collection Time: 08/06/18  7:42 AM   Specimen: Nasal Mucosa; Nasal Swab  Result Value Ref Range Status   MRSA, PCR NEGATIVE NEGATIVE Final   Staphylococcus aureus NEGATIVE NEGATIVE Final    Comment: (NOTE) The Xpert SA Assay (FDA approved for NASAL specimens in patients 30 years of age and older), is one component of a comprehensive surveillance program. It is not intended to diagnose infection nor to guide or monitor treatment. Performed at [facility], [place] [address], [place], [place] [address]   SARS Coronavirus 2 (Hosp order,Performed in [facility] lab via Abbott ID)     Status: None   Collection Time: 08/11/18 11:10 AM   Specimen: Dry Nasal Swab (Abbott ID Now)  Result Value Ref Range Status   SARS Coronavirus 2 (Abbott ID Now) NEGATIVE NEGATIVE Final    Comment: (NOTE) Interpretive Result Comment(s): COVID 19 Positive SARS CoV 2 target nucleic acids are DETECTED. The SARS CoV 2 RNA is generally detectable in upper and lower respiratory specimens during the acute phase of infection.  Positive results are indicative of active infection with SARS CoV 2.  Clinical correlation with patient history and other diagnostic information is necessary to determine patient infection status.  Positive results do not rule out bacterial infection or coinfection with other viruses. The expected result is Negative. COVID 19 Negative SARS CoV 2 target nucleic acids are NOT DETECTED. The SARS CoV 2 RNA is generally detectable in upper and lower respiratory specimens during the acute phase of infection.  Negative results do not preclude SARS CoV 2 infection, do not rule out coinfections with other pathogens, and should not be used as the sole basis for treatment or other patient management decisions.  Negative results must be combined with clinical  observations, patient history, and epidemiological information. The expected result is Negative. Invalid Presence or absence of SARS CoV 2 nucleic acids cannot be determined. Repeat testing was performed on the submitted specimen and repeated Invalid results were obtained.  If clinically indicated, additional testing on a new specimen with an alternate test methodology [phone]) is advised.  The SARS CoV 2 RNA is generally detectable in upper and lower respiratory specimens during the acute phase of infection. The expected result is Negative. Fact Sheet for Patients:   GolfingFamily.no Fact Sheet for Healthcare Providers: https://www.hernandez-brewer.com/ This test is not yet approved or cleared by the [place] FDA and has been authorized for detection and/or diagnosis of SARS CoV 2 by FDA under an Emergency Use Authorization (EUA).  This EUA will remain in effect (meaning this test can be used) for the duration of the COVID19 d eclaration under Section 564(b)(1) of the Act, 21 U.S.C. section 224 144 6823 3(b)(1), unless the authorization is terminated or revoked sooner. Performed at [facility], [address] [place], [place] [address]   Respiratory Panel by PCR     Status: None   Collection Time: 08/11/18  4:30 PM   Specimen: Nasopharyngeal Swab; Respiratory  Result Value Ref Range Status   Adenovirus NOT DETECTED NOT DETECTED Final   Coronavirus 229E NOT DETECTED NOT DETECTED Final    Comment: (NOTE) The Coronavirus on the Respiratory Panel, DOES NOT test for the novel  Coronavirus (2019 nCoV)    Coronavirus HKU1 NOT DETECTED NOT DETECTED Final   Coronavirus NL63 NOT DETECTED NOT DETECTED Final   Coronavirus OC43 NOT DETECTED NOT DETECTED Final   Metapneumovirus NOT DETECTED NOT DETECTED Final   Rhinovirus / Enterovirus NOT DETECTED NOT DETECTED Final   Influenza A NOT DETECTED NOT DETECTED Final   Influenza B NOT DETECTED NOT DETECTED Final   Parainfluenza Virus 1 NOT DETECTED NOT DETECTED Final   Parainfluenza Virus 2 NOT DETECTED NOT DETECTED Final   Parainfluenza Virus 3 NOT DETECTED NOT DETECTED Final   Parainfluenza Virus 4 NOT DETECTED NOT DETECTED Final   Respiratory Syncytial Virus NOT DETECTED NOT DETECTED Final   Bordetella pertussis NOT DETECTED NOT DETECTED Final   Chlamydophila pneumoniae NOT DETECTED NOT DETECTED Final   Mycoplasma pneumoniae NOT DETECTED NOT DETECTED Final    Comment: Performed at [facility], [place] [address] [place], [place] [address]  Culture, blood  (routine x 2)     Status: None (Preliminary result)   Collection Time: 08/11/18  5:05 PM   Specimen: Right Antecubital; Blood  Result Value Ref Range Status   Specimen Description   Final    RIGHT ANTECUBITAL Performed at [place] [address], [place], [place] [address]    Special Requests   Final    BOTTLES DRAWN AEROBIC ONLY Blood Culture adequate volume Performed at [place] [address] [place], [place] [address]    Culture   Final    NO GROWTH 3 DAYS Performed at [facility], [place] [address], [place], [place] [address]    Report Status PENDING  Incomplete  Culture, blood (routine x 2)     Status: None (Preliminary result)   Collection Time: 08/11/18  5:05 PM   Specimen: BLOOD  Result Value Ref Range Status   Specimen Description   Final    BLOOD RIGHT ARM Performed at [place] [address] [place], [place] [address]    Special Requests   Final    BOTTLES DRAWN AEROBIC ONLY Blood Culture adequate volume Performed at [place] [address] [place], [place] [address]    Culture   Final    NO GROWTH 3 DAYS Performed at [facility], [place] [address], [place],  [address]    Report Status PENDING  Incomplete  SARS Coronavirus 2 (CEPHEID - Performed in [place] hospital lab), Hosp Order     Status: None   Collection Time: 08/12/18  9:06 AM   Specimen: Nasopharyngeal Swab  Result Value Ref Range Status   SARS Coronavirus 2 NEGATIVE NEGATIVE Final    Comment: (NOTE) If result is NEGATIVE SARS-CoV-2 target nucleic acids are NOT DETECTED. The SARS-CoV-2 RNA is generally detectable in upper and lower  respiratory specimens during the acute phase of infection. The lowest  concentration of SARS-CoV-2 viral copies this assay can detect is 250  copies / mL. A negative result does not preclude SARS-CoV-2 infection  and should not be used as the sole basis  for treatment or other  patient management decisions.  A negative result may occur with  improper specimen collection /  handling, submission of specimen other  than nasopharyngeal swab, presence of viral mutation(s) within the  areas targeted by this assay, and inadequate number of viral copies  (<250 copies / mL). A negative result must be combined with clinical  observations, patient history, and epidemiological information. If result is POSITIVE SARS-CoV-2 target nucleic acids are DETECTED. The SARS-CoV-2 RNA is generally detectable in upper and lower  respiratory specimens dur ing the acute phase of infection.  Positive  results are indicative of active infection with SARS-CoV-2.  Clinical  correlation with patient history and other diagnostic information is  necessary to determine patient infection status.  Positive results do  not rule out bacterial infection or co-infection with other viruses. If result is PRESUMPTIVE POSTIVE SARS-CoV-2 nucleic acids MAY BE PRESENT.   A presumptive positive result was obtained on the submitted specimen  and confirmed on repeat testing.  While 2019 novel coronavirus  (SARS-CoV-2) nucleic acids may be present in the submitted sample  additional confirmatory testing may be necessary for epidemiological  and / or clinical management purposes  to differentiate between  SARS-CoV-2 and other Sarbecovirus currently known to infect humans.  If clinically indicated additional testing with an alternate test  methodology [phone]) is advised. The SARS-CoV-2 RNA is generally  detectable in upper and lower respiratory sp ecimens during the acute  phase of infection. The expected result is Negative. Fact Sheet for Patients:  StrictlyIdeas.no Fact Sheet for Healthcare Providers: BankingDealers.co.za This test is not yet approved or cleared by the [place] FDA and has been authorized for detection and/or  diagnosis of SARS-CoV-2 by FDA under an Emergency Use Authorization (EUA).  This EUA will remain in effect (meaning this test can be used) for the duration of the COVID-19 declaration under Section 564(b)(1) of the Act, 21 U.S.C. section 360bbb-3(b)(1), unless the authorization is terminated or revoked sooner. Performed at [facility], [place] [address], [place], [place] [address]       Studies: No results found.  Scheduled Meds: . allopurinol  300 mg Oral Daily  . apixaban  5 mg Oral BID  . diltiazem  240 mg Oral Daily  . insulin aspart  0-15 Units Subcutaneous TID WC  . insulin aspart  0-5 Units Subcutaneous QHS  . magnesium oxide  200 mg Oral Daily  . pantoprazole  40 mg Oral Daily  . simvastatin  10 mg Oral Daily  . vitamin B-12  1,000 mcg Oral Daily    Continuous Infusions: . sodium chloride Stopped (08/11/18 1211)  . sodium chloride 100 mL/hr at 08/14/18 0903  . piperacillin-tazobactam (ZOSYN)  IV 3.375 g (08/14/18 0902)     LOS: 3 days     Alma Friendly, Derek [facility]  If 7PM-7AM, please contact night-coverage [email or web address] 08/14/2018, 1:50 PM

## 2018-08-15 LAB — CBC WITH DIFFERENTIAL/PLATELET
Abs Immature Granulocytes: 0.03 10*3/uL (ref 0.00–0.07)
Basophils Absolute: 0 10*3/uL (ref 0.0–0.1)
Basophils Relative: 0 %
Eosinophils Absolute: 0.2 10*3/uL (ref 0.0–0.5)
Eosinophils Relative: 2 %
HCT: 29.5 % — ABNORMAL LOW (ref 39.0–52.0)
Hemoglobin: 9.3 g/dL — ABNORMAL LOW (ref 13.0–17.0)
Immature Granulocytes: 0 %
Lymphocytes Relative: 22 %
Lymphs Abs: 1.6 10*3/uL (ref 0.7–4.0)
MCH: 33.9 pg (ref 26.0–34.0)
MCHC: 31.5 g/dL (ref 30.0–36.0)
MCV: 107.7 fL — ABNORMAL HIGH (ref 80.0–100.0)
Monocytes Absolute: 0.5 10*3/uL (ref 0.1–1.0)
Monocytes Relative: 7 %
Neutro Abs: 4.8 10*3/uL (ref 1.7–7.7)
Neutrophils Relative %: 69 %
Platelets: 297 10*3/uL (ref 150–400)
RBC: 2.74 MIL/uL — ABNORMAL LOW (ref 4.22–5.81)
RDW: 14.1 % (ref 11.5–15.5)
WBC: 7 10*3/uL (ref 4.0–10.5)
nRBC: 0 % (ref 0.0–0.2)

## 2018-08-15 LAB — BASIC METABOLIC PANEL
Anion gap: 6 (ref 5–15)
BUN: 13 mg/dL (ref 8–23)
CO2: 24 mmol/L (ref 22–32)
Calcium: 9 mg/dL (ref 8.9–10.3)
Chloride: 106 mmol/L (ref 98–111)
Creatinine, Ser: 1.15 mg/dL (ref 0.61–1.24)
GFR calc Af Amer: >60 mL/min (ref >60)
GFR calc non Af Amer: >60 mL/min (ref >60)
Glucose, Bld: 103 mg/dL — ABNORMAL HIGH (ref 70–99)
Potassium: 4 mmol/L (ref 3.5–5.1)
Sodium: 136 mmol/L (ref 135–145)

## 2018-08-15 LAB — GLUCOSE, CAPILLARY
Glucose-Capillary: 82 mg/dL (ref 70–99)
Glucose-Capillary: 118 mg/dL — ABNORMAL HIGH (ref 70–99)

## 2018-08-15 MED ORDER — AMOXICILLIN-POT CLAVULANATE 875-125 MG PO TABS: 1.0000 | ORAL_TABLET | Freq: Two times a day (BID) | ORAL | 0 refills | Status: AC

## 2018-08-15 NOTE — Discharge Summary (Signed)
Physician Discharge Summary  Derek Curry XIH:[id number] DOB: 1940/03/13 DOA: 08/11/2018  PCP: Jefm Petty, MD  Admit date: 08/11/2018 Discharge date: 08/15/2018  Time spent: 40 minutes  Recommendations for Outpatient Follow-up:  1. Follow outpatient CBC/CMP 2. Follow outpatient CXR  3. Consider repeat abdominal imaging outpatient (abnormal imaging findings on this CT chest - discussed return precautions with pt and findings) 4. Pt with diabetes, consider starting metformin outpatient  Discharge Diagnoses:  Active Problems:   Type 2 diabetes mellitus (HCC)   Paroxysmal Yanisa Goodgame-fib (HCC)   Essential hypertension   Gastroesophageal reflux disease without esophagitis   Morbid obesity ([place])   HCAP (healthcare-associated pneumonia)   Discharge Condition: stable  Diet recommendation: heart healthy  Filed Weights   08/11/18 0705  Weight: 78.5 kg    History of present illness:  HPI from Dr Manya Silvas D Torrenceis Derek Curry 78 y.o.malewith medical history significant oftype 2 diabetes mellitus, GERD, hypertension, paroxysmal atrial fibrillation and recent hospitalization for cholelithiasis and elevated liver enzymes status post laparoscopic cholecystectomy on 08/06/2018 presented to ER with [name] complaint of shortness of breath. Patient was discharged from the hospital on 08/09/2018 after staying here for 5 days where he was diagnosed with acute cholangitis, acute cholelithiasis status post cholecystectomy and gram-negative bacteremia and was discharged on oral Augmentin. According to patient, he woke up with shortness of breath and felt hot as well as sweaty. He did not check his temperature. He also had some nonproductive cough. No chills, dizziness, any other complaint. He has been home since discharge so there is no sick contact or any recent travel. In the ED ([facility]), pt was hemodynamically stable and then he dropped his blood pressure little bit with the lowest being  97/53. He received some IV fluids. CBC and CMP were within normal limits. CT angiogram of the chest was done which ruled out PE however it showed bilateral small pleural effusion with atelectasis and possible consolidation. Patient was diagnosed with healthcare associated pneumonia and he was given [name] dose of cefepime and vancomycin and transferred to [facility]. Patient tested negative for COVID-19. He was not hypoxic. CT also showed right upper quadrant abdominal postcholecystectomy changes/fluid.  He was admitted with HCAP shortly after an admission for cholecystitis and cholecystectomy.  He's improved with IV abx.    Hospital Course:  ?HCAP Afebrile, no leukocytosis Respiratory viral panel negative Strep pneumo negative, urine Legionella negative COVID test done negative BC x2 NGTD CTA chest showed small pleural effusions bilaterally with compressive atelectasis in the lung base regions, early pneumonia superimposed with atelectasis in the right base cannot be entirely excluded Improved with abx, s/p 5 days of zosyn - d/c on augmentin - pt appears back to his baseline Supplemental O2 prn, duoneb  Recent E. coli bacteremia Repeat blood cultures NGTD CTA chest: Showed some fluid and air collection within the gallbladder fossa region which may be secondary to recent cholecystectomy.  Developing abscess in this area cannot be excluded advised clinical correlation.  Repeat imaging may be needed if patient shows symptoms of infection. Discussed imaging findings with pt and noted he may need repeat imaging in future, no significant pain on exam today or noted in the chart previously.  Recommended pt pay close attention to abdominal sx as he may need repeat imaging if worsened.  AKI Improved today ?  Contrast induced (CTA chest on 6/27) Continue IV fluid BMP in the [name]  Hypertension Resume home meds at discharge  Paroxysmal  [name] Fib Rate controlled Continue  diltiazem, Eliquis  Diabetes mellitus type 2 A1c on 08/11/2018 showed 6.9 Not on any meds at home Continue SSI, hypoglycemic protocol, Accu-Cheks Discuss starting pt on metformin as outpatient  Macrocytic anemia Vitamin B12 348, continue supplementation Folate 8.9 Monitor closely, daily CBC  GERD Continue PPI  Procedures:  none  Consultations:  none  Discharge Exam: Vitals:   08/14/18 2328 08/15/18 0529  BP:  121/84  Pulse: 77 60  Resp: 16 17  Temp:  98.3 F (36.8 C)  SpO2: 100% 100%   Feels well Would like to go home  General: No acute distress. Cardiovascular: Heart sounds show [name] regular rate, and rhythm. Lungs: Clear to auscultation bilaterally  Abdomen: Soft, nontender, nondistended. Laparoscopic incisions, healing well. Neurological: Alert and oriented 3. Moves all extremities 4. Cranial nerves II through XII grossly intact. Skin: Warm and dry. No rashes or lesions. Extremities: No clubbing or cyanosis. No edema.  Psychiatric: Mood and affect are normal. Insight and judgment are appropriate.   Discharge Instructions   Discharge Instructions    Diet - low sodium heart healthy   Complete by: As directed    Discharge instructions   Complete by: As directed    You were seen for pneumonia.  You were treated with IV antibiotics and have improved.  Please follow up with your PCP within Niley Helbig week (before the end of the week if possible).  You need repeat chest x ray as an outpatient.  We've discharged you with an additional 5 days of antibiotics for your pneumonia.  Your CT scan was abnormal and showed possible abscess in the area of the recent surgery.  I think this is unlikely given your improvement on IV antibiotics alone and your abdominal exam was benign.  You should follow this up outpatient closely with your PCP and the surgeons.    Return for new, recurrent, or worsening symptoms.  Please ask your PCP to request records from this  hospitalization so they know what was done and what the next steps will be.   Increase activity slowly   Complete by: As directed      Allergies as of 08/15/2018      Reactions   Benazepril Anaphylaxis   Shellfish Allergy Anaphylaxis, Swelling   Vicodin [hydrocodone-acetaminophen] Other (See Comments)   Mental status changes and dizziness      Medication List    STOP taking these medications   naproxen sodium 220 MG tablet Commonly known as: ALEVE     TAKE these medications   Accu-Chek Guide test strip Generic drug: glucose blood USE TO CHECK BLOOD SUGAR TWICE Dejanay Wamboldt DAY   ONE TOUCH ULTRA TEST test strip Generic drug: glucose blood USE TO CHECK BLOOD SUGAR DAILY   allopurinol 300 MG tablet Commonly known as: ZYLOPRIM Take 300 mg by mouth daily.   aluminum-magnesium hydroxide-simethicone 811-886-77 MG/5ML Susp Commonly known as: MAALOX Take 30 mLs by mouth 4 (four) times daily as needed (heart burn).   amoxicillin-clavulanate 875-125 MG tablet Commonly known as: AUGMENTIN Take 1 tablet by mouth every 12 (twelve) hours for 5 days.   diltiazem 240 MG 24 hr capsule Commonly known as: CARDIZEM CD Take 1 capsule by mouth daily.   Eliquis 5 MG Tabs tablet Generic drug: apixaban Take 5 mg by mouth 2 (two) times daily.   fluticasone 50 MCG/ACT nasal spray Commonly known as: FLONASE Place 1 spray into both nostrils daily as needed for allergies or rhinitis.   lisinopril-hydrochlorothiazide 20-25  MG tablet Commonly known as: ZESTORETIC Take 1 tablet by mouth daily.   Magnesium Oxide 250 MG Tabs Take 250 mg by mouth every evening.   meclizine 12.5 MG tablet Commonly known as: ANTIVERT Take 12.5 mg by mouth 3 (three) times daily as needed for dizziness or nausea.   omeprazole 40 MG capsule Commonly known as: PRILOSEC Take 1 capsule by mouth daily.   ONE TOUCH ULTRA 2 w/Device Kit USE TO CHECK BLOOD SUGAR DAILY   oxyCODONE 5 MG immediate release tablet Commonly  known as: Roxicodone Take 1 tablet (5 mg total) by mouth every 6 (six) hours as needed for breakthrough pain.   valACYclovir 500 MG tablet Commonly known as: VALTREX Take 500 mg by mouth 2 (two) times daily as needed (outbreak).   zolpidem 5 MG tablet Commonly known as: AMBIEN Take 1 tablet by mouth at bedtime.      Allergies  Allergen Reactions  . Benazepril Anaphylaxis  . Shellfish Allergy Anaphylaxis and Swelling  . Vicodin [Hydrocodone-Acetaminophen] Other (See Comments)    Mental status changes and dizziness       The results of significant diagnostics from this hospitalization (including imaging, microbiology, ancillary and laboratory) are listed below for reference.    Significant Diagnostic Studies: Dg Chest 2 View  Result Date: 08/11/2018 CLINICAL DATA:  Recent cholecystectomy. EXAM: CHEST - 2 VIEW COMPARISON:  08/04/2018 FINDINGS: Mild hazy density at the bases with small pleural effusions. Borderline heart size, stable. Dual-chamber pacer leads from the left in unremarkable position. IMPRESSION: Small pleural effusions with mild atelectasis or infection at the bases. Electronically Signed   By: Monte Fantasia M.D.   On: 08/11/2018 07:46   Dg Cholangiogram Operative  Result Date: 08/06/2018 CLINICAL DATA:  Cholecystectomy for cholelithiasis. Abnormal liver function tests. EXAM: INTRAOPERATIVE CHOLANGIOGRAM TECHNIQUE: Cholangiographic images from the C-arm fluoroscopic device were submitted for interpretation post-operatively. Please see the procedural report for the amount of contrast and the fluoroscopy time utilized. COMPARISON:  Ultrasound and CT studies on 08/04/2018 FINDINGS: Intraoperative imaging demonstrates sluggish flow of contrast through the distal CBD with suggestion of probable filling defects in the distal CBD. No contrast is seen to enter the duodenum. No contrast extravasation. IMPRESSION: Intraoperative cholangiogram demonstrates probable filling defects  in the distal CBD and lack of contrast entering the duodenum. Findings likely reflect choledocholithiasis. Electronically Signed   By: Aletta Edouard M.D.   On: 08/06/2018 14:29   Ct Angio Chest Pe W/cm &/or Wo Cm  Result Date: 08/11/2018 CLINICAL DATA:  Shortness of breath. Status post recent cholecystectomy EXAM: CT ANGIOGRAPHY CHEST WITH CONTRAST TECHNIQUE: Multidetector CT imaging of the chest was performed using the standard protocol during bolus administration of intravenous contrast. Multiplanar CT image reconstructions and MIPs were obtained to evaluate the vascular anatomy. CONTRAST:  123m OMNIPAQUE IOHEXOL 350 MG/ML SOLN COMPARISON:  Chest CT April 21, 2015; chest radiograph August 06, 2015 FINDINGS: Cardiovascular: There is no demonstrable pulmonary embolus. There is no thoracic aortic aneurysm or dissection. Visualized great vessels appear unremarkable. Note that the right innominate and left common carotid arteries arise as Day Deery common trunk, an anatomic variant. There is no evident pericardial effusion or pericardial thickening. Pacemaker leads are attached to the right atrium and right ventricle. Mediastinum/Nodes: Thyroid is rather diminutive. Thyroid appears unremarkable. There is no evident thoracic adenopathy. No esophageal lesions are appreciable. Lungs/Pleura: There are small pleural effusions bilaterally with compressive atelectasis in the lung base regions. There may be mild airspace consolidation associated with atelectasis in  the posterior right base. Lungs elsewhere are clear. Upper Abdomen: Gallbladder is absent. There is Amour Trigg focal area of fluid and air in the gallbladder fossa region, incompletely visualized which measures 3.8 x 2.4 cm. This focus of fluid and air may well be due to the recent cholecystectomy. It should be noted that from an imaging standpoint, Klaira Pesci developing abscess in this area cannot be excluded. Visualized upper abdominal structures otherwise appear unremarkable.  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 There are no blastic or lytic bone lesions. No chest wall lesions are evident. Review of the MIP images confirms the above findings. IMPRESSION: 1. No demonstrable pulmonary embolus. No thoracic aortic aneurysm or dissection. 2. Incomplete visualization of fluid and air collection within the gallbladder fossa region. This fluid and air may well be secondary to recent cholecystectomy. From an imaging standpoint, developing abscess in this area cannot be excluded. Close clinical surveillance in this regard advised. Repeat imaging of this area may be warranted if patient show symptoms suggesting infection in this area. 3. Small pleural effusions bilaterally with compressive atelectasis in the lung base regions. Early pneumonia superimposed with atelectasis in the right base cannot be entirely excluded. 4.  No demonstrable thoracic adenopathy. 5. Degenerative change in the thoracic spine with Ginia Rudell degree of diffuse idiopathic skeletal hyperostosis, also present previously. 6.  Pacemaker leads attached to right atrium and right ventricle. Electronically Signed   By: Lowella Grip III M.D.   On: 08/11/2018 09:06   Ct Abdomen Pelvis W Contrast  Result Date: 08/04/2018 CLINICAL DATA:  Diffuse abdominal pain.  Fever. EXAM: CT ABDOMEN AND PELVIS WITH CONTRAST TECHNIQUE: Multidetector CT imaging of the abdomen and pelvis was performed using the standard protocol following bolus administration of intravenous contrast. CONTRAST:  87m OMNIPAQUE IOHEXOL 300 MG/ML  SOLN COMPARISON:  Report from abdominal CT 03/13/2014, images not available. FINDINGS: Lower chest: Linear atelectasis in both lower lobes. Pacemaker partially included. Hepatobiliary: Subcentimeter cyst in the anterior left lobe. Layering hyperdensity in the gallbladder may represent stones or sludge. Suggestion of mild gallbladder wall thickening at 4 mm. No biliary  dilatation. Pancreas: Mild parenchymal atrophy. No ductal dilatation or inflammation. Spleen: Normal in size without focal abnormality. Adrenals/Urinary Tract: No adrenal nodule. No hydronephrosis or perinephric edema. Homogeneous renal enhancement with symmetric excretion on delayed phase imaging. Multiple low-density lesions in the right greater than left kidney are likely cyst, some of which are too small to characterize. Urinary bladder is physiologically distended without wall thickening. Stomach/Bowel: Prominent gastric distention with ingested material. There is normal positioning of the ligament of Treitz, however small bowel than courses in the right abdomen. No associated bowel wall thickening, inflammation, or obstruction. Colon is normally positioned. Appendix surgically absent per history. No pericecal inflammation. Multifocal colonic diverticulosis. No diverticulitis. Mild sigmoid colonic redundancy. Vascular/Lymphatic: Normal caliber abdominal aorta. Portal vein and mesenteric vessels are patent. No adenopathy. Reproductive: Prostate is unremarkable. Other: No free air, free fluid, or intra-abdominal fluid collection. Small fat containing umbilical hernia Musculoskeletal: There are no acute or suspicious osseous abnormalities. Degenerative change in the spine. IMPRESSION: 1. Gallstones with possible gallbladder wall thickening. Recommend right upper quadrant ultrasound for further evaluation. 2. Marked gastric distention with ingested material, raising concern for gastroparesis or delayed gastric emptying. 3. Normal positioning of the ligament of Treitz, however proximal jejunum then courses abnormally into the right abdomen. It is unclear whether this is secondary to displacement related to distended stomach versus partial small bowel malrotation. No  associated bowel inflammation or obstruction. 4. Colonic diverticulosis without diverticulitis. Electronically Signed   By: Keith Rake M.D.   On:  08/04/2018 02:42   Dg Chest Port 1 View  Result Date: 08/04/2018 CLINICAL DATA:  Shortness of breath. Fever. Chills. 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 Unchanged heart size and mediastinal contours. No pulmonary edema, focal airspace disease, pleural effusion or pneumothorax. IMPRESSION: No acute chest finding. Electronically Signed   By: Keith Rake M.D.   On: 08/04/2018 01:33   Dg Ercp Biliary & Pancreatic Ducts  Result Date: 08/07/2018 CLINICAL DATA:  78 year old male with Madi Bonfiglio history of choledocholithiasis EXAM: INTRAOPERATIVE CHOLANGIOGRAM TECHNIQUE: Cholangiographic images from the C-arm fluoroscopic device were submitted for interpretation post-operatively. Please see the procedural report for the amount of contrast and the fluoroscopy time utilized. COMPARISON:  08/06/2018, CT 08/04/2018 FINDINGS: Limited intraoperative fluoroscopic spot images during ERCP. Initial image demonstrates endoscope projecting over the upper abdomen. Surgical changes of cholecystectomy. There is then passage of safety wire into the extrahepatic biliary ducts and partial opacification with retrograde contrast. Multiple rounded filling defects within the extrahepatic biliary system. Incomplete opacification of the ductal system. IMPRESSION: Limited images during ERCP demonstrates partial opacification of the extrahepatic biliary system with small rounded filling defects, potentially air bubbles or stones/debris. Please refer to the dictated operative report for full details of intraoperative findings and procedure. Electronically Signed   By: Corrie Mckusick D.O.   On: 08/07/2018 10:25   US Abdomen Limited Ruq  Result Date: 08/04/2018 CLINICAL DATA:  Ascending cholangitis EXAM: ULTRASOUND ABDOMEN LIMITED RIGHT UPPER QUADRANT COMPARISON:  08/04/2018 CT abdomen/pelvis FINDINGS: Gallbladder: No gallbladder distention. Ring down artifact in  the nondependent gallbladder wall indicative of adenomyomatosis. Layering subcentimeter shadowing gallstones. No sonographic Murphy sign. No pericholecystic fluid. Common bile duct: Diameter: 5 mm Liver: No focal lesion identified. Within normal limits in parenchymal echogenicity. Portal vein is patent on color Doppler imaging with normal direction of blood flow towards the liver. IMPRESSION: 1. Cholelithiasis.  No sonographic evidence of acute cholecystitis. 2. Findings of adenomyomatosis of the gallbladder. 3. No biliary ductal dilatation. 4. Normal liver. Electronically Signed   By: Ilona Sorrel M.D.   On: 08/04/2018 05:25    Microbiology: Recent Results (from the past 240 hour(s))  Surgical PCR screen     Status: None   Collection Time: 08/06/18  7:42 AM   Specimen: Nasal Mucosa; Nasal Swab  Result Value Ref Range Status   MRSA, PCR NEGATIVE NEGATIVE Final   Staphylococcus aureus NEGATIVE NEGATIVE Final    Comment: (NOTE) The Xpert SA Assay (FDA approved for NASAL specimens in patients 48 years of age and older), is one component of Brihany Butch comprehensive surveillance program. It is not intended to diagnose infection nor to guide or monitor treatment. Performed at [facility], [place] [address], [place], [place] [address]   SARS Coronavirus 2 (Hosp order,Performed in [facility] lab via Abbott ID)     Status: None   Collection Time: 08/11/18 11:10 AM   Specimen: Dry Nasal Swab (Abbott ID Now)  Result Value Ref Range Status   SARS Coronavirus 2 (Abbott ID Now) NEGATIVE NEGATIVE Final    Comment: (NOTE) Interpretive Result Comment(s): COVID 19 Positive SARS CoV 2 target nucleic acids are DETECTED. The SARS CoV 2 RNA is generally detectable in upper and lower respiratory specimens during the acute phase of infection.  Positive results are indicative of active infection  with SARS CoV 2.  Clinical correlation with patient history and other diagnostic information is necessary  to determine patient infection status.  Positive results do not rule out bacterial infection or coinfection with other viruses. The expected result is Negative. COVID 19 Negative SARS CoV 2 target nucleic acids are NOT DETECTED. The SARS CoV 2 RNA is generally detectable in upper and lower respiratory specimens during the acute phase of infection.  Negative results do not preclude SARS CoV 2 infection, do not rule out coinfections with other pathogens, and should not be used as the sole basis for treatment or other patient management decisions.  Negative results must be combined with clinical  observations, patient history, and epidemiological information. The expected result is Negative. Invalid Presence or absence of SARS CoV 2 nucleic acids cannot be determined. Repeat testing was performed on the submitted specimen and repeated Invalid results were obtained.  If clinically indicated, additional testing on Bridgitte Felicetti new specimen with an alternate test methodology [phone]) is advised.  The SARS CoV 2 RNA is generally detectable in upper and lower respiratory specimens during the acute phase of infection. The expected result is Negative. Fact Sheet for Patients:  GolfingFamily.no Fact Sheet for Healthcare Providers: https://www.hernandez-brewer.com/ This test is not yet approved or cleared by the [place] FDA and has been authorized for detection and/or diagnosis of SARS CoV 2 by FDA under an Emergency Use Authorization (EUA).  This EUA will remain in effect (meaning this test can be used) for the duration of the COVID19 d eclaration under Section 564(b)(1) of the Act, 21 U.S.C. section [phone] 3(b)(1), unless the authorization is terminated or revoked sooner. Performed at [facility], [address] [place], [place] [address]   Respiratory Panel by PCR     Status: None   Collection Time: 08/11/18  4:30 PM   Specimen: Nasopharyngeal  Swab; Respiratory  Result Value Ref Range Status   Adenovirus NOT DETECTED NOT DETECTED Final   Coronavirus 229E NOT DETECTED NOT DETECTED Final    Comment: (NOTE) The Coronavirus on the Respiratory Panel, DOES NOT test for the novel  Coronavirus (2019 nCoV)    Coronavirus HKU1 NOT DETECTED NOT DETECTED Final   Coronavirus NL63 NOT DETECTED NOT DETECTED Final   Coronavirus OC43 NOT DETECTED NOT DETECTED Final   Metapneumovirus NOT DETECTED NOT DETECTED Final   Rhinovirus / Enterovirus NOT DETECTED NOT DETECTED Final   Influenza Shawndale Kilpatrick NOT DETECTED NOT DETECTED Final   Influenza B NOT DETECTED NOT DETECTED Final   Parainfluenza Virus 1 NOT DETECTED NOT DETECTED Final   Parainfluenza Virus 2 NOT DETECTED NOT DETECTED Final   Parainfluenza Virus 3 NOT DETECTED NOT DETECTED Final   Parainfluenza Virus 4 NOT DETECTED NOT DETECTED Final   Respiratory Syncytial Virus NOT DETECTED NOT DETECTED Final   Bordetella pertussis NOT DETECTED NOT DETECTED Final   Chlamydophila pneumoniae NOT DETECTED NOT DETECTED Final   Mycoplasma pneumoniae NOT DETECTED NOT DETECTED Final    Comment: Performed at [facility], [place]. [address] [place], [place] [address]  Culture, blood (routine x 2)     Status: None (Preliminary result)   Collection Time: 08/11/18  5:05 PM   Specimen: Right Antecubital; Blood  Result Value Ref Range Status   Specimen Description   Final    RIGHT ANTECUBITAL Performed at [place] [address], [place], [place] [address]    Special Requests   Final    BOTTLES DRAWN AEROBIC ONLY  Blood Culture adequate volume Performed at [place] [address], [place], [place] [address]    Culture   Final    NO GROWTH 4 DAYS Performed at [facility], [place] [address], [place], [place] [address]    Report Status PENDING  Incomplete  Culture, blood (routine x 2)     Status: None (Preliminary result)   Collection Time: 08/11/18  5:05 PM    Specimen: BLOOD  Result Value Ref Range Status   Specimen Description   Final    BLOOD RIGHT ARM Performed at [place] [address] [place], [place] [address]    Special Requests   Final    BOTTLES DRAWN AEROBIC ONLY Blood Culture adequate volume Performed at [place] [address] [place], [place] [address]    Culture   Final    NO GROWTH 4 DAYS Performed at [facility], [place] [address], [place], [place] [address]    Report Status PENDING  Incomplete  SARS Coronavirus 2 (CEPHEID - Performed in [place] hospital lab), Hosp Order     Status: None   Collection Time: 08/12/18  9:06 AM   Specimen: Nasopharyngeal Swab  Result Value Ref Range Status   SARS Coronavirus 2 NEGATIVE NEGATIVE Final    Comment: (NOTE) If result is NEGATIVE SARS-CoV-2 target nucleic acids are NOT DETECTED. The SARS-CoV-2 RNA is generally detectable in upper and lower  respiratory specimens during the acute phase of infection. The lowest  concentration of SARS-CoV-2 viral copies this assay can detect is 250  copies / mL. Chameka Mcmullen negative result does not preclude SARS-CoV-2 infection  and should not be used as the sole basis for treatment or other  patient management decisions.  Josaiah Muhammed negative result may occur with  improper specimen collection / handling, submission of specimen other  than nasopharyngeal swab, presence of viral mutation(s) within the  areas targeted by this assay, and inadequate number of viral copies  (<250 copies / mL). Antoin Dargis negative result must be combined with clinical  observations, patient history, and epidemiological information. If result is POSITIVE SARS-CoV-2 target nucleic acids are DETECTED. The SARS-CoV-2 RNA is generally detectable in upper and lower  respiratory specimens dur ing the acute phase of infection.  Positive  results are indicative of active infection with SARS-CoV-2.  Clinical  correlation with patient history  and other diagnostic information is  necessary to determine patient infection status.  Positive results do  not rule out bacterial infection or co-infection with other viruses. If result is PRESUMPTIVE POSTIVE SARS-CoV-2 nucleic acids MAY BE PRESENT.   Zephaniah Enyeart presumptive positive result was obtained on the submitted specimen  and confirmed on repeat testing.  While 2019 novel coronavirus  (SARS-CoV-2) nucleic acids may be present in the submitted sample  additional confirmatory testing may be necessary for epidemiological  and / or clinical management purposes  to differentiate between  SARS-CoV-2 and other Sarbecovirus currently known to infect humans.  If clinically indicated additional testing with an alternate test  methodology [phone]) is advised. The SARS-CoV-2 RNA is generally  detectable in upper and lower respiratory sp ecimens during the acute  phase of infection. The expected result is Negative. Fact Sheet for Patients:  StrictlyIdeas.no Fact Sheet for Healthcare Providers: BankingDealers.co.za This test is not yet approved or cleared by the [place] FDA and has been authorized for detection and/or diagnosis of SARS-CoV-2 by FDA under an Emergency Use Authorization (EUA).  This  EUA will remain in effect (meaning this test can be used) for the duration of the COVID-19 declaration under Section 564(b)(1) of the Act, 21 U.S.C. section 360bbb-3(b)(1), unless the authorization is terminated or revoked sooner. Performed at [facility], [place] [address] [place], [place] [address]      Labs: Basic Metabolic Panel: Recent Labs  Lab 08/11/18 0734 08/12/18 0351 08/13/18 0356 08/14/18 0422 08/15/18 0401  NA 136 137 136 135 136  K 3.8 3.9 4.0 4.1 4.0  CL 102 102 102 102 106  CO2 _0 GLUCOSE 123* 119* 116* 122* 103*  BUN _1 CREATININE 1.04 0.99 1.30* 1.39* 1.15  CALCIUM 9.2 9.0 8.9  9.1 9.0   Liver Function Tests: Recent Labs  Lab 08/11/18 0734 08/12/18 0351  AST 14* 27  ALT 30 48*  ALKPHOS 109 131*  BILITOT 1.1 1.2  PROT 6.8 6.5  ALBUMIN 3.0* 2.6*   Recent Labs  Lab 08/11/18 0734  LIPASE 21   No results for input(s): AMMONIA in the last 168 hours. CBC: Recent Labs  Lab 08/09/18 0427 08/11/18 0734 08/12/18 0351 08/13/18 0356 08/15/18 0401  WBC 9.0 8.6 7.0 6.9 7.0  NEUTROABS  --  5.7 4.9 4.7 4.8  HGB 10.4* 10.9* 9.8* 9.6* 9.3*  HCT 31.6* 33.2* 30.7* 30.3* 29.5*  MCV 103.6* 102.8* 105.1* 105.6* 107.7*  PLT 182 228 238 252 297   Cardiac Enzymes: No results for input(s): CKTOTAL, CKMB, CKMBINDEX, TROPONINI in the last 168 hours. BNP: BNP (last 3 results) Recent Labs    08/11/18 0734  BNP 118.7*    ProBNP (last 3 results) No results for input(s): PROBNP in the last 8760 hours.  CBG: Recent Labs  Lab 08/14/18 1133 08/14/18 1633 08/14/18 2116 08/15/18 0736 08/15/18 1125  GLUCAP 95 103* 114* 82 118*       Signed:  Fayrene Helper MD.  [facility] 08/15/2018, 12:21 PM

## 2018-08-15 NOTE — Plan of Care (Signed)
Pt was discharged home today. Instructions were reviewed with patient, and questions were answered. Pt was taken to main entrance via wheelchair by NT.  

## 2018-08-15 NOTE — Care Management Important Message (Signed)
Important Message  Patient Details IM Letter given to Servando Snare SW to present to the Patient Name: Derek Curry MRN: [id number] Date of Birth: 1940/07/03   Medicare Important Message Given:  Yes     Kerin Salen 08/15/2018, 10:58 AM

## 2018-08-16 ENCOUNTER — Other Ambulatory Visit: Payer: Self-pay

## 2018-08-16 LAB — CULTURE, BLOOD (ROUTINE X 2)
Culture: NO GROWTH
Culture: NO GROWTH
Special Requests: ADEQUATE
Special Requests: ADEQUATE

## 2018-08-16 NOTE — Patient Outreach (Signed)
  [facility]) Care Management Chronic Special Needs Program    08/16/2018  Name: Derek Curry, DOB: 1940-03-09  MRN: [id number]   Derek Curry is enrolled in a chronic special needs plan. Client admitted to the hospital on 08/11/2018 for HCAP. Discharged to home on 08/15/2018.  Plan: Transition of care per utilization management team. Assigned C-SNP care management coordinator, Peter Garter, RNCM to continue to follow.   Covering RNCM: Thea Silversmith, RN, MSN, Ashley Winters [phone]

## 2018-08-22 DIAGNOSIS — E1142 Type 2 diabetes mellitus with diabetic polyneuropathy: Secondary | ICD-10-CM | POA: Diagnosis not present

## 2018-08-22 DIAGNOSIS — Z09 Encounter for follow-up examination after completed treatment for conditions other than malignant neoplasm: Secondary | ICD-10-CM | POA: Diagnosis not present

## 2018-08-22 DIAGNOSIS — J159 Unspecified bacterial pneumonia: Secondary | ICD-10-CM | POA: Diagnosis not present

## 2018-08-22 DIAGNOSIS — D539 Nutritional anemia, unspecified: Secondary | ICD-10-CM | POA: Diagnosis not present

## 2018-08-22 DIAGNOSIS — N179 Acute kidney failure, unspecified: Secondary | ICD-10-CM | POA: Diagnosis not present

## 2018-08-31 DIAGNOSIS — D539 Nutritional anemia, unspecified: Secondary | ICD-10-CM | POA: Diagnosis not present

## 2018-09-11 ENCOUNTER — Ambulatory Visit: Payer: Self-pay

## 2018-09-11 DIAGNOSIS — I480 Paroxysmal atrial fibrillation: Secondary | ICD-10-CM | POA: Diagnosis not present

## 2018-09-11 DIAGNOSIS — G4733 Obstructive sleep apnea (adult) (pediatric): Secondary | ICD-10-CM | POA: Diagnosis not present

## 2018-09-11 DIAGNOSIS — Z950 Presence of cardiac pacemaker: Secondary | ICD-10-CM | POA: Diagnosis not present

## 2018-09-11 DIAGNOSIS — I10 Essential (primary) hypertension: Secondary | ICD-10-CM | POA: Diagnosis not present

## 2018-09-19 ENCOUNTER — Other Ambulatory Visit: Payer: Self-pay

## 2018-09-19 NOTE — Patient Outreach (Signed)
  [facility]) Care Management Chronic Special Needs Program  09/19/2018  Name: Derek Curry DOB: Sep 20, 1940  MRN: [id number]  Mr. [name] is enrolled in a chronic special needs plan for Diabetes. Reviewed and updated care plan.  Subjective: Client states that he is feeling good.  States he seeing his doctor on 09/25/18 for a follow up.  States his appetite is good.  States his blood sugars have been good rin the 135 range after eating.   Goals Addressed            This Visit's Progress   .  Acknowledge receipt of Advanced Directive package   On track   .  Diabetes Patient stated goal to stay off of medication for diabetes for the next 9 months (pt-stated)   On track   . Advanced Care Planning complete by next 9 months   On track   . Client understands the importance of follow-up with providers by attending scheduled visits   On track   . Client will report no worsening of symptoms of Atrial Fibrillation within the next 6 months( continued 09/19/18)   On track   . Client will use Assistive Devices as needed and verbalize understanding of device use   On track   . Client will verbalize knowledge of self management of Hypertension as evidences by BP reading of 140/90 or less; or as defined by provider   On track   . COMPLETED: General - Client will not be readmitted within 30 days (C-SNP)-discharge date 08/15/2018       No readmission within 30 days    . Maintain timely refills of diabetic medication as prescribed within the year .   On track   . Obtain annual  Lipid Profile, LDL-C   On track    Completed 07/17/18    . Obtain Annual Eye (retinal)  Exam    On track   . Obtain Annual Foot Exam   On track   . Obtain annual screen for micro albuminuria (urine) , nephropathy (kidney problems)   On track   . Obtain Hemoglobin A1C at least 2 times per year   On track   . Visit Primary Care Provider or Endocrinologist at least 2 times per year    On track    Admitted to  hospital on 08/11/2018 with diagnosis of healthcare acquired pneumonia, discharged on 08/15/18  Admitted 08/04/18 with dx for abd pain, Acute cholecystitis, cholecystectomy, discharged 08/09/18 Reinforced to keep scheduled provider appts Reviewed number for 24 hour nurse Line Plan:  Send successful outreach letter with a copy of their individualized care plan and Send individual care plan to provider  Chronic care management coordinator will outreach on scheduled follow up in December     Peter Garter RN, [facility], [place] Management Coordinator [place] Management [phone]

## 2018-09-25 DIAGNOSIS — J90 Pleural effusion, not elsewhere classified: Secondary | ICD-10-CM | POA: Diagnosis not present

## 2018-09-25 DIAGNOSIS — J189 Pneumonia, unspecified organism: Secondary | ICD-10-CM | POA: Diagnosis not present

## 2018-09-25 DIAGNOSIS — Z8701 Personal history of pneumonia (recurrent): Secondary | ICD-10-CM | POA: Diagnosis not present

## 2018-09-25 DIAGNOSIS — Z950 Presence of cardiac pacemaker: Secondary | ICD-10-CM | POA: Diagnosis not present

## 2018-09-25 DIAGNOSIS — I517 Cardiomegaly: Secondary | ICD-10-CM | POA: Diagnosis not present

## 2018-09-25 DIAGNOSIS — J159 Unspecified bacterial pneumonia: Secondary | ICD-10-CM | POA: Diagnosis not present

## 2018-09-25 DIAGNOSIS — D539 Nutritional anemia, unspecified: Secondary | ICD-10-CM | POA: Diagnosis not present

## 2018-09-25 DIAGNOSIS — Z09 Encounter for follow-up examination after completed treatment for conditions other than malignant neoplasm: Secondary | ICD-10-CM | POA: Diagnosis not present

## 2018-10-18 ENCOUNTER — Ambulatory Visit: Payer: Self-pay | Admitting: Pharmacist

## 2018-10-18 ENCOUNTER — Other Ambulatory Visit: Payer: Self-pay | Admitting: Pharmacist

## 2018-10-18 NOTE — Patient Outreach (Addendum)
[facility]) Care Management  10/18/2018  Derek Curry 1940-12-24 [id number]   Patient was called for [facility] quarterly follow up. Unfortunately, he did not answer the phone. HIPAA compliant message was left with the male who answered the phone.  Plan: Call patient back in 6-8 weeks.   Elayne Guerin, PharmD, [place] Clinical Pharmacist [phone]

## 2018-11-05 DIAGNOSIS — Z950 Presence of cardiac pacemaker: Secondary | ICD-10-CM | POA: Diagnosis not present

## 2018-12-17 ENCOUNTER — Other Ambulatory Visit: Payer: Self-pay | Admitting: Pharmacist

## 2018-12-17 NOTE — Patient Outreach (Signed)
[facility]) Care Management  12/17/2018  Derek Curry 08/27/1940 [id number]   Patient was called for [place] Follow up. HIPAA identifiers were obtained x2. Patient reported feeling well and did not have any medication concerns. He reported receiving Extra Help and said he did not have an issue paying for his medications.  Patient is a 78 year old male with multiple medical conditions including but not limited to: gout, seasonal allergies, hypertension, hyperlipidemia, OSA, A fib, pacemaker placement, total left knee replacement, and type 2 diabetes.  HgA1c- 7% 07/2018 Controlled by diet and exercise On statin-Simvastatin-last filled 07/08/2018 per fill history. Patient said he filled Simvastatin last on 10/12/18.  The fill date was confirmed with [facility].   Patient's medications were reviewed over the phone: Medications Reviewed Today    Reviewed by Elayne Guerin, [facility] (Pharmacist) on 12/17/18 at 1041  Med List Status: <None>  Medication Order Taking? Sig Documenting Provider Last Dose Status Informant  allopurinol (ZYLOPRIM) 300 MG tablet [id number] Yes Take 300 mg by mouth daily. [provider] Taking Active Self  aluminum-magnesium hydroxide-simethicone (MAALOX) 340-370-96 MG/5ML SUSP [id number] Yes Take 30 mLs by mouth 4 (four) times daily as needed (heart burn). [provider] Taking Active Self  apixaban (ELIQUIS) 5 MG TABS tablet [id number] Yes Take 5 mg by mouth 2 (two) times daily.  [provider] Taking Active Self  Blood Glucose Monitoring Suppl (ONE TOUCH ULTRA 2) w/Device KIT [id number] Yes USE TO CHECK BLOOD SUGAR DAILY [provider] Taking Active Self  diltiazem (CARDIZEM CD) 240 MG 24 hr capsule [id number] Yes Take 1 capsule by mouth daily. [provider] Taking Active Self  fluticasone (FLONASE) 50 MCG/ACT nasal spray [id number] Yes Place 1 spray into both nostrils daily as needed for allergies or rhinitis.  [provider] Taking Active Self  glucose blood (ACCU-CHEK GUIDE) test strip [id number]  USE TO CHECK BLOOD SUGAR TWICE A DAY [provider]  Active Self  lisinopril-hydrochlorothiazide (PRINZIDE,ZESTORETIC) 20-25 MG tablet [id number] Yes Take 1 tablet by mouth daily. [provider] Taking Active Self  Magnesium Oxide 250 MG TABS [id number] Yes Take 250 mg by mouth every evening.  [provider] Taking Active Self  meclizine (ANTIVERT) 12.5 MG tablet [id number] Yes Take 12.5 mg by mouth 3 (three) times daily as needed for dizziness or nausea.  [provider] Taking Active Self  omeprazole (PRILOSEC) 40 MG capsule [id number] Yes Take 1 capsule by mouth daily. [provider] Taking Active Self  ONE TOUCH ULTRA TEST test strip [id number] Yes USE TO CHECK BLOOD SUGAR DAILY [provider] Taking Active Self       Patient not taking:      Discontinued 12/17/18 1041 (Completed Course)   valACYclovir (VALTREX) 500 MG tablet [id number] Yes Take 500 mg by mouth 2 (two) times daily as needed (outbreak).  [provider] Taking Active Self  zolpidem (AMBIEN) 5 MG tablet [id number] Yes Take 1 tablet by mouth at bedtime. [provider] Taking Active Self         Plan: Follow up with the patient in 3-4 months.  Elayne Guerin, PharmD, [place] Clinical Pharmacist [phone]

## 2018-12-19 DIAGNOSIS — D539 Nutritional anemia, unspecified: Secondary | ICD-10-CM | POA: Diagnosis not present

## 2019-01-15 ENCOUNTER — Other Ambulatory Visit: Payer: Self-pay

## 2019-01-15 NOTE — Patient Outreach (Signed)
[facility]) Care Management Chronic Special Needs Program  01/15/2019  Name: Derek Curry DOB: [birth year]  MRN: [id number]  [name] is enrolled in a chronic special needs plan for Diabetes. Reviewed and updated care plan.  Subjective: Client states that he has been feeling better and he is recovered from his gallbladder surgery back in the summer.  States he is to see his doctor for his physical next week and he has his eye exam on 01/17/19.  States his blood sugars are usually 80-85 in the morning and sometimes up to 180 after meals.  States he is still on no medications for diabetes and he would like it to stay that way.  Denies any difficulty with his medications as he gets extra help.  States he has the Advanced Directives forms but has not completed them.  States his B/P has been good when he goes to the doctor.  States he is eating smaller portions, not getting seconds and he has stopped drinking soda   Goals Addressed            This Visit's Progress   . COMPLETED:  Acknowledge receipt of Advanced Directive package       Received packet    . Advanced Care Planning complete by next 9 months(continue 01/15/19)   On track   . Client understands the importance of follow-up with providers by attending scheduled visits   On track   . COMPLETED: Client will use Assistive Devices as needed and verbalize understanding of device use   On track    Denies issues with glucometer    . Client will verbalize knowledge of self management of Hypertension as evidences by BP reading of 140/90 or less; or as defined by provider   On track    Reports BP below 140/90    . Diabetes Patient stated goal to stay off of medication for diabetes for the next 9 months(continue 01/15/19) (pt-stated)   On track    Remains off of diabetes medications    . HEMOGLOBIN A1C < 7.0       Diabetes self management actions:  Glucose monitoring per provider recommendations  Eat  Healthy  Check feet daily  Visit provider every 3-6 months as directed  Hbg A1C level every 3-6 months.  Eye Exam yearly    . COMPLETED: Maintain timely refills of diabetic medication as prescribed within the year .   On track    Reports no issues with medications    . COMPLETED: Obtain Annual Eye (retinal)  Exam        To be completed 01/17/19    . Obtain Annual Foot Exam   On track   . Obtain annual screen for micro albuminuria (urine) , nephropathy (kidney problems)   On track   . COMPLETED: Obtain Hemoglobin A1C at least 2 times per year       Completed 07/17/18, 08/11/18    . COMPLETED: Visit Primary Care Provider or Endocrinologist at least 2 times per year        Completed 07/17/18,08/22/18,09/25/18     Client is  meeting diabetes self management goal of hemoglobin A1C of <7% with last reading of 6.9% Client continues to not be on any diabetes medications.  Reports making changes of smaller portions and no sodas Reinforced to follow a low CHO, low sodium diet and to watch his portion sizes Encouraged to gradually increase his activity as tolerated and reviewed the glycemic benefits of regular exercise  Encouraged to complete his Advanced Directives  Reviewed number for 24-hour nurse Line Reviewed COVID 19 precautions  Plan:  Send successful outreach letter with a copy of their individualized care plan, Send individual care plan to provider and [place] 2021 calendar  Chronic care management coordinator will outreach in:  4-6 Months per tier level     Peter Garter RN, [employer], [place] Management Coordinator [place] Management [phone]

## 2019-01-17 DIAGNOSIS — E119 Type 2 diabetes mellitus without complications: Secondary | ICD-10-CM | POA: Diagnosis not present

## 2019-01-17 DIAGNOSIS — H2512 Age-related nuclear cataract, left eye: Secondary | ICD-10-CM | POA: Diagnosis not present

## 2019-01-17 DIAGNOSIS — H524 Presbyopia: Secondary | ICD-10-CM | POA: Diagnosis not present

## 2019-01-17 DIAGNOSIS — H5212 Myopia, left eye: Secondary | ICD-10-CM | POA: Diagnosis not present

## 2019-01-17 DIAGNOSIS — H52223 Regular astigmatism, bilateral: Secondary | ICD-10-CM | POA: Diagnosis not present

## 2019-01-17 DIAGNOSIS — H33322 Round hole, left eye: Secondary | ICD-10-CM | POA: Diagnosis not present

## 2019-01-17 DIAGNOSIS — H35423 Microcystoid degeneration of retina, bilateral: Secondary | ICD-10-CM | POA: Diagnosis not present

## 2019-02-04 DIAGNOSIS — Z950 Presence of cardiac pacemaker: Secondary | ICD-10-CM | POA: Diagnosis not present

## 2019-02-05 DIAGNOSIS — E782 Mixed hyperlipidemia: Secondary | ICD-10-CM | POA: Diagnosis not present

## 2019-02-05 DIAGNOSIS — M1A0720 Idiopathic chronic gout, left ankle and foot, without tophus (tophi): Secondary | ICD-10-CM | POA: Diagnosis not present

## 2019-02-05 DIAGNOSIS — Z125 Encounter for screening for malignant neoplasm of prostate: Secondary | ICD-10-CM | POA: Diagnosis not present

## 2019-02-05 DIAGNOSIS — E1142 Type 2 diabetes mellitus with diabetic polyneuropathy: Secondary | ICD-10-CM | POA: Diagnosis not present

## 2019-02-05 DIAGNOSIS — R790 Abnormal level of blood mineral: Secondary | ICD-10-CM | POA: Diagnosis not present

## 2019-02-06 DIAGNOSIS — M1A0720 Idiopathic chronic gout, left ankle and foot, without tophus (tophi): Secondary | ICD-10-CM | POA: Diagnosis not present

## 2019-02-06 DIAGNOSIS — I872 Venous insufficiency (chronic) (peripheral): Secondary | ICD-10-CM | POA: Diagnosis not present

## 2019-02-06 DIAGNOSIS — Z0000 Encounter for general adult medical examination without abnormal findings: Secondary | ICD-10-CM | POA: Diagnosis not present

## 2019-02-06 DIAGNOSIS — E1122 Type 2 diabetes mellitus with diabetic chronic kidney disease: Secondary | ICD-10-CM | POA: Diagnosis not present

## 2019-02-06 DIAGNOSIS — N1831 Chronic kidney disease, stage 3a: Secondary | ICD-10-CM | POA: Diagnosis not present

## 2019-03-20 DIAGNOSIS — R1033 Periumbilical pain: Secondary | ICD-10-CM | POA: Diagnosis not present

## 2019-03-25 ENCOUNTER — Other Ambulatory Visit: Payer: Self-pay | Admitting: General Surgery

## 2019-03-25 DIAGNOSIS — R1013 Epigastric pain: Secondary | ICD-10-CM

## 2019-03-25 DIAGNOSIS — R1033 Periumbilical pain: Secondary | ICD-10-CM

## 2019-04-03 ENCOUNTER — Ambulatory Visit
Admission: RE | Admit: 2019-04-03 | Discharge: 2019-04-03 | Disposition: A | Discharge status: Home / Self Care | Payer: HMO | Source: Ambulatory Visit | Attending: General Surgery | Admitting: General Surgery

## 2019-04-03 DIAGNOSIS — R1033 Periumbilical pain: Secondary | ICD-10-CM

## 2019-04-03 DIAGNOSIS — K5730 Diverticulosis of large intestine without perforation or abscess without bleeding: Secondary | ICD-10-CM | POA: Diagnosis not present

## 2019-04-03 DIAGNOSIS — R1013 Epigastric pain: Secondary | ICD-10-CM

## 2019-04-10 ENCOUNTER — Other Ambulatory Visit: Payer: Self-pay

## 2019-04-10 NOTE — Patient Outreach (Signed)
[facility]) Care Management Chronic Special Needs Program  04/10/2019  Name: Derek Curry DOB: Jan 07, 1941  MRN: [id number]  [name] is enrolled in a chronic special needs plan for Diabetes. Chronic Care Management Coordinator telephoned client to review health risk assessment and to develop individualized care plan.  Reviewed the chronic care management program, importance of client participation, and taking their care plan to all provider appointments and inpatient facilities.  Reviewed the transition of care process and possible referral to community care management.  Subjective: Client states he has been doing good.  States he had his physical in December and his Hemoglobin A1C was 7%.  States he is still does not have to take any diabetes medications.  States he would like to continue to lose weight and start walking.  States he is checking his blood sugars daily and they are ranging 110-140 in the morning.  States he would like to have more Advanced Directives forms sent to him.  States he is to see his cardiologist in March for his atrial fibrillation and he is taking his Eliquis as ordered.  Denies any issues with affording his medications.  States his B/P has been good when checked.   States he plans to sign up to get his COVID shot  Goals Addressed            This Visit's Progress   . Advanced Care Planning complete by next 9 months(continue 04/10/19)   Not on track    Reinforced importance of having Advanced Directives   Resent Advanced Directives  packet Sent EMMI-Advanced Directives      . Client understands the importance of follow-up with providers by attending scheduled visits   On track    Plan to keep scheduled appointments with providers Reinforced to keep scheduled appointments with providers    . Client will report no worsening of symptoms of Atrial Fibrillation within the next 6 months( continued 04/10/19)   On track    Reports atrial  fibrillation unchanged Reviewed signs and symptoms to call doctor and when to call 911 Plan to take anticoagulant as ordered to prevent stroke     . Client will verbalize knowledge of self management of Hypertension as evidences by BP reading of 140/90 or less; or as defined by provider   On track    Reports BP below 140/90 when checked at home and at provider visits Plan to check B/P regularly Take B/P medications as ordered Plan to follow a low salt diet  Increase activity as tolerated EMMI: High Blood Pressure(Hypertension) What you can do    . Diabetes Patient stated goal to stay off of medication for diabetes for the next 9 months(continue 01/15/19) (pt-stated)   On track    Remains off of diabetes medications Plan to refer to [employer] Health coach to work on weight loss     . Diabetes Patient stated goal to walk 20-30 minutes 3 times a week for the next 6 months (pt-stated)       Instructed to start exercise/walking program slowly and to pace himself    . HEMOGLOBIN A1C < 7.0       Last Hemoglobin A1C 7% on 02/06/19 Reviewed fasting blood sugar goals of 80-130 and less than 180 1 1/2-2 hours after meals Reinforced to follow a low carbohydrate low salt diet and to watch portion sizes Instructed to review handouts diabetes checklist and diabetes-controlling blood sugar    . Obtain annual  Lipid  Profile, LDL-C   On track    Last completed 02/06/19 LDL 62 The goal for LDL is less than 70 mg/dL as you are at high risk for complications Try to avoid saturated fats, trans-fats and eat more fiber    . Obtain Annual Eye (retinal)  Exam    On track    Last completed 01/17/19 Plan to have a dilated eye exam every year    . Obtain Annual Foot Exam   On track    Last completed 02/06/19 Check your skin and feet every day for cuts, bruises, redness, blisters, or sores. Schedule a foot exam with your health care provider once every year    . Obtain annual screen for micro  albuminuria (urine) , nephropathy (kidney problems)   On track    Last completed 02/06/19 It is important for your doctor to check your urine for protein at least every year    . Obtain Hemoglobin A1C at least 2 times per year   On track    Last completed 02/06/19 It is important to have your Hemoglobin A1C checked every 6 months if you are at goal and every 3 months if you are not at goal    . Visit Primary Care Provider or Endocrinologist at least 2 times per year    On track    Last primary care visit 02/06/19 for Annual Wellness visit Please schedule your annual wellness visit     Client is not meeting diabetes self management goal of hemoglobin A1C of <7% with last reading of 7% Reviewed number for 24-hour [place] 19 precautions Encouraged to sign up for COVID vaccination when possible Plan:  Send successful outreach letter with a copy of their individualized care plan, Send individual care plan to provider and Send educational material Advanced Directives packet and EMMI, EMMI: High Blood Pressure(Hypertension) What you can do  Chronic care management coordination will outreach in:  6 Months  Will refer client to:  [place] RN, [facility], [place] Management [phone]

## 2019-04-11 ENCOUNTER — Ambulatory Visit: Payer: Self-pay

## 2019-04-16 ENCOUNTER — Ambulatory Visit: Payer: Self-pay | Admitting: Pharmacist

## 2019-04-23 DIAGNOSIS — I129 Hypertensive chronic kidney disease with stage 1 through stage 4 chronic kidney disease, or unspecified chronic kidney disease: Secondary | ICD-10-CM | POA: Diagnosis not present

## 2019-04-23 DIAGNOSIS — G4733 Obstructive sleep apnea (adult) (pediatric): Secondary | ICD-10-CM | POA: Diagnosis not present

## 2019-04-23 DIAGNOSIS — J302 Other seasonal allergic rhinitis: Secondary | ICD-10-CM | POA: Diagnosis not present

## 2019-04-23 DIAGNOSIS — I480 Paroxysmal atrial fibrillation: Secondary | ICD-10-CM | POA: Diagnosis not present

## 2019-04-23 DIAGNOSIS — Z45018 Encounter for adjustment and management of other part of cardiac pacemaker: Secondary | ICD-10-CM | POA: Diagnosis not present

## 2019-04-26 DIAGNOSIS — M545 Low back pain: Secondary | ICD-10-CM | POA: Diagnosis not present

## 2019-04-26 DIAGNOSIS — M549 Dorsalgia, unspecified: Secondary | ICD-10-CM | POA: Diagnosis not present

## 2019-04-26 DIAGNOSIS — N1831 Chronic kidney disease, stage 3a: Secondary | ICD-10-CM | POA: Diagnosis not present

## 2019-04-30 DIAGNOSIS — R1033 Periumbilical pain: Secondary | ICD-10-CM | POA: Diagnosis not present

## 2019-05-10 DIAGNOSIS — Z950 Presence of cardiac pacemaker: Secondary | ICD-10-CM | POA: Diagnosis not present

## 2019-06-03 ENCOUNTER — Other Ambulatory Visit: Payer: Self-pay | Admitting: Pharmacist

## 2019-06-03 NOTE — Patient Outreach (Signed)
[facility] ([facility])  [facility] Quality Pharmacy Team    [facility] pharmacy case will be closed as our team is transitioning from the [facility] Care Management Department into the [facility] Quality Department and will no longer be using [facility] for documentation purposes.      Lanice Folden J. Elijan Googe, PharmD, BCACP [facility] Clinical Pharmacist [phone]   

## 2019-06-05 ENCOUNTER — Ambulatory Visit: Payer: Self-pay | Admitting: Pharmacist

## 2019-06-06 DIAGNOSIS — R0789 Other chest pain: Secondary | ICD-10-CM | POA: Diagnosis not present

## 2019-06-06 DIAGNOSIS — I4519 Other right bundle-branch block: Secondary | ICD-10-CM | POA: Diagnosis not present

## 2019-06-06 DIAGNOSIS — R079 Chest pain, unspecified: Secondary | ICD-10-CM | POA: Diagnosis not present

## 2019-06-06 DIAGNOSIS — I444 Left anterior fascicular block: Secondary | ICD-10-CM | POA: Diagnosis not present

## 2019-06-06 DIAGNOSIS — I452 Bifascicular block: Secondary | ICD-10-CM | POA: Diagnosis not present

## 2019-06-14 DIAGNOSIS — K219 Gastro-esophageal reflux disease without esophagitis: Secondary | ICD-10-CM | POA: Diagnosis not present

## 2019-06-14 DIAGNOSIS — R079 Chest pain, unspecified: Secondary | ICD-10-CM | POA: Diagnosis not present

## 2019-06-14 DIAGNOSIS — S161XXA Strain of muscle, fascia and tendon at neck level, initial encounter: Secondary | ICD-10-CM | POA: Diagnosis not present

## 2019-06-14 DIAGNOSIS — G250 Essential tremor: Secondary | ICD-10-CM | POA: Diagnosis not present

## 2019-07-10 DIAGNOSIS — R079 Chest pain, unspecified: Secondary | ICD-10-CM | POA: Diagnosis not present

## 2019-08-20 DIAGNOSIS — E1142 Type 2 diabetes mellitus with diabetic polyneuropathy: Secondary | ICD-10-CM | POA: Diagnosis not present

## 2019-08-20 DIAGNOSIS — N1831 Chronic kidney disease, stage 3a: Secondary | ICD-10-CM | POA: Diagnosis not present

## 2019-08-21 DIAGNOSIS — G250 Essential tremor: Secondary | ICD-10-CM | POA: Diagnosis not present

## 2019-08-21 DIAGNOSIS — E782 Mixed hyperlipidemia: Secondary | ICD-10-CM | POA: Diagnosis not present

## 2019-08-21 DIAGNOSIS — M1A0720 Idiopathic chronic gout, left ankle and foot, without tophus (tophi): Secondary | ICD-10-CM | POA: Diagnosis not present

## 2019-08-21 DIAGNOSIS — N1831 Chronic kidney disease, stage 3a: Secondary | ICD-10-CM | POA: Diagnosis not present

## 2019-08-21 DIAGNOSIS — R790 Abnormal level of blood mineral: Secondary | ICD-10-CM | POA: Diagnosis not present

## 2019-08-21 DIAGNOSIS — G4709 Other insomnia: Secondary | ICD-10-CM | POA: Diagnosis not present

## 2019-08-21 DIAGNOSIS — K219 Gastro-esophageal reflux disease without esophagitis: Secondary | ICD-10-CM | POA: Diagnosis not present

## 2019-09-05 DIAGNOSIS — N183 Chronic kidney disease, stage 3 unspecified: Secondary | ICD-10-CM | POA: Diagnosis not present

## 2019-09-05 DIAGNOSIS — N1831 Chronic kidney disease, stage 3a: Secondary | ICD-10-CM | POA: Diagnosis not present

## 2019-09-05 DIAGNOSIS — N281 Cyst of kidney, acquired: Secondary | ICD-10-CM | POA: Diagnosis not present

## 2019-09-05 DIAGNOSIS — Z13820 Encounter for screening for osteoporosis: Secondary | ICD-10-CM | POA: Diagnosis not present

## 2019-09-05 DIAGNOSIS — M810 Age-related osteoporosis without current pathological fracture: Secondary | ICD-10-CM | POA: Diagnosis not present

## 2019-09-24 ENCOUNTER — Other Ambulatory Visit: Payer: Self-pay

## 2019-09-24 NOTE — Patient Outreach (Signed)
[facility]) Care Management Chronic Special Needs Program  09/24/2019  Name: Derek Curry DOB: 06/13/1940  MRN: [id number]  Derek Curry is enrolled in a chronic special needs plan for Diabetes. Reviewed and updated care plan.  Subjective: Client states that he saw his doctor last month and he started him on metformin for his diabetes.  States his Hemoglobin A1C was over 7%.  States he is taking the metformin with no problems and did not have GI issues with it.  States he is checking his blood sugars every other day as his doctor told him to take.  States his morning reading are from 110-140 and evening readings are 145-175. States he is trying to eat smaller portions and not getting seconds.  Denies any low blood sugars.  States he is walking on his treadmill or outside 2-3 times a week for 10-15 minutes.  Denies any chest pains or shortness of breath.   Denies any issues with his atrial fibrillation and he is taking his Eliquis without problems.  Denies any issues affording his medications.   States he has the Advanced Directives forms but has not gotten them completed yet.  States his B/P is good when checked.  States he has gotten both of his COVID shots.  States that the health coach has helped him lose about 8 lbs and she has been very helpful.  Goals Addressed              This Visit's Progress   .  Advanced Care Planning complete by next 9 months(continue 09/24/19)   On track     Reinforced importance of having Advanced Directives   Received Advanced Directives  packet    .  COMPLETED: Client understands the importance of follow-up with providers by attending scheduled visits   On track     Keeping appointments Reinforced to keep scheduled appointments with providers Goal completed 09/24/19    .  Client will report no worsening of symptoms of Atrial Fibrillation within the next 6 months( continued 09/24/19)   On track     Reports atrial fibrillation  unchanged Reviewed signs and symptoms to call doctor and when to call 911 Plan to take anticoagulant as ordered to prevent stroke     .  Client will verbalize knowledge of self management of Hypertension as evidences by BP reading of 140/90 or less; or as defined by provider   On track     Reports BP below 140/90 when checked at home and at provider visits Plan to check B/P regularly Take B/P medications as ordered Plan to follow a low salt diet  Increase activity as tolerated     .  Diabetes Patient stated goal to stay off of medication for diabetes for the next 9 months(continue 09/24/19) (pt-stated)   Not on track     Started on metformin Plan to continue to work with [employer] Health coach for weight loss  Reports loss of 8 lbs    .  Diabetes Patient stated goal to walk 20-30 minutes 3 times a week for the next 6 months(continued 09/24/19) (pt-stated)   On track     Reports walking 10-15 minutes 2-3 times a week Reviewed recommendations to exercise 150 minutes a week including 2 sessions of resistance training Plan to increase time and number of days that you walk slowly     .  HEMOGLOBIN A1C < 7        Last Hemoglobin A1C 7.1 %  on 08/21/19 Reinforced fasting blood sugar goals of 80-130 and less than 180 1 1/2-2 hours after meals Reinforced to follow a low carbohydrate low salt diet and to watch portion sizes Reviewed use and possible side effects of metformin Reviewed signs and symptoms of hypoglycemia and actions to take    .  COMPLETED: Obtain annual  Lipid Profile, LDL-C   On track     Last completed 08/21/19 LDL 61 The goal for LDL is less than 70 mg/dL as you are at high risk for complications Try to avoid saturated fats, trans-fats and eat more fiber Plan to take statin as ordered  Goal completed 09/24/19    .  Obtain Annual Eye (retinal)  Exam    On track     Last completed 01/17/19 Plan to have a dilated eye exam every year    .  Obtain Annual Foot Exam    On track     Last completed 08/21/19 Check your skin and feet every day for cuts, bruises, redness, blisters, or sores. Schedule a foot exam with your health care provider once every year Goal completed 09/24/19    .  Obtain annual screen for micro albuminuria (urine) , nephropathy (kidney problems)   On track     Last completed 02/06/19 It is important for your doctor to check your urine for protein at least every year    .  Obtain Hemoglobin A1C at least 2 times per year   On track     Last completed 08/20/19 It is important to have your Hemoglobin A1C checked every 6 months if you are at goal and every 3 months if you are not at goal    .  Visit Primary Care Provider or Endocrinologist at least 2 times per year    On track     Primary care visit 06/14/19, 08/21/19 Please schedule your annual wellness visit       Plan:  Send successful outreach letter with a copy of their individualized care plan and Send individual care plan to provider   Client will be outreached by a [employer] RNCM in 6 months per tier level     [place], [name], [place] Management [phone]

## 2019-09-26 ENCOUNTER — Ambulatory Visit: Payer: Self-pay

## 2019-10-09 ENCOUNTER — Ambulatory Visit: Payer: HMO

## 2019-10-25 DIAGNOSIS — I480 Paroxysmal atrial fibrillation: Secondary | ICD-10-CM | POA: Diagnosis not present

## 2019-10-25 DIAGNOSIS — E1142 Type 2 diabetes mellitus with diabetic polyneuropathy: Secondary | ICD-10-CM | POA: Diagnosis not present

## 2019-10-25 DIAGNOSIS — G4733 Obstructive sleep apnea (adult) (pediatric): Secondary | ICD-10-CM | POA: Diagnosis not present

## 2019-10-25 DIAGNOSIS — I129 Hypertensive chronic kidney disease with stage 1 through stage 4 chronic kidney disease, or unspecified chronic kidney disease: Secondary | ICD-10-CM | POA: Diagnosis not present

## 2019-11-22 DIAGNOSIS — R2243 Localized swelling, mass and lump, lower limb, bilateral: Secondary | ICD-10-CM | POA: Diagnosis not present

## 2019-11-22 DIAGNOSIS — R0602 Shortness of breath: Secondary | ICD-10-CM | POA: Diagnosis not present

## 2019-11-22 DIAGNOSIS — R748 Abnormal levels of other serum enzymes: Secondary | ICD-10-CM | POA: Diagnosis not present

## 2019-11-22 DIAGNOSIS — Z950 Presence of cardiac pacemaker: Secondary | ICD-10-CM | POA: Diagnosis not present

## 2019-11-22 DIAGNOSIS — R944 Abnormal results of kidney function studies: Secondary | ICD-10-CM | POA: Diagnosis not present

## 2019-11-22 DIAGNOSIS — R918 Other nonspecific abnormal finding of lung field: Secondary | ICD-10-CM | POA: Diagnosis not present

## 2019-11-22 DIAGNOSIS — R0789 Other chest pain: Secondary | ICD-10-CM | POA: Diagnosis not present

## 2019-11-22 DIAGNOSIS — I10 Essential (primary) hypertension: Secondary | ICD-10-CM | POA: Diagnosis not present

## 2019-11-22 DIAGNOSIS — R079 Chest pain, unspecified: Secondary | ICD-10-CM | POA: Diagnosis not present

## 2019-11-22 DIAGNOSIS — K219 Gastro-esophageal reflux disease without esophagitis: Secondary | ICD-10-CM | POA: Diagnosis not present

## 2019-11-23 DIAGNOSIS — Z950 Presence of cardiac pacemaker: Secondary | ICD-10-CM | POA: Diagnosis not present

## 2019-11-23 DIAGNOSIS — R079 Chest pain, unspecified: Secondary | ICD-10-CM | POA: Diagnosis not present

## 2019-11-28 DIAGNOSIS — Z7984 Long term (current) use of oral hypoglycemic drugs: Secondary | ICD-10-CM | POA: Diagnosis not present

## 2019-11-28 DIAGNOSIS — E1142 Type 2 diabetes mellitus with diabetic polyneuropathy: Secondary | ICD-10-CM | POA: Diagnosis not present

## 2019-11-28 DIAGNOSIS — E669 Obesity, unspecified: Secondary | ICD-10-CM | POA: Diagnosis not present

## 2019-11-28 DIAGNOSIS — G4730 Sleep apnea, unspecified: Secondary | ICD-10-CM | POA: Diagnosis not present

## 2019-11-28 DIAGNOSIS — Z01812 Encounter for preprocedural laboratory examination: Secondary | ICD-10-CM | POA: Diagnosis not present

## 2019-11-28 DIAGNOSIS — I480 Paroxysmal atrial fibrillation: Secondary | ICD-10-CM | POA: Diagnosis not present

## 2019-11-28 DIAGNOSIS — Z6837 Body mass index (BMI) 37.0-37.9, adult: Secondary | ICD-10-CM | POA: Diagnosis not present

## 2019-11-28 DIAGNOSIS — Z7901 Long term (current) use of anticoagulants: Secondary | ICD-10-CM | POA: Diagnosis not present

## 2019-12-02 DIAGNOSIS — E119 Type 2 diabetes mellitus without complications: Secondary | ICD-10-CM | POA: Diagnosis not present

## 2019-12-02 DIAGNOSIS — Z6841 Body Mass Index (BMI) 40.0 and over, adult: Secondary | ICD-10-CM | POA: Diagnosis not present

## 2019-12-02 DIAGNOSIS — Z794 Long term (current) use of insulin: Secondary | ICD-10-CM | POA: Diagnosis not present

## 2019-12-02 DIAGNOSIS — Z45018 Encounter for adjustment and management of other part of cardiac pacemaker: Secondary | ICD-10-CM | POA: Diagnosis not present

## 2019-12-02 DIAGNOSIS — G4733 Obstructive sleep apnea (adult) (pediatric): Secondary | ICD-10-CM | POA: Diagnosis not present

## 2019-12-02 DIAGNOSIS — Z45010 Encounter for checking and testing of cardiac pacemaker pulse generator [battery]: Secondary | ICD-10-CM | POA: Diagnosis not present

## 2019-12-02 DIAGNOSIS — Z79899 Other long term (current) drug therapy: Secondary | ICD-10-CM | POA: Diagnosis not present

## 2019-12-02 DIAGNOSIS — I495 Sick sinus syndrome: Secondary | ICD-10-CM | POA: Diagnosis not present

## 2019-12-02 DIAGNOSIS — I10 Essential (primary) hypertension: Secondary | ICD-10-CM | POA: Diagnosis not present

## 2019-12-02 DIAGNOSIS — I480 Paroxysmal atrial fibrillation: Secondary | ICD-10-CM | POA: Diagnosis not present

## 2019-12-02 DIAGNOSIS — Z20822 Contact with and (suspected) exposure to covid-19: Secondary | ICD-10-CM | POA: Diagnosis not present

## 2019-12-02 DIAGNOSIS — E782 Mixed hyperlipidemia: Secondary | ICD-10-CM | POA: Diagnosis not present

## 2019-12-04 ENCOUNTER — Other Ambulatory Visit: Payer: Self-pay

## 2019-12-04 DIAGNOSIS — H6982 Other specified disorders of Eustachian tube, left ear: Secondary | ICD-10-CM | POA: Diagnosis not present

## 2019-12-04 NOTE — Patient Outreach (Signed)
  [facility]) Care Management Chronic Special Needs Program  12/04/2019  Name: Derek Curry DOB: [birth year]  MRN: [id number]  Mr. [name] is enrolled in a chronic special needs plan for Diabetes. Client called to follow up from call to Nurse Advise line on 12/03/19 for complaint of rt ear pain.  Client called with no answer No answer and HIPAA compliant message left. Plan for 2nd outreach call in 1-2 days Chronic care management coordinator will attempt outreach in 1-2 days .   Peter Garter RN, [name], CDE Chronic Care Management Coordinator [place] [facility] [phone]

## 2019-12-05 DIAGNOSIS — Z45018 Encounter for adjustment and management of other part of cardiac pacemaker: Secondary | ICD-10-CM | POA: Diagnosis not present

## 2019-12-05 DIAGNOSIS — R001 Bradycardia, unspecified: Secondary | ICD-10-CM | POA: Diagnosis not present

## 2019-12-06 ENCOUNTER — Other Ambulatory Visit: Payer: Self-pay

## 2019-12-06 NOTE — Patient Outreach (Signed)
°  [place] [facility]) Care Management Chronic Special Needs Program  12/06/2019  Name: Derek Curry DOB: Jun 07, 1940  MRN: [id number]  [name] is enrolled in a chronic special needs plan for Diabetes.  Client called to follow up from call to Nurse Advise line on 12/03/19 for complaint of rt ear pain.  Client called with no answer No answer and HIPAA compliant message left. Plan for 3rd outreach call in 3-4 days Chronic care management coordinator will attempt outreach in 3-4 days.   Peter Garter RN, [name], CDE Chronic Care Management Coordinator [place] [facility] [phone]

## 2019-12-10 ENCOUNTER — Other Ambulatory Visit: Payer: Self-pay

## 2019-12-10 NOTE — Patient Outreach (Signed)
  [facility]) Care Management Chronic Special Needs Program  12/10/2019  Name: DOROTHY LANDGREBE DOB: 11-Mar-1940  MRN: [id number]  Mr. Alwin Lanigan is enrolled in a chronic special needs plan for Diabetes.  Client called to follow up from call to Nurse Advise line on 12/03/19 for complaint of rt ear pain HIPAA verified. Client states that he did go see a doctor about his ear and he was told to restart using his Flonase spray.  States his ear pain is better but he still has some knocking noise in his ear Advised client to schedule a follow up appointment with his primary care provider if the knocking does not improve.  Voiced understanding and states he will call to schedule appointment. Client will be outreached by a [employer] RNCM in 6 months per tier level [place], [facility], [place] Management [phone]

## 2019-12-11 DIAGNOSIS — K219 Gastro-esophageal reflux disease without esophagitis: Secondary | ICD-10-CM | POA: Diagnosis not present

## 2019-12-11 DIAGNOSIS — H6983 Other specified disorders of Eustachian tube, bilateral: Secondary | ICD-10-CM | POA: Diagnosis not present

## 2019-12-11 DIAGNOSIS — R079 Chest pain, unspecified: Secondary | ICD-10-CM | POA: Diagnosis not present

## 2020-01-21 ENCOUNTER — Other Ambulatory Visit: Payer: Self-pay

## 2020-01-21 DIAGNOSIS — E1142 Type 2 diabetes mellitus with diabetic polyneuropathy: Secondary | ICD-10-CM | POA: Diagnosis not present

## 2020-01-21 DIAGNOSIS — L603 Nail dystrophy: Secondary | ICD-10-CM | POA: Diagnosis not present

## 2020-01-21 NOTE — Patient Outreach (Signed)
  [facility]) Care Management Chronic Special Needs Program    01/21/2020  Name: Derek Curry, DOB: Aug 05, 1940  MRN: [id number]   Mr. Damian Buckles is enrolled in a chronic special needs plan for Diabetes.  [place] Management will continue to provide services for this client through 02/14/2020. The [employer] care management team will assume care 02/15/2020.  Peter Garter RN, [name], CDE Chronic Care Management Coordinator [place] [facility] [phone]

## 2020-01-24 DIAGNOSIS — H2512 Age-related nuclear cataract, left eye: Secondary | ICD-10-CM | POA: Diagnosis not present

## 2020-01-24 DIAGNOSIS — E119 Type 2 diabetes mellitus without complications: Secondary | ICD-10-CM | POA: Diagnosis not present

## 2020-01-24 DIAGNOSIS — H35423 Microcystoid degeneration of retina, bilateral: Secondary | ICD-10-CM | POA: Diagnosis not present

## 2020-01-24 DIAGNOSIS — H52203 Unspecified astigmatism, bilateral: Secondary | ICD-10-CM | POA: Diagnosis not present

## 2020-01-24 DIAGNOSIS — H5202 Hypermetropia, left eye: Secondary | ICD-10-CM | POA: Diagnosis not present

## 2020-01-24 DIAGNOSIS — H33322 Round hole, left eye: Secondary | ICD-10-CM | POA: Diagnosis not present

## 2020-01-24 DIAGNOSIS — H524 Presbyopia: Secondary | ICD-10-CM | POA: Diagnosis not present

## 2020-02-20 IMAGING — CT CT ABDOMEN W/O CM
2 of 4 series · 12 of 46 positions shown, 14 images · non-contrast
Comparison: 08/04/2018

CLINICAL DATA: Paraumbilical abdominal pain and 7 lb weight loss in
past 6 months.

EXAM:
CT ABDOMEN WITHOUT CONTRAST
TECHNIQUE: Multidetector CT imaging of the abdomen was performed following the
standard protocol without IV contrast.

[Series 2: abd without 5.00 br40 s3 axial · axial · non-contrast · 0.76mm/px · z∈[+1511,+1736]mm · 9 of 57 slices shown, 11 images]
[im 6/57  soft-tissue]
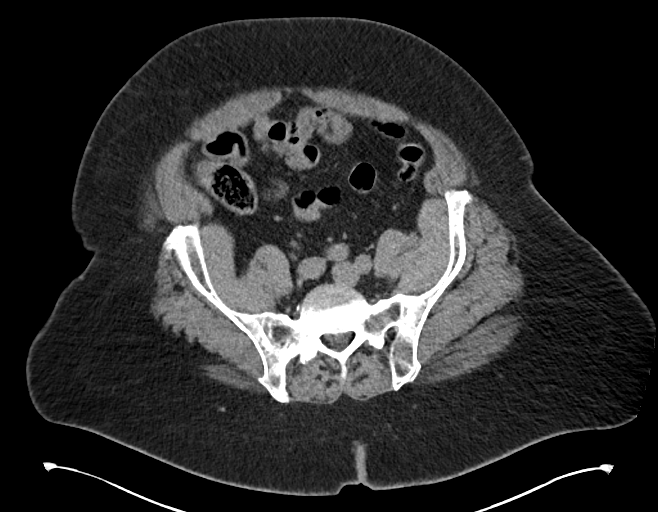
[im 6/57  bone]
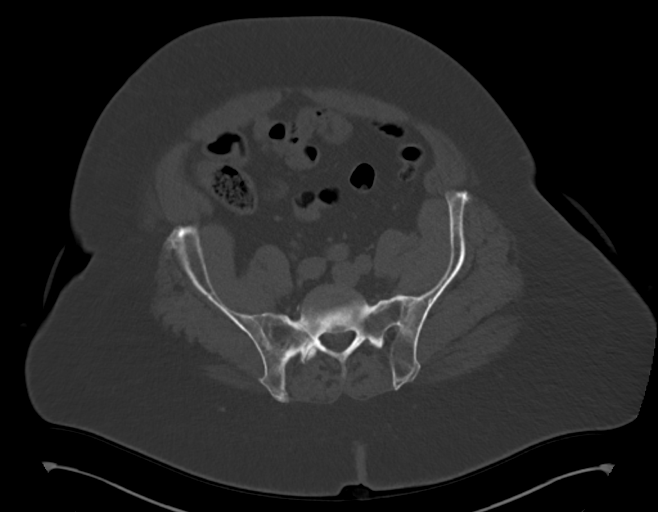
[im 11/57  soft-tissue]
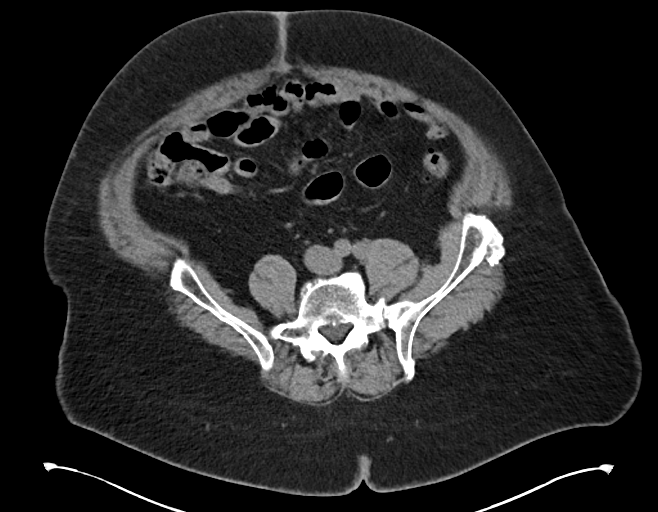
[im 16/57  soft-tissue]
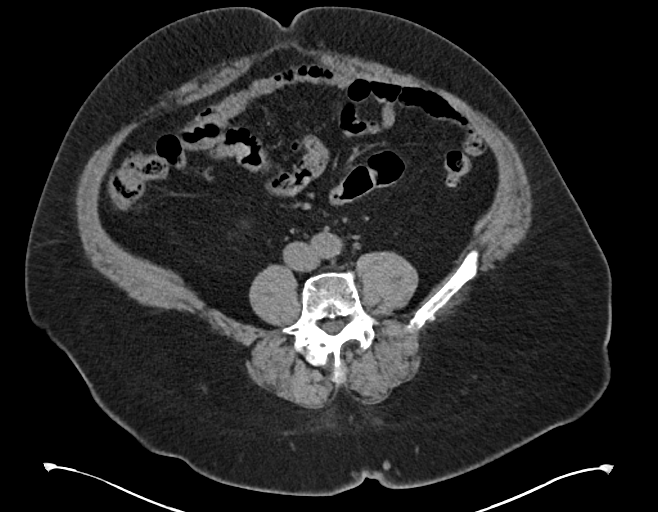
[im 23/57  soft-tissue]
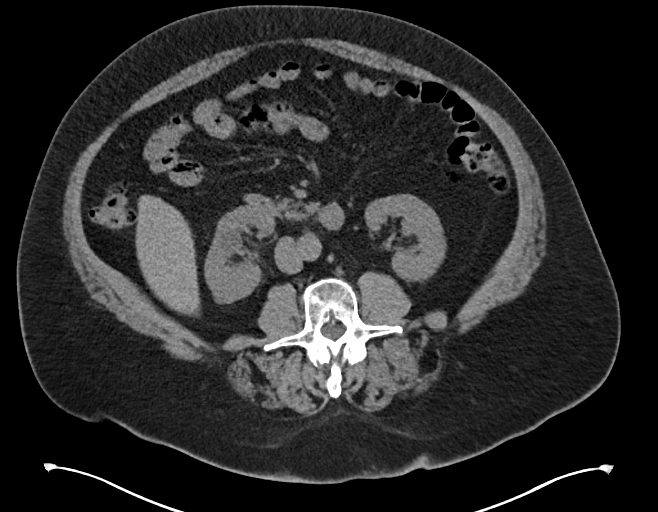
[im 29/57  soft-tissue]
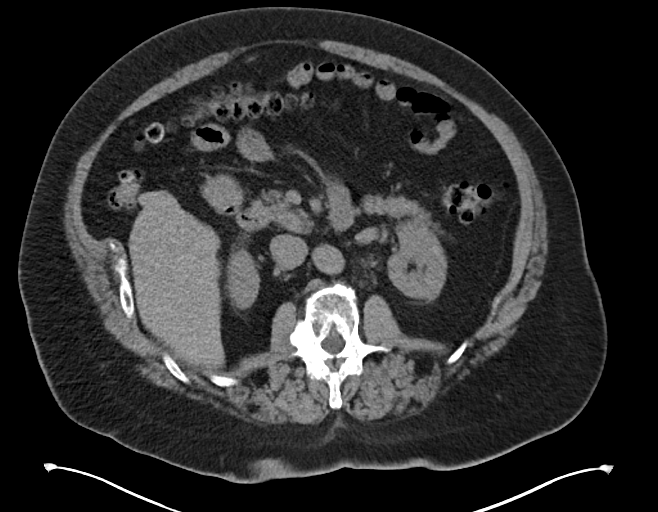
[im 34/57  soft-tissue]
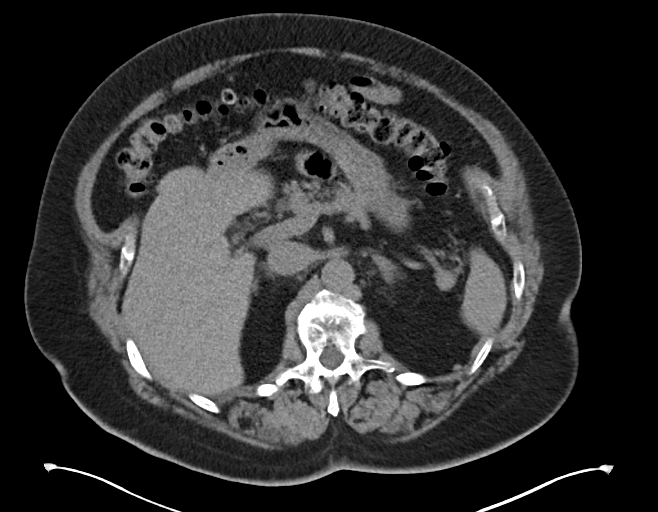
[im 41/57  soft-tissue]
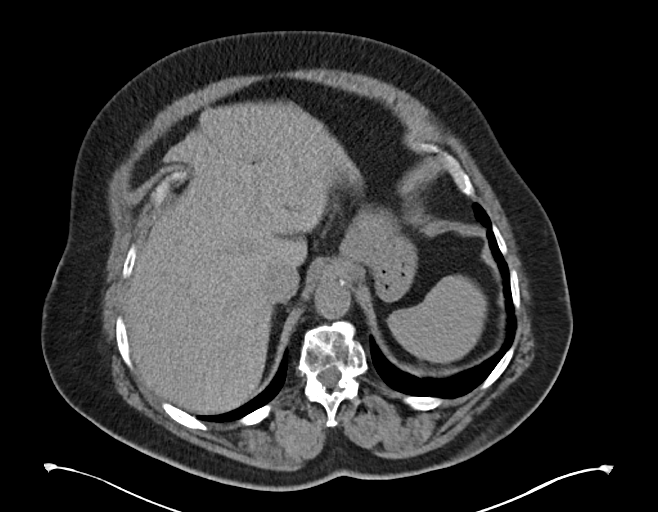
[im 46/57  soft-tissue]
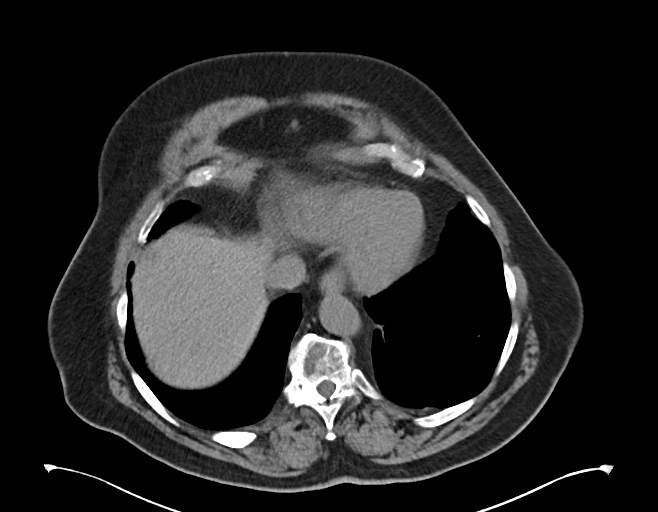
[im 51/57  soft-tissue]
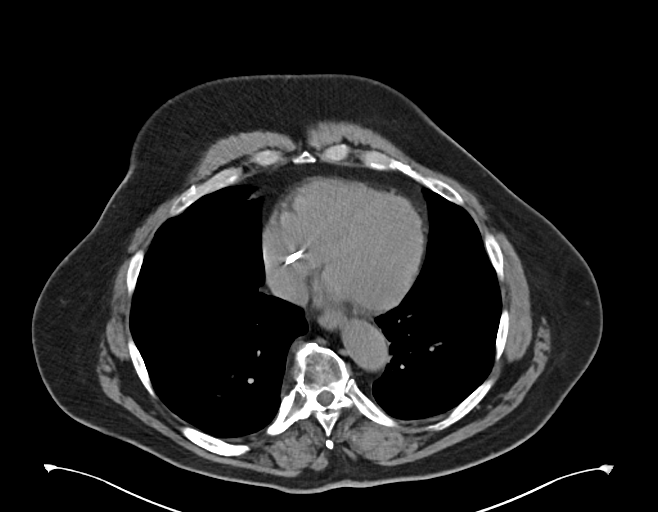
[im 51/57  bone]
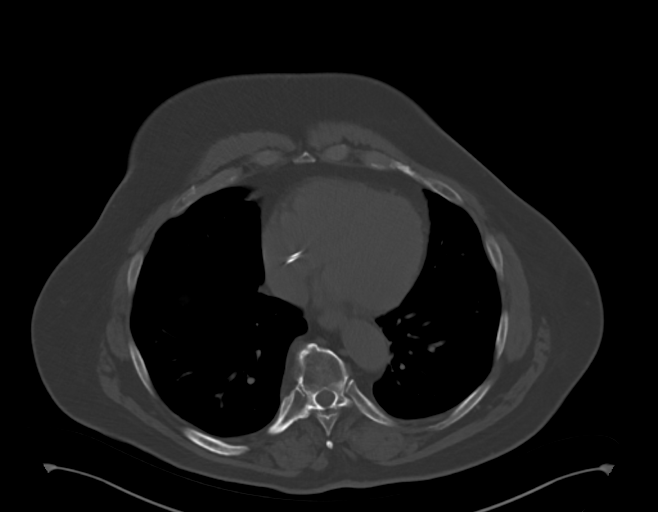

[Series 8: abd without 2.00 br40 s3 cor · coronal · non-contrast · 0.56mm/px · 3 of 229 slices shown]
[im 77/229  soft-tissue]
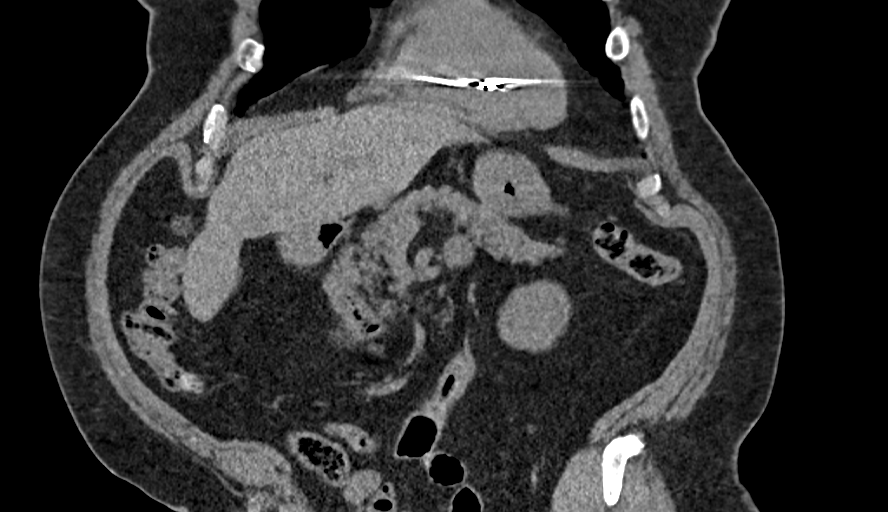
[im 102/229  soft-tissue]
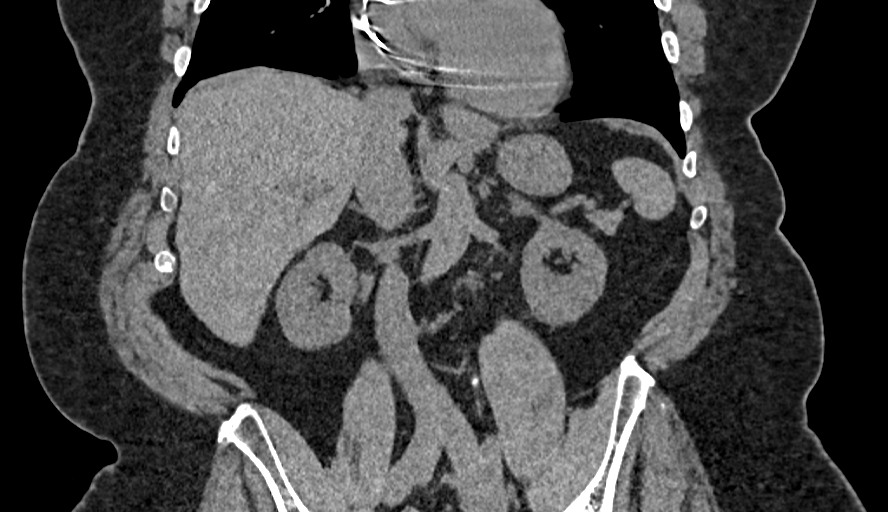
[im 127/229  soft-tissue]
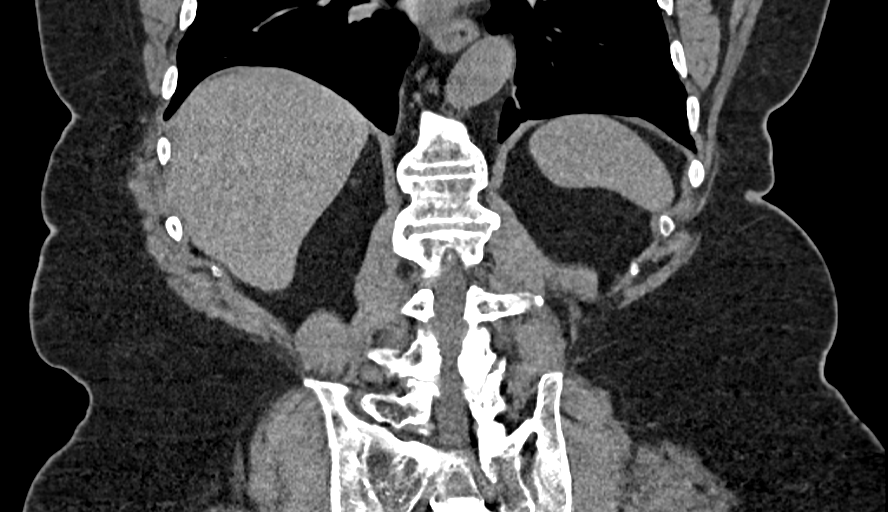

[12 of 46 positions shown; findings below may reference images not displayed]

FINDINGS: Lower chest: No acute findings.

Hepatobiliary: No masses visualized on this unenhanced exam. Prior
cholecystectomy. No abnormal fluid collection in gallbladder fossa
or perihepatic space. No evidence of biliary obstruction.

Pancreas: No mass or inflammatory process visualized on this
unenhanced exam.

Spleen:  Within normal limits in size.

Adrenals/Urinary tract: Small right renal cyst again noted. No
evidence of nephrolithiasis or hydronephrosis.

Stomach/Bowel: Colonic diverticulosis noted, however no evidence of
diverticulitis involving visualized portions.

Vascular/Lymphatic: No pathologically enlarged lymph nodes
identified. No evidence of abdominal aortic aneurysm.

Other: Stable tiny epigastric ventral abdominal wall hernia
containing only fat.

Musculoskeletal:  No suspicious bone lesions identified.
IMPRESSION: 1. No acute findings.
2. Colonic diverticulosis.
3. Stable tiny epigastric ventral abdominal wall hernia containing
only fat.

## 2020-02-21 ENCOUNTER — Other Ambulatory Visit: Payer: Self-pay

## 2020-03-09 DIAGNOSIS — Z950 Presence of cardiac pacemaker: Secondary | ICD-10-CM | POA: Diagnosis not present

## 2020-04-10 DIAGNOSIS — I480 Paroxysmal atrial fibrillation: Secondary | ICD-10-CM | POA: Diagnosis not present

## 2020-04-10 DIAGNOSIS — I10 Essential (primary) hypertension: Secondary | ICD-10-CM | POA: Diagnosis not present

## 2020-04-10 DIAGNOSIS — G4733 Obstructive sleep apnea (adult) (pediatric): Secondary | ICD-10-CM | POA: Diagnosis not present

## 2020-04-10 DIAGNOSIS — E782 Mixed hyperlipidemia: Secondary | ICD-10-CM | POA: Diagnosis not present

## 2020-04-10 DIAGNOSIS — E119 Type 2 diabetes mellitus without complications: Secondary | ICD-10-CM | POA: Diagnosis not present

## 2020-04-10 DIAGNOSIS — Z6839 Body mass index (BMI) 39.0-39.9, adult: Secondary | ICD-10-CM | POA: Diagnosis not present

## 2020-04-10 DIAGNOSIS — Z950 Presence of cardiac pacemaker: Secondary | ICD-10-CM | POA: Diagnosis not present

## 2020-06-08 DIAGNOSIS — Z125 Encounter for screening for malignant neoplasm of prostate: Secondary | ICD-10-CM | POA: Diagnosis not present

## 2020-06-08 DIAGNOSIS — R790 Abnormal level of blood mineral: Secondary | ICD-10-CM | POA: Diagnosis not present

## 2020-06-08 DIAGNOSIS — E1142 Type 2 diabetes mellitus with diabetic polyneuropathy: Secondary | ICD-10-CM | POA: Diagnosis not present

## 2020-06-08 DIAGNOSIS — M1A0720 Idiopathic chronic gout, left ankle and foot, without tophus (tophi): Secondary | ICD-10-CM | POA: Diagnosis not present

## 2020-06-08 DIAGNOSIS — Z950 Presence of cardiac pacemaker: Secondary | ICD-10-CM | POA: Diagnosis not present

## 2020-06-08 DIAGNOSIS — N1831 Chronic kidney disease, stage 3a: Secondary | ICD-10-CM | POA: Diagnosis not present

## 2020-06-09 DIAGNOSIS — I480 Paroxysmal atrial fibrillation: Secondary | ICD-10-CM | POA: Diagnosis not present

## 2020-06-09 DIAGNOSIS — Z7901 Long term (current) use of anticoagulants: Secondary | ICD-10-CM | POA: Diagnosis not present

## 2020-06-09 DIAGNOSIS — N281 Cyst of kidney, acquired: Secondary | ICD-10-CM | POA: Diagnosis not present

## 2020-06-09 DIAGNOSIS — C7951 Secondary malignant neoplasm of bone: Secondary | ICD-10-CM | POA: Diagnosis not present

## 2020-06-09 DIAGNOSIS — N189 Chronic kidney disease, unspecified: Secondary | ICD-10-CM | POA: Diagnosis not present

## 2020-06-09 DIAGNOSIS — E1122 Type 2 diabetes mellitus with diabetic chronic kidney disease: Secondary | ICD-10-CM | POA: Diagnosis not present

## 2020-06-09 DIAGNOSIS — E785 Hyperlipidemia, unspecified: Secondary | ICD-10-CM | POA: Diagnosis not present

## 2020-06-09 DIAGNOSIS — K7689 Other specified diseases of liver: Secondary | ICD-10-CM | POA: Diagnosis not present

## 2020-06-09 DIAGNOSIS — M8580 Other specified disorders of bone density and structure, unspecified site: Secondary | ICD-10-CM | POA: Diagnosis not present

## 2020-06-09 DIAGNOSIS — M1A9XX0 Chronic gout, unspecified, without tophus (tophi): Secondary | ICD-10-CM | POA: Diagnosis not present

## 2020-06-09 DIAGNOSIS — E669 Obesity, unspecified: Secondary | ICD-10-CM | POA: Diagnosis not present

## 2020-06-09 DIAGNOSIS — N1831 Chronic kidney disease, stage 3a: Secondary | ICD-10-CM | POA: Diagnosis not present

## 2020-06-09 DIAGNOSIS — D696 Thrombocytopenia, unspecified: Secondary | ICD-10-CM | POA: Diagnosis not present

## 2020-06-09 DIAGNOSIS — C9000 Multiple myeloma not having achieved remission: Secondary | ICD-10-CM | POA: Diagnosis not present

## 2020-06-09 DIAGNOSIS — I129 Hypertensive chronic kidney disease with stage 1 through stage 4 chronic kidney disease, or unspecified chronic kidney disease: Secondary | ICD-10-CM | POA: Diagnosis not present

## 2020-06-09 DIAGNOSIS — Z833 Family history of diabetes mellitus: Secondary | ICD-10-CM | POA: Diagnosis not present

## 2020-06-09 DIAGNOSIS — I459 Conduction disorder, unspecified: Secondary | ICD-10-CM | POA: Diagnosis not present

## 2020-06-09 DIAGNOSIS — I495 Sick sinus syndrome: Secondary | ICD-10-CM | POA: Diagnosis not present

## 2020-06-09 DIAGNOSIS — Z96652 Presence of left artificial knee joint: Secondary | ICD-10-CM | POA: Diagnosis not present

## 2020-06-09 DIAGNOSIS — R509 Fever, unspecified: Secondary | ICD-10-CM | POA: Diagnosis not present

## 2020-06-09 DIAGNOSIS — I493 Ventricular premature depolarization: Secondary | ICD-10-CM | POA: Diagnosis not present

## 2020-06-09 DIAGNOSIS — D72810 Lymphocytopenia: Secondary | ICD-10-CM | POA: Diagnosis not present

## 2020-06-09 DIAGNOSIS — R059 Cough, unspecified: Secondary | ICD-10-CM | POA: Diagnosis not present

## 2020-06-09 DIAGNOSIS — E860 Dehydration: Secondary | ICD-10-CM | POA: Diagnosis not present

## 2020-06-09 DIAGNOSIS — Z950 Presence of cardiac pacemaker: Secondary | ICD-10-CM | POA: Diagnosis not present

## 2020-06-09 DIAGNOSIS — D472 Monoclonal gammopathy: Secondary | ICD-10-CM | POA: Diagnosis not present

## 2020-06-09 DIAGNOSIS — K5750 Diverticulosis of both small and large intestine without perforation or abscess without bleeding: Secondary | ICD-10-CM | POA: Diagnosis not present

## 2020-06-09 DIAGNOSIS — E1140 Type 2 diabetes mellitus with diabetic neuropathy, unspecified: Secondary | ICD-10-CM | POA: Diagnosis not present

## 2020-06-09 DIAGNOSIS — R0602 Shortness of breath: Secondary | ICD-10-CM | POA: Diagnosis not present

## 2020-06-09 DIAGNOSIS — Z7984 Long term (current) use of oral hypoglycemic drugs: Secondary | ICD-10-CM | POA: Diagnosis not present

## 2020-06-09 DIAGNOSIS — I454 Nonspecific intraventricular block: Secondary | ICD-10-CM | POA: Diagnosis not present

## 2020-06-09 DIAGNOSIS — I4891 Unspecified atrial fibrillation: Secondary | ICD-10-CM | POA: Diagnosis not present

## 2020-06-09 DIAGNOSIS — Z8249 Family history of ischemic heart disease and other diseases of the circulatory system: Secondary | ICD-10-CM | POA: Diagnosis not present

## 2020-06-09 DIAGNOSIS — N183 Chronic kidney disease, stage 3 unspecified: Secondary | ICD-10-CM | POA: Diagnosis not present

## 2020-06-09 DIAGNOSIS — K219 Gastro-esophageal reflux disease without esophagitis: Secondary | ICD-10-CM | POA: Diagnosis not present

## 2020-06-09 DIAGNOSIS — N179 Acute kidney failure, unspecified: Secondary | ICD-10-CM | POA: Diagnosis not present

## 2020-06-09 DIAGNOSIS — Z825 Family history of asthma and other chronic lower respiratory diseases: Secondary | ICD-10-CM | POA: Diagnosis not present

## 2020-06-09 DIAGNOSIS — D539 Nutritional anemia, unspecified: Secondary | ICD-10-CM | POA: Diagnosis not present

## 2020-06-09 DIAGNOSIS — R062 Wheezing: Secondary | ICD-10-CM | POA: Diagnosis not present

## 2020-06-09 DIAGNOSIS — Z6839 Body mass index (BMI) 39.0-39.9, adult: Secondary | ICD-10-CM | POA: Diagnosis not present

## 2020-06-23 DIAGNOSIS — R410 Disorientation, unspecified: Secondary | ICD-10-CM | POA: Diagnosis not present

## 2020-06-23 DIAGNOSIS — K219 Gastro-esophageal reflux disease without esophagitis: Secondary | ICD-10-CM | POA: Diagnosis not present

## 2020-06-23 DIAGNOSIS — R490 Dysphonia: Secondary | ICD-10-CM | POA: Diagnosis not present

## 2020-06-23 DIAGNOSIS — I10 Essential (primary) hypertension: Secondary | ICD-10-CM | POA: Diagnosis not present

## 2020-06-23 DIAGNOSIS — C9000 Multiple myeloma not having achieved remission: Secondary | ICD-10-CM | POA: Diagnosis not present

## 2020-06-23 DIAGNOSIS — I480 Paroxysmal atrial fibrillation: Secondary | ICD-10-CM | POA: Diagnosis not present

## 2020-06-23 DIAGNOSIS — N179 Acute kidney failure, unspecified: Secondary | ICD-10-CM | POA: Diagnosis not present

## 2020-06-25 DIAGNOSIS — Z5111 Encounter for antineoplastic chemotherapy: Secondary | ICD-10-CM | POA: Diagnosis not present

## 2020-06-25 DIAGNOSIS — N189 Chronic kidney disease, unspecified: Secondary | ICD-10-CM | POA: Diagnosis not present

## 2020-06-25 DIAGNOSIS — Z79899 Other long term (current) drug therapy: Secondary | ICD-10-CM | POA: Diagnosis not present

## 2020-06-25 DIAGNOSIS — Z7984 Long term (current) use of oral hypoglycemic drugs: Secondary | ICD-10-CM | POA: Diagnosis not present

## 2020-06-25 DIAGNOSIS — E1122 Type 2 diabetes mellitus with diabetic chronic kidney disease: Secondary | ICD-10-CM | POA: Diagnosis not present

## 2020-06-25 DIAGNOSIS — C9000 Multiple myeloma not having achieved remission: Secondary | ICD-10-CM | POA: Diagnosis not present

## 2020-06-25 DIAGNOSIS — I129 Hypertensive chronic kidney disease with stage 1 through stage 4 chronic kidney disease, or unspecified chronic kidney disease: Secondary | ICD-10-CM | POA: Diagnosis not present

## 2020-06-25 DIAGNOSIS — I4891 Unspecified atrial fibrillation: Secondary | ICD-10-CM | POA: Diagnosis not present

## 2020-06-25 DIAGNOSIS — Z7901 Long term (current) use of anticoagulants: Secondary | ICD-10-CM | POA: Diagnosis not present

## 2020-06-25 DIAGNOSIS — I10 Essential (primary) hypertension: Secondary | ICD-10-CM | POA: Diagnosis not present

## 2020-06-30 DIAGNOSIS — Z20822 Contact with and (suspected) exposure to covid-19: Secondary | ICD-10-CM | POA: Diagnosis not present

## 2020-06-30 DIAGNOSIS — Z03818 Encounter for observation for suspected exposure to other biological agents ruled out: Secondary | ICD-10-CM | POA: Diagnosis not present

## 2020-07-03 DIAGNOSIS — Z79899 Other long term (current) drug therapy: Secondary | ICD-10-CM | POA: Diagnosis not present

## 2020-07-03 DIAGNOSIS — C9000 Multiple myeloma not having achieved remission: Secondary | ICD-10-CM | POA: Diagnosis not present

## 2020-07-08 DIAGNOSIS — C9000 Multiple myeloma not having achieved remission: Secondary | ICD-10-CM | POA: Diagnosis not present

## 2020-07-09 DIAGNOSIS — Z79899 Other long term (current) drug therapy: Secondary | ICD-10-CM | POA: Diagnosis not present

## 2020-07-09 DIAGNOSIS — C9000 Multiple myeloma not having achieved remission: Secondary | ICD-10-CM | POA: Diagnosis not present

## 2020-07-16 DIAGNOSIS — Z5111 Encounter for antineoplastic chemotherapy: Secondary | ICD-10-CM | POA: Diagnosis not present

## 2020-07-16 DIAGNOSIS — Z79899 Other long term (current) drug therapy: Secondary | ICD-10-CM | POA: Diagnosis not present

## 2020-07-16 DIAGNOSIS — C9000 Multiple myeloma not having achieved remission: Secondary | ICD-10-CM | POA: Diagnosis not present

## 2020-07-20 DIAGNOSIS — H00012 Hordeolum externum right lower eyelid: Secondary | ICD-10-CM | POA: Diagnosis not present

## 2020-07-20 DIAGNOSIS — L739 Follicular disorder, unspecified: Secondary | ICD-10-CM | POA: Diagnosis not present

## 2020-07-20 DIAGNOSIS — C9000 Multiple myeloma not having achieved remission: Secondary | ICD-10-CM | POA: Diagnosis not present

## 2020-07-20 DIAGNOSIS — R21 Rash and other nonspecific skin eruption: Secondary | ICD-10-CM | POA: Diagnosis not present

## 2020-07-22 DIAGNOSIS — M1A9XX0 Chronic gout, unspecified, without tophus (tophi): Secondary | ICD-10-CM | POA: Diagnosis not present

## 2020-07-22 DIAGNOSIS — S199XXA Unspecified injury of neck, initial encounter: Secondary | ICD-10-CM | POA: Diagnosis not present

## 2020-07-22 DIAGNOSIS — Z825 Family history of asthma and other chronic lower respiratory diseases: Secondary | ICD-10-CM | POA: Diagnosis not present

## 2020-07-22 DIAGNOSIS — R079 Chest pain, unspecified: Secondary | ICD-10-CM | POA: Diagnosis not present

## 2020-07-22 DIAGNOSIS — I129 Hypertensive chronic kidney disease with stage 1 through stage 4 chronic kidney disease, or unspecified chronic kidney disease: Secondary | ICD-10-CM | POA: Diagnosis not present

## 2020-07-22 DIAGNOSIS — R55 Syncope and collapse: Secondary | ICD-10-CM | POA: Diagnosis not present

## 2020-07-22 DIAGNOSIS — R509 Fever, unspecified: Secondary | ICD-10-CM | POA: Diagnosis not present

## 2020-07-22 DIAGNOSIS — I482 Chronic atrial fibrillation, unspecified: Secondary | ICD-10-CM | POA: Diagnosis not present

## 2020-07-22 DIAGNOSIS — I951 Orthostatic hypotension: Secondary | ICD-10-CM | POA: Diagnosis not present

## 2020-07-22 DIAGNOSIS — M47816 Spondylosis without myelopathy or radiculopathy, lumbar region: Secondary | ICD-10-CM | POA: Diagnosis not present

## 2020-07-22 DIAGNOSIS — Z20822 Contact with and (suspected) exposure to covid-19: Secondary | ICD-10-CM | POA: Diagnosis not present

## 2020-07-22 DIAGNOSIS — R0789 Other chest pain: Secondary | ICD-10-CM | POA: Diagnosis not present

## 2020-07-22 DIAGNOSIS — M47814 Spondylosis without myelopathy or radiculopathy, thoracic region: Secondary | ICD-10-CM | POA: Diagnosis not present

## 2020-07-22 DIAGNOSIS — S0003XA Contusion of scalp, initial encounter: Secondary | ICD-10-CM | POA: Diagnosis not present

## 2020-07-22 DIAGNOSIS — Z8249 Family history of ischemic heart disease and other diseases of the circulatory system: Secondary | ICD-10-CM | POA: Diagnosis not present

## 2020-07-22 DIAGNOSIS — I459 Conduction disorder, unspecified: Secondary | ICD-10-CM | POA: Diagnosis not present

## 2020-07-22 DIAGNOSIS — C9000 Multiple myeloma not having achieved remission: Secondary | ICD-10-CM | POA: Diagnosis not present

## 2020-07-22 DIAGNOSIS — L02431 Carbuncle of right axilla: Secondary | ICD-10-CM | POA: Diagnosis not present

## 2020-07-22 DIAGNOSIS — W19XXXA Unspecified fall, initial encounter: Secondary | ICD-10-CM | POA: Diagnosis not present

## 2020-07-22 DIAGNOSIS — I959 Hypotension, unspecified: Secondary | ICD-10-CM | POA: Diagnosis not present

## 2020-07-22 DIAGNOSIS — G3189 Other specified degenerative diseases of nervous system: Secondary | ICD-10-CM | POA: Diagnosis not present

## 2020-07-22 DIAGNOSIS — N183 Chronic kidney disease, stage 3 unspecified: Secondary | ICD-10-CM | POA: Diagnosis not present

## 2020-07-22 DIAGNOSIS — I4891 Unspecified atrial fibrillation: Secondary | ICD-10-CM | POA: Diagnosis not present

## 2020-07-22 DIAGNOSIS — K219 Gastro-esophageal reflux disease without esophagitis: Secondary | ICD-10-CM | POA: Diagnosis not present

## 2020-07-22 DIAGNOSIS — Y92012 Bathroom of single-family (private) house as the place of occurrence of the external cause: Secondary | ICD-10-CM | POA: Diagnosis not present

## 2020-07-22 DIAGNOSIS — W1839XA Other fall on same level, initial encounter: Secondary | ICD-10-CM | POA: Diagnosis not present

## 2020-07-22 DIAGNOSIS — M160 Bilateral primary osteoarthritis of hip: Secondary | ICD-10-CM | POA: Diagnosis not present

## 2020-07-22 DIAGNOSIS — S12490A Other displaced fracture of fifth cervical vertebra, initial encounter for closed fracture: Secondary | ICD-10-CM | POA: Diagnosis not present

## 2020-07-22 DIAGNOSIS — D649 Anemia, unspecified: Secondary | ICD-10-CM | POA: Diagnosis not present

## 2020-07-22 DIAGNOSIS — J9811 Atelectasis: Secondary | ICD-10-CM | POA: Diagnosis not present

## 2020-07-22 DIAGNOSIS — Z950 Presence of cardiac pacemaker: Secondary | ICD-10-CM | POA: Diagnosis not present

## 2020-07-22 DIAGNOSIS — E669 Obesity, unspecified: Secondary | ICD-10-CM | POA: Diagnosis not present

## 2020-07-22 DIAGNOSIS — G4700 Insomnia, unspecified: Secondary | ICD-10-CM | POA: Diagnosis not present

## 2020-07-22 DIAGNOSIS — L02432 Carbuncle of left axilla: Secondary | ICD-10-CM | POA: Diagnosis not present

## 2020-07-22 DIAGNOSIS — Z7984 Long term (current) use of oral hypoglycemic drugs: Secondary | ICD-10-CM | POA: Diagnosis not present

## 2020-07-22 DIAGNOSIS — I452 Bifascicular block: Secondary | ICD-10-CM | POA: Diagnosis not present

## 2020-07-22 DIAGNOSIS — E785 Hyperlipidemia, unspecified: Secondary | ICD-10-CM | POA: Diagnosis not present

## 2020-07-22 DIAGNOSIS — G9389 Other specified disorders of brain: Secondary | ICD-10-CM | POA: Diagnosis not present

## 2020-07-22 DIAGNOSIS — S12190A Other displaced fracture of second cervical vertebra, initial encounter for closed fracture: Secondary | ICD-10-CM | POA: Diagnosis not present

## 2020-07-22 DIAGNOSIS — E1122 Type 2 diabetes mellitus with diabetic chronic kidney disease: Secondary | ICD-10-CM | POA: Diagnosis not present

## 2020-07-22 DIAGNOSIS — M545 Low back pain, unspecified: Secondary | ICD-10-CM | POA: Diagnosis not present

## 2020-07-22 DIAGNOSIS — R9082 White matter disease, unspecified: Secondary | ICD-10-CM | POA: Diagnosis not present

## 2020-07-22 DIAGNOSIS — Z7901 Long term (current) use of anticoagulants: Secondary | ICD-10-CM | POA: Diagnosis not present

## 2020-07-22 DIAGNOSIS — Z6841 Body Mass Index (BMI) 40.0 and over, adult: Secondary | ICD-10-CM | POA: Diagnosis not present

## 2020-07-22 DIAGNOSIS — Z043 Encounter for examination and observation following other accident: Secondary | ICD-10-CM | POA: Diagnosis not present

## 2020-07-22 DIAGNOSIS — G319 Degenerative disease of nervous system, unspecified: Secondary | ICD-10-CM | POA: Diagnosis not present

## 2020-07-22 DIAGNOSIS — Z833 Family history of diabetes mellitus: Secondary | ICD-10-CM | POA: Diagnosis not present

## 2020-07-22 DIAGNOSIS — R071 Chest pain on breathing: Secondary | ICD-10-CM | POA: Diagnosis not present

## 2020-07-22 DIAGNOSIS — E1140 Type 2 diabetes mellitus with diabetic neuropathy, unspecified: Secondary | ICD-10-CM | POA: Diagnosis not present

## 2020-07-22 DIAGNOSIS — W1812XA Fall from or off toilet with subsequent striking against object, initial encounter: Secondary | ICD-10-CM | POA: Diagnosis not present

## 2020-07-22 DIAGNOSIS — B349 Viral infection, unspecified: Secondary | ICD-10-CM | POA: Diagnosis not present

## 2020-07-22 DIAGNOSIS — Y998 Other external cause status: Secondary | ICD-10-CM | POA: Diagnosis not present

## 2020-07-23 DIAGNOSIS — C9000 Multiple myeloma not having achieved remission: Secondary | ICD-10-CM | POA: Diagnosis not present

## 2020-07-27 DIAGNOSIS — Z950 Presence of cardiac pacemaker: Secondary | ICD-10-CM | POA: Diagnosis not present

## 2020-07-27 DIAGNOSIS — I444 Left anterior fascicular block: Secondary | ICD-10-CM | POA: Diagnosis not present

## 2020-07-31 DIAGNOSIS — L02421 Furuncle of right axilla: Secondary | ICD-10-CM | POA: Diagnosis not present

## 2020-07-31 DIAGNOSIS — Z45018 Encounter for adjustment and management of other part of cardiac pacemaker: Secondary | ICD-10-CM | POA: Diagnosis not present

## 2020-07-31 DIAGNOSIS — Z09 Encounter for follow-up examination after completed treatment for conditions other than malignant neoplasm: Secondary | ICD-10-CM | POA: Diagnosis not present

## 2020-07-31 DIAGNOSIS — E782 Mixed hyperlipidemia: Secondary | ICD-10-CM | POA: Diagnosis not present

## 2020-07-31 DIAGNOSIS — I10 Essential (primary) hypertension: Secondary | ICD-10-CM | POA: Diagnosis not present

## 2020-07-31 DIAGNOSIS — G4733 Obstructive sleep apnea (adult) (pediatric): Secondary | ICD-10-CM | POA: Diagnosis not present

## 2020-07-31 DIAGNOSIS — R52 Pain, unspecified: Secondary | ICD-10-CM | POA: Diagnosis not present

## 2020-07-31 DIAGNOSIS — R001 Bradycardia, unspecified: Secondary | ICD-10-CM | POA: Diagnosis not present

## 2020-07-31 DIAGNOSIS — R55 Syncope and collapse: Secondary | ICD-10-CM | POA: Diagnosis not present

## 2020-07-31 DIAGNOSIS — C9000 Multiple myeloma not having achieved remission: Secondary | ICD-10-CM | POA: Diagnosis not present

## 2020-07-31 DIAGNOSIS — R600 Localized edema: Secondary | ICD-10-CM | POA: Diagnosis not present

## 2020-07-31 DIAGNOSIS — E1142 Type 2 diabetes mellitus with diabetic polyneuropathy: Secondary | ICD-10-CM | POA: Diagnosis not present

## 2020-08-06 DIAGNOSIS — I10 Essential (primary) hypertension: Secondary | ICD-10-CM | POA: Diagnosis not present

## 2020-08-06 DIAGNOSIS — R0789 Other chest pain: Secondary | ICD-10-CM | POA: Diagnosis not present

## 2020-08-06 DIAGNOSIS — R55 Syncope and collapse: Secondary | ICD-10-CM | POA: Diagnosis not present

## 2020-08-06 DIAGNOSIS — I9589 Other hypotension: Secondary | ICD-10-CM | POA: Diagnosis not present

## 2020-08-06 DIAGNOSIS — S129XXA Fracture of neck, unspecified, initial encounter: Secondary | ICD-10-CM | POA: Diagnosis not present

## 2020-08-07 DIAGNOSIS — G250 Essential tremor: Secondary | ICD-10-CM | POA: Diagnosis not present

## 2020-08-13 DIAGNOSIS — I4891 Unspecified atrial fibrillation: Secondary | ICD-10-CM | POA: Diagnosis not present

## 2020-08-13 DIAGNOSIS — E119 Type 2 diabetes mellitus without complications: Secondary | ICD-10-CM | POA: Diagnosis not present

## 2020-08-13 DIAGNOSIS — Z79899 Other long term (current) drug therapy: Secondary | ICD-10-CM | POA: Diagnosis not present

## 2020-08-13 DIAGNOSIS — R55 Syncope and collapse: Secondary | ICD-10-CM | POA: Diagnosis not present

## 2020-08-13 DIAGNOSIS — Z7984 Long term (current) use of oral hypoglycemic drugs: Secondary | ICD-10-CM | POA: Diagnosis not present

## 2020-08-13 DIAGNOSIS — I495 Sick sinus syndrome: Secondary | ICD-10-CM | POA: Diagnosis not present

## 2020-08-13 DIAGNOSIS — Z7901 Long term (current) use of anticoagulants: Secondary | ICD-10-CM | POA: Diagnosis not present

## 2020-08-13 DIAGNOSIS — M8580 Other specified disorders of bone density and structure, unspecified site: Secondary | ICD-10-CM | POA: Diagnosis not present

## 2020-08-13 DIAGNOSIS — Z7952 Long term (current) use of systemic steroids: Secondary | ICD-10-CM | POA: Diagnosis not present

## 2020-08-13 DIAGNOSIS — C9000 Multiple myeloma not having achieved remission: Secondary | ICD-10-CM | POA: Diagnosis not present

## 2020-08-13 DIAGNOSIS — I10 Essential (primary) hypertension: Secondary | ICD-10-CM | POA: Diagnosis not present

## 2020-08-20 DIAGNOSIS — Z5111 Encounter for antineoplastic chemotherapy: Secondary | ICD-10-CM | POA: Diagnosis not present

## 2020-08-20 DIAGNOSIS — C9000 Multiple myeloma not having achieved remission: Secondary | ICD-10-CM | POA: Diagnosis not present

## 2020-08-21 DIAGNOSIS — Z0000 Encounter for general adult medical examination without abnormal findings: Secondary | ICD-10-CM | POA: Diagnosis not present

## 2020-08-21 DIAGNOSIS — G5712 Meralgia paresthetica, left lower limb: Secondary | ICD-10-CM | POA: Diagnosis not present

## 2020-08-21 DIAGNOSIS — E559 Vitamin D deficiency, unspecified: Secondary | ICD-10-CM | POA: Diagnosis not present

## 2020-08-21 DIAGNOSIS — G250 Essential tremor: Secondary | ICD-10-CM | POA: Diagnosis not present

## 2020-08-21 DIAGNOSIS — N1831 Chronic kidney disease, stage 3a: Secondary | ICD-10-CM | POA: Diagnosis not present

## 2020-08-21 DIAGNOSIS — E1142 Type 2 diabetes mellitus with diabetic polyneuropathy: Secondary | ICD-10-CM | POA: Diagnosis not present

## 2020-08-21 DIAGNOSIS — C9000 Multiple myeloma not having achieved remission: Secondary | ICD-10-CM | POA: Diagnosis not present

## 2020-08-21 DIAGNOSIS — K219 Gastro-esophageal reflux disease without esophagitis: Secondary | ICD-10-CM | POA: Diagnosis not present

## 2020-08-21 DIAGNOSIS — M1A0720 Idiopathic chronic gout, left ankle and foot, without tophus (tophi): Secondary | ICD-10-CM | POA: Diagnosis not present

## 2020-08-21 DIAGNOSIS — E7800 Pure hypercholesterolemia, unspecified: Secondary | ICD-10-CM | POA: Diagnosis not present

## 2020-08-21 DIAGNOSIS — R790 Abnormal level of blood mineral: Secondary | ICD-10-CM | POA: Diagnosis not present

## 2020-08-21 DIAGNOSIS — I480 Paroxysmal atrial fibrillation: Secondary | ICD-10-CM | POA: Diagnosis not present

## 2020-08-21 DIAGNOSIS — S129XXS Fracture of neck, unspecified, sequela: Secondary | ICD-10-CM | POA: Diagnosis not present

## 2020-08-25 DIAGNOSIS — I959 Hypotension, unspecified: Secondary | ICD-10-CM | POA: Diagnosis not present

## 2020-08-25 DIAGNOSIS — W19XXXA Unspecified fall, initial encounter: Secondary | ICD-10-CM | POA: Diagnosis not present

## 2020-08-25 DIAGNOSIS — I4510 Unspecified right bundle-branch block: Secondary | ICD-10-CM | POA: Diagnosis not present

## 2020-08-25 DIAGNOSIS — C9000 Multiple myeloma not having achieved remission: Secondary | ICD-10-CM | POA: Diagnosis not present

## 2020-08-25 DIAGNOSIS — I495 Sick sinus syndrome: Secondary | ICD-10-CM | POA: Diagnosis not present

## 2020-08-25 DIAGNOSIS — Z950 Presence of cardiac pacemaker: Secondary | ICD-10-CM | POA: Diagnosis not present

## 2020-08-25 DIAGNOSIS — Z6836 Body mass index (BMI) 36.0-36.9, adult: Secondary | ICD-10-CM | POA: Diagnosis not present

## 2020-08-25 DIAGNOSIS — E860 Dehydration: Secondary | ICD-10-CM | POA: Diagnosis not present

## 2020-08-25 DIAGNOSIS — E871 Hypo-osmolality and hyponatremia: Secondary | ICD-10-CM | POA: Diagnosis not present

## 2020-08-25 DIAGNOSIS — I129 Hypertensive chronic kidney disease with stage 1 through stage 4 chronic kidney disease, or unspecified chronic kidney disease: Secondary | ICD-10-CM | POA: Diagnosis not present

## 2020-08-25 DIAGNOSIS — N179 Acute kidney failure, unspecified: Secondary | ICD-10-CM | POA: Diagnosis not present

## 2020-08-25 DIAGNOSIS — D508 Other iron deficiency anemias: Secondary | ICD-10-CM | POA: Diagnosis not present

## 2020-08-25 DIAGNOSIS — R42 Dizziness and giddiness: Secondary | ICD-10-CM | POA: Diagnosis not present

## 2020-08-25 DIAGNOSIS — I452 Bifascicular block: Secondary | ICD-10-CM | POA: Diagnosis not present

## 2020-08-25 DIAGNOSIS — D630 Anemia in neoplastic disease: Secondary | ICD-10-CM | POA: Diagnosis not present

## 2020-08-25 DIAGNOSIS — E785 Hyperlipidemia, unspecified: Secondary | ICD-10-CM | POA: Diagnosis not present

## 2020-08-25 DIAGNOSIS — I4891 Unspecified atrial fibrillation: Secondary | ICD-10-CM | POA: Diagnosis not present

## 2020-08-25 DIAGNOSIS — E669 Obesity, unspecified: Secondary | ICD-10-CM | POA: Diagnosis not present

## 2020-08-25 DIAGNOSIS — D469 Myelodysplastic syndrome, unspecified: Secondary | ICD-10-CM | POA: Diagnosis not present

## 2020-08-25 DIAGNOSIS — I9589 Other hypotension: Secondary | ICD-10-CM | POA: Diagnosis not present

## 2020-08-25 DIAGNOSIS — T451X5A Adverse effect of antineoplastic and immunosuppressive drugs, initial encounter: Secondary | ICD-10-CM | POA: Diagnosis not present

## 2020-08-25 DIAGNOSIS — I459 Conduction disorder, unspecified: Secondary | ICD-10-CM | POA: Diagnosis not present

## 2020-08-25 DIAGNOSIS — I482 Chronic atrial fibrillation, unspecified: Secondary | ICD-10-CM | POA: Diagnosis not present

## 2020-08-25 DIAGNOSIS — N183 Chronic kidney disease, stage 3 unspecified: Secondary | ICD-10-CM | POA: Diagnosis not present

## 2020-08-25 DIAGNOSIS — G4489 Other headache syndrome: Secondary | ICD-10-CM | POA: Diagnosis not present

## 2020-08-25 DIAGNOSIS — E1122 Type 2 diabetes mellitus with diabetic chronic kidney disease: Secondary | ICD-10-CM | POA: Diagnosis not present

## 2020-08-25 DIAGNOSIS — R531 Weakness: Secondary | ICD-10-CM | POA: Diagnosis not present

## 2020-09-07 DIAGNOSIS — Z950 Presence of cardiac pacemaker: Secondary | ICD-10-CM | POA: Diagnosis not present

## 2020-09-09 DIAGNOSIS — K4090 Unilateral inguinal hernia, without obstruction or gangrene, not specified as recurrent: Secondary | ICD-10-CM | POA: Diagnosis not present

## 2020-09-09 DIAGNOSIS — C9000 Multiple myeloma not having achieved remission: Secondary | ICD-10-CM | POA: Diagnosis not present

## 2020-09-09 DIAGNOSIS — K5730 Diverticulosis of large intestine without perforation or abscess without bleeding: Secondary | ICD-10-CM | POA: Diagnosis not present

## 2020-09-09 DIAGNOSIS — R531 Weakness: Secondary | ICD-10-CM | POA: Diagnosis not present

## 2020-09-09 DIAGNOSIS — Z950 Presence of cardiac pacemaker: Secondary | ICD-10-CM | POA: Diagnosis not present

## 2020-09-09 DIAGNOSIS — M8580 Other specified disorders of bone density and structure, unspecified site: Secondary | ICD-10-CM | POA: Diagnosis not present

## 2020-09-09 DIAGNOSIS — R5383 Other fatigue: Secondary | ICD-10-CM | POA: Diagnosis not present

## 2020-09-10 DIAGNOSIS — S12300A Unspecified displaced fracture of fourth cervical vertebra, initial encounter for closed fracture: Secondary | ICD-10-CM | POA: Diagnosis not present

## 2020-09-10 DIAGNOSIS — S12100D Unspecified displaced fracture of second cervical vertebra, subsequent encounter for fracture with routine healing: Secondary | ICD-10-CM | POA: Diagnosis not present

## 2020-09-10 DIAGNOSIS — M4312 Spondylolisthesis, cervical region: Secondary | ICD-10-CM | POA: Diagnosis not present

## 2020-09-10 DIAGNOSIS — S12100A Unspecified displaced fracture of second cervical vertebra, initial encounter for closed fracture: Secondary | ICD-10-CM | POA: Diagnosis not present

## 2020-09-10 DIAGNOSIS — M47812 Spondylosis without myelopathy or radiculopathy, cervical region: Secondary | ICD-10-CM | POA: Diagnosis not present

## 2020-09-10 DIAGNOSIS — S12400D Unspecified displaced fracture of fifth cervical vertebra, subsequent encounter for fracture with routine healing: Secondary | ICD-10-CM | POA: Diagnosis not present

## 2020-09-10 DIAGNOSIS — S12300D Unspecified displaced fracture of fourth cervical vertebra, subsequent encounter for fracture with routine healing: Secondary | ICD-10-CM | POA: Diagnosis not present

## 2020-09-10 DIAGNOSIS — S12200D Unspecified displaced fracture of third cervical vertebra, subsequent encounter for fracture with routine healing: Secondary | ICD-10-CM | POA: Diagnosis not present

## 2020-09-10 DIAGNOSIS — S12200A Unspecified displaced fracture of third cervical vertebra, initial encounter for closed fracture: Secondary | ICD-10-CM | POA: Diagnosis not present

## 2020-09-10 DIAGNOSIS — M8588 Other specified disorders of bone density and structure, other site: Secondary | ICD-10-CM | POA: Diagnosis not present

## 2020-09-10 DIAGNOSIS — S12400A Unspecified displaced fracture of fifth cervical vertebra, initial encounter for closed fracture: Secondary | ICD-10-CM | POA: Diagnosis not present

## 2020-09-18 DIAGNOSIS — I959 Hypotension, unspecified: Secondary | ICD-10-CM | POA: Diagnosis not present

## 2020-09-18 DIAGNOSIS — R6881 Early satiety: Secondary | ICD-10-CM | POA: Diagnosis not present

## 2020-09-18 DIAGNOSIS — R600 Localized edema: Secondary | ICD-10-CM | POA: Diagnosis not present

## 2020-09-18 DIAGNOSIS — G5712 Meralgia paresthetica, left lower limb: Secondary | ICD-10-CM | POA: Diagnosis not present

## 2020-09-18 DIAGNOSIS — N1831 Chronic kidney disease, stage 3a: Secondary | ICD-10-CM | POA: Diagnosis not present

## 2020-09-21 DIAGNOSIS — C9000 Multiple myeloma not having achieved remission: Secondary | ICD-10-CM | POA: Diagnosis not present

## 2020-09-21 DIAGNOSIS — R2689 Other abnormalities of gait and mobility: Secondary | ICD-10-CM | POA: Diagnosis not present

## 2020-10-01 DIAGNOSIS — I10 Essential (primary) hypertension: Secondary | ICD-10-CM | POA: Diagnosis not present

## 2020-10-01 DIAGNOSIS — M545 Low back pain, unspecified: Secondary | ICD-10-CM | POA: Diagnosis not present

## 2020-10-01 DIAGNOSIS — Z5111 Encounter for antineoplastic chemotherapy: Secondary | ICD-10-CM | POA: Diagnosis not present

## 2020-10-01 DIAGNOSIS — I4891 Unspecified atrial fibrillation: Secondary | ICD-10-CM | POA: Diagnosis not present

## 2020-10-01 DIAGNOSIS — C9000 Multiple myeloma not having achieved remission: Secondary | ICD-10-CM | POA: Diagnosis not present

## 2020-10-01 DIAGNOSIS — Z7901 Long term (current) use of anticoagulants: Secondary | ICD-10-CM | POA: Diagnosis not present

## 2020-10-01 DIAGNOSIS — E119 Type 2 diabetes mellitus without complications: Secondary | ICD-10-CM | POA: Diagnosis not present

## 2020-10-01 DIAGNOSIS — M7989 Other specified soft tissue disorders: Secondary | ICD-10-CM | POA: Diagnosis not present

## 2020-10-01 DIAGNOSIS — I495 Sick sinus syndrome: Secondary | ICD-10-CM | POA: Diagnosis not present

## 2020-10-01 DIAGNOSIS — Z792 Long term (current) use of antibiotics: Secondary | ICD-10-CM | POA: Diagnosis not present

## 2020-10-06 DIAGNOSIS — R2242 Localized swelling, mass and lump, left lower limb: Secondary | ICD-10-CM | POA: Diagnosis not present

## 2020-10-06 DIAGNOSIS — R069 Unspecified abnormalities of breathing: Secondary | ICD-10-CM | POA: Diagnosis not present

## 2020-10-08 DIAGNOSIS — I480 Paroxysmal atrial fibrillation: Secondary | ICD-10-CM | POA: Diagnosis not present

## 2020-10-08 DIAGNOSIS — Z950 Presence of cardiac pacemaker: Secondary | ICD-10-CM | POA: Diagnosis not present

## 2020-10-08 DIAGNOSIS — I10 Essential (primary) hypertension: Secondary | ICD-10-CM | POA: Diagnosis not present

## 2020-10-08 DIAGNOSIS — G4733 Obstructive sleep apnea (adult) (pediatric): Secondary | ICD-10-CM | POA: Diagnosis not present

## 2020-10-08 DIAGNOSIS — E782 Mixed hyperlipidemia: Secondary | ICD-10-CM | POA: Diagnosis not present

## 2020-10-20 DIAGNOSIS — R2689 Other abnormalities of gait and mobility: Secondary | ICD-10-CM | POA: Diagnosis not present

## 2020-11-05 DIAGNOSIS — Z950 Presence of cardiac pacemaker: Secondary | ICD-10-CM | POA: Diagnosis not present

## 2020-11-05 DIAGNOSIS — G4733 Obstructive sleep apnea (adult) (pediatric): Secondary | ICD-10-CM | POA: Diagnosis not present

## 2020-11-05 DIAGNOSIS — I480 Paroxysmal atrial fibrillation: Secondary | ICD-10-CM | POA: Diagnosis not present

## 2020-11-05 DIAGNOSIS — J90 Pleural effusion, not elsewhere classified: Secondary | ICD-10-CM | POA: Diagnosis not present

## 2020-11-05 DIAGNOSIS — R0683 Snoring: Secondary | ICD-10-CM | POA: Diagnosis not present

## 2020-11-05 DIAGNOSIS — N1831 Chronic kidney disease, stage 3a: Secondary | ICD-10-CM | POA: Diagnosis not present

## 2020-11-23 DIAGNOSIS — C9000 Multiple myeloma not having achieved remission: Secondary | ICD-10-CM | POA: Diagnosis not present

## 2020-11-23 DIAGNOSIS — Z79899 Other long term (current) drug therapy: Secondary | ICD-10-CM | POA: Diagnosis not present

## 2020-11-23 DIAGNOSIS — M545 Low back pain, unspecified: Secondary | ICD-10-CM | POA: Diagnosis not present

## 2020-11-23 DIAGNOSIS — L299 Pruritus, unspecified: Secondary | ICD-10-CM | POA: Diagnosis not present

## 2020-12-07 DIAGNOSIS — Z45018 Encounter for adjustment and management of other part of cardiac pacemaker: Secondary | ICD-10-CM | POA: Diagnosis not present

## 2020-12-15 DIAGNOSIS — M8580 Other specified disorders of bone density and structure, unspecified site: Secondary | ICD-10-CM | POA: Diagnosis not present

## 2020-12-15 DIAGNOSIS — Z950 Presence of cardiac pacemaker: Secondary | ICD-10-CM | POA: Diagnosis not present

## 2020-12-15 DIAGNOSIS — E1140 Type 2 diabetes mellitus with diabetic neuropathy, unspecified: Secondary | ICD-10-CM | POA: Diagnosis not present

## 2020-12-15 DIAGNOSIS — I4891 Unspecified atrial fibrillation: Secondary | ICD-10-CM | POA: Diagnosis not present

## 2020-12-15 DIAGNOSIS — R5383 Other fatigue: Secondary | ICD-10-CM | POA: Diagnosis not present

## 2020-12-15 DIAGNOSIS — I480 Paroxysmal atrial fibrillation: Secondary | ICD-10-CM | POA: Diagnosis not present

## 2020-12-15 DIAGNOSIS — C9000 Multiple myeloma not having achieved remission: Secondary | ICD-10-CM | POA: Diagnosis not present

## 2020-12-15 DIAGNOSIS — I495 Sick sinus syndrome: Secondary | ICD-10-CM | POA: Diagnosis not present

## 2020-12-15 DIAGNOSIS — Z7961 Long term (current) use of immunomodulator: Secondary | ICD-10-CM | POA: Diagnosis not present

## 2020-12-15 DIAGNOSIS — Z79899 Other long term (current) drug therapy: Secondary | ICD-10-CM | POA: Diagnosis not present

## 2020-12-15 DIAGNOSIS — G4733 Obstructive sleep apnea (adult) (pediatric): Secondary | ICD-10-CM | POA: Diagnosis not present

## 2020-12-15 DIAGNOSIS — N1831 Chronic kidney disease, stage 3a: Secondary | ICD-10-CM | POA: Diagnosis not present

## 2020-12-15 DIAGNOSIS — I10 Essential (primary) hypertension: Secondary | ICD-10-CM | POA: Diagnosis not present

## 2020-12-22 DIAGNOSIS — Z5111 Encounter for antineoplastic chemotherapy: Secondary | ICD-10-CM | POA: Diagnosis not present

## 2020-12-22 DIAGNOSIS — C9000 Multiple myeloma not having achieved remission: Secondary | ICD-10-CM | POA: Diagnosis not present

## 2020-12-23 DIAGNOSIS — G4733 Obstructive sleep apnea (adult) (pediatric): Secondary | ICD-10-CM | POA: Diagnosis not present

## 2020-12-29 DIAGNOSIS — I4891 Unspecified atrial fibrillation: Secondary | ICD-10-CM | POA: Diagnosis not present

## 2020-12-29 DIAGNOSIS — G4733 Obstructive sleep apnea (adult) (pediatric): Secondary | ICD-10-CM | POA: Diagnosis not present

## 2020-12-29 DIAGNOSIS — G629 Polyneuropathy, unspecified: Secondary | ICD-10-CM | POA: Diagnosis not present

## 2020-12-29 DIAGNOSIS — I495 Sick sinus syndrome: Secondary | ICD-10-CM | POA: Diagnosis not present

## 2020-12-29 DIAGNOSIS — Z5111 Encounter for antineoplastic chemotherapy: Secondary | ICD-10-CM | POA: Diagnosis not present

## 2020-12-29 DIAGNOSIS — M8580 Other specified disorders of bone density and structure, unspecified site: Secondary | ICD-10-CM | POA: Diagnosis not present

## 2020-12-29 DIAGNOSIS — C9000 Multiple myeloma not having achieved remission: Secondary | ICD-10-CM | POA: Diagnosis not present

## 2020-12-29 DIAGNOSIS — Z7984 Long term (current) use of oral hypoglycemic drugs: Secondary | ICD-10-CM | POA: Diagnosis not present

## 2020-12-29 DIAGNOSIS — I10 Essential (primary) hypertension: Secondary | ICD-10-CM | POA: Diagnosis not present

## 2020-12-29 DIAGNOSIS — E119 Type 2 diabetes mellitus without complications: Secondary | ICD-10-CM | POA: Diagnosis not present

## 2021-01-06 DIAGNOSIS — C9000 Multiple myeloma not having achieved remission: Secondary | ICD-10-CM | POA: Diagnosis not present

## 2021-01-06 DIAGNOSIS — Z5111 Encounter for antineoplastic chemotherapy: Secondary | ICD-10-CM | POA: Diagnosis not present

## 2021-01-14 DIAGNOSIS — U071 COVID-19: Secondary | ICD-10-CM | POA: Diagnosis not present

## 2021-01-14 DIAGNOSIS — J90 Pleural effusion, not elsewhere classified: Secondary | ICD-10-CM | POA: Diagnosis not present

## 2021-01-14 DIAGNOSIS — R718 Other abnormality of red blood cells: Secondary | ICD-10-CM | POA: Diagnosis not present

## 2021-01-14 DIAGNOSIS — I10 Essential (primary) hypertension: Secondary | ICD-10-CM | POA: Diagnosis not present

## 2021-01-14 DIAGNOSIS — R600 Localized edema: Secondary | ICD-10-CM | POA: Diagnosis not present

## 2021-01-14 DIAGNOSIS — Z950 Presence of cardiac pacemaker: Secondary | ICD-10-CM | POA: Diagnosis not present

## 2021-01-14 DIAGNOSIS — R0689 Other abnormalities of breathing: Secondary | ICD-10-CM | POA: Diagnosis not present

## 2021-01-14 DIAGNOSIS — R000 Tachycardia, unspecified: Secondary | ICD-10-CM | POA: Diagnosis not present

## 2021-01-14 DIAGNOSIS — R0602 Shortness of breath: Secondary | ICD-10-CM | POA: Diagnosis not present

## 2021-01-22 DIAGNOSIS — J90 Pleural effusion, not elsewhere classified: Secondary | ICD-10-CM | POA: Diagnosis not present

## 2021-01-22 DIAGNOSIS — U071 COVID-19: Secondary | ICD-10-CM | POA: Diagnosis not present

## 2021-01-26 DIAGNOSIS — C9000 Multiple myeloma not having achieved remission: Secondary | ICD-10-CM | POA: Diagnosis not present

## 2021-01-26 DIAGNOSIS — Z09 Encounter for follow-up examination after completed treatment for conditions other than malignant neoplasm: Secondary | ICD-10-CM | POA: Diagnosis not present

## 2021-01-29 DIAGNOSIS — H2512 Age-related nuclear cataract, left eye: Secondary | ICD-10-CM | POA: Diagnosis not present

## 2021-01-29 DIAGNOSIS — H40003 Preglaucoma, unspecified, bilateral: Secondary | ICD-10-CM | POA: Diagnosis not present

## 2021-01-29 DIAGNOSIS — H33322 Round hole, left eye: Secondary | ICD-10-CM | POA: Diagnosis not present

## 2021-01-29 DIAGNOSIS — Z961 Presence of intraocular lens: Secondary | ICD-10-CM | POA: Diagnosis not present

## 2021-01-29 DIAGNOSIS — E119 Type 2 diabetes mellitus without complications: Secondary | ICD-10-CM | POA: Diagnosis not present

## 2021-01-29 DIAGNOSIS — H52203 Unspecified astigmatism, bilateral: Secondary | ICD-10-CM | POA: Diagnosis not present

## 2021-01-29 DIAGNOSIS — G4733 Obstructive sleep apnea (adult) (pediatric): Secondary | ICD-10-CM | POA: Diagnosis not present

## 2021-01-29 DIAGNOSIS — H5211 Myopia, right eye: Secondary | ICD-10-CM | POA: Diagnosis not present

## 2021-01-29 DIAGNOSIS — H524 Presbyopia: Secondary | ICD-10-CM | POA: Diagnosis not present

## 2022-05-29 ENCOUNTER — Emergency Department (HOSPITAL_BASED_OUTPATIENT_CLINIC_OR_DEPARTMENT_OTHER)
Admission: EM | Admit: 2022-05-29 | Discharge: 2022-05-29 | Disposition: A | Discharge status: Home / Self Care | Payer: HMO | Attending: Emergency Medicine | Admitting: Emergency Medicine

## 2022-05-29 ENCOUNTER — Other Ambulatory Visit: Payer: Self-pay

## 2022-05-29 ENCOUNTER — Encounter (HOSPITAL_BASED_OUTPATIENT_CLINIC_OR_DEPARTMENT_OTHER): Payer: Self-pay

## 2022-05-29 ENCOUNTER — Emergency Department (HOSPITAL_BASED_OUTPATIENT_CLINIC_OR_DEPARTMENT_OTHER): Payer: HMO

## 2022-05-29 DIAGNOSIS — J40 Bronchitis, not specified as acute or chronic: Secondary | ICD-10-CM | POA: Diagnosis not present

## 2022-05-29 DIAGNOSIS — Z7984 Long term (current) use of oral hypoglycemic drugs: Secondary | ICD-10-CM | POA: Insufficient documentation

## 2022-05-29 DIAGNOSIS — R062 Wheezing: Secondary | ICD-10-CM

## 2022-05-29 DIAGNOSIS — R051 Acute cough: Secondary | ICD-10-CM

## 2022-05-29 DIAGNOSIS — E119 Type 2 diabetes mellitus without complications: Secondary | ICD-10-CM | POA: Insufficient documentation

## 2022-05-29 DIAGNOSIS — Z7901 Long term (current) use of anticoagulants: Secondary | ICD-10-CM | POA: Insufficient documentation

## 2022-05-29 LAB — BASIC METABOLIC PANEL
Anion gap: 9 (ref 5–15)
BUN: 17 mg/dL (ref 8–23)
CO2: 25 mmol/L (ref 22–32)
Calcium: 9.6 mg/dL (ref 8.9–10.3)
Chloride: 103 mmol/L (ref 98–111)
Creatinine, Ser: 1.38 mg/dL — ABNORMAL HIGH (ref 0.61–1.24)
GFR, Estimated: 52 mL/min — ABNORMAL LOW (ref >60)
Glucose, Bld: 91 mg/dL (ref 70–99)
Potassium: 4.4 mmol/L (ref 3.5–5.1)
Sodium: 137 mmol/L (ref 135–145)

## 2022-05-29 LAB — CBC WITH DIFFERENTIAL/PLATELET
Abs Immature Granulocytes: 0.02 10*3/uL (ref 0.00–0.07)
Basophils Absolute: 0 10*3/uL (ref 0.0–0.1)
Basophils Relative: 0 %
Eosinophils Absolute: 0.1 10*3/uL (ref 0.0–0.5)
Eosinophils Relative: 1 %
HCT: 39.5 % (ref 39.0–52.0)
Hemoglobin: 13 g/dL (ref 13.0–17.0)
Immature Granulocytes: 0 %
Lymphocytes Relative: 28 %
Lymphs Abs: 1.7 10*3/uL (ref 0.7–4.0)
MCH: 32.4 pg (ref 26.0–34.0)
MCHC: 32.9 g/dL (ref 30.0–36.0)
MCV: 98.5 fL (ref 80.0–100.0)
Monocytes Absolute: 0.5 10*3/uL (ref 0.1–1.0)
Monocytes Relative: 9 %
Neutro Abs: 3.8 10*3/uL (ref 1.7–7.7)
Neutrophils Relative %: 62 %
Platelets: 141 10*3/uL — ABNORMAL LOW (ref 150–400)
RBC: 4.01 MIL/uL — ABNORMAL LOW (ref 4.22–5.81)
RDW: 13.4 % (ref 11.5–15.5)
WBC: 6.1 10*3/uL (ref 4.0–10.5)
nRBC: 0 % (ref 0.0–0.2)

## 2022-05-29 MED ORDER — ALBUTEROL SULFATE HFA 108 (90 BASE) MCG/ACT IN AERS: 2.0000 | INHALATION_SPRAY | RESPIRATORY_TRACT | Status: DC | PRN

## 2022-05-29 MED ORDER — BENZONATATE 100 MG PO CAPS: 100.0000 mg | ORAL_CAPSULE | Freq: Three times a day (TID) | ORAL | 0 refills | Status: AC

## 2022-05-29 MED ORDER — ALBUTEROL SULFATE (2.5 MG/3ML) 0.083% IN NEBU
2.5000 mg | INHALATION_SOLUTION | Freq: Once | RESPIRATORY_TRACT | Status: AC
  Administered 2022-05-29: 2.5 mg via RESPIRATORY_TRACT
  Filled 2022-05-29: qty 3

## 2022-05-29 NOTE — ED Triage Notes (Signed)
Patient here POV from Home.  Endorses Cough and Mild SOB for 3 Days. Mildly Productive. No known fevers.  NAD Noted during Triage. A&Ox4. GCS 15. Ambulatory.

## 2022-05-29 NOTE — ED Provider Notes (Signed)
[facility] EMERGENCY DEPARTMENT AT [facility] Provider Note   CSN: [id number] Arrival date & time: 05/29/22  1617     History  Chief Complaint  Patient presents with   Cough    Derek Curry is a 82 y.o. male.  Patient with history of pacemaker, obstructive sleep apnea, paroxysmal A-fib and type 2 diabetes --presents to the emergency department for 2 to 3 days of cough.  He has had associated wheezing, some burning in his chest with coughing.  No reported fevers.  He was around a family member who is also sick with similar symptoms.  He endorses mild shortness of breath but has not prevented him from doing any activities.  He was referred from outside clinic for a chest x-ray.  He has some mild on and off lower extremity edema which is unchanged.  No nausea, vomiting, diaphoresis.       Home Medications Prior to Admission medications   Medication Sig Start Date End Date Taking? Authorizing Provider  allopurinol (ZYLOPRIM) 300 MG tablet Take 300 mg by mouth daily.    [provider]  aluminum-magnesium hydroxide-simethicone (MAALOX) 200-200-20 MG/5ML SUSP Take 30 mLs by mouth 4 (four) times daily as needed (heart burn).    [provider]  apixaban (ELIQUIS) 5 MG TABS tablet Take 5 mg by mouth 2 (two) times daily.  06/03/16   [provider]  Blood Glucose Monitoring Suppl (ONE TOUCH ULTRA 2) w/Device KIT USE TO CHECK BLOOD SUGAR DAILY 05/09/18   [provider]  diltiazem (CARDIZEM CD) 240 MG 24 hr capsule Take 1 capsule by mouth daily. 04/16/18   [provider]  fluticasone (FLONASE) 50 MCG/ACT nasal spray Place 1 spray into both nostrils daily as needed for allergies or rhinitis.    [provider]  glucose blood (ACCU-CHEK GUIDE) test strip USE TO CHECK BLOOD SUGAR TWICE A DAY 03/21/17   [provider]  lisinopril-hydrochlorothiazide (PRINZIDE,ZESTORETIC) 20-25 MG tablet Take 1 tablet by mouth daily. 03/28/18    [provider]  Magnesium Oxide 250 MG TABS Take 250 mg by mouth every evening.     [provider]  meclizine (ANTIVERT) 12.5 MG tablet Take 12.5 mg by mouth 3 (three) times daily as needed for dizziness or nausea.  03/12/15   [provider]  metFORMIN (GLUCOPHAGE-XR) 500 MG 24 hr tablet Take by mouth. 08/21/19   [provider]  omeprazole (PRILOSEC) 40 MG capsule Take 1 capsule by mouth daily. 04/13/18   [provider]  ONE TOUCH ULTRA TEST test strip USE TO CHECK BLOOD SUGAR DAILY 05/09/18   [provider]  simvastatin (ZOCOR) 10 MG tablet Take 1 tablet by mouth daily. 08/21/19   [provider]  valACYclovir (VALTREX) 500 MG tablet Take 500 mg by mouth 2 (two) times daily as needed (outbreak).  03/28/18   [provider]  zolpidem (AMBIEN) 5 MG tablet Take 1 tablet by mouth at bedtime. 01/15/18   [provider]      Allergies    Benazepril, Shellfish allergy, and Vicodin [hydrocodone-acetaminophen]    Review of Systems   Review of Systems  Physical Exam Updated Vital Signs BP (!) 150/73 (BP Location: Right Arm)   Pulse 73   Temp 98.7 F (37.1 C) (Oral)   Resp 18   Ht 5\' 8"  (1.727 m)   Wt 94.3 kg   SpO2 100%   BMI 31.63 kg/m   Physical Exam Vitals and nursing note reviewed.  Constitutional:      General: He is not in acute distress.    Appearance: He is well-developed.  HENT:     Head: Normocephalic and atraumatic.     Right Ear: External ear normal.     Left Ear: External ear normal.     Nose: Nose normal.     Mouth/Throat:     Mouth: Mucous membranes are moist.  Eyes:     General:        Right eye: No discharge.        Left eye: No discharge.     Conjunctiva/sclera: Conjunctivae normal.  Cardiovascular:     Rate and Rhythm: Normal rate and regular rhythm.     Heart sounds: Normal heart sounds.  Pulmonary:     Effort: Pulmonary effort is normal.     Breath sounds: Wheezing present.      Comments: Wheezing throughout lung fields, question crackles at bases Abdominal:     Palpations: Abdomen is soft.     Tenderness: There is no abdominal tenderness.  Musculoskeletal:     Cervical back: Normal range of motion and neck supple.  Skin:    General: Skin is warm and dry.  Neurological:     Mental Status: He is alert.     ED Results / Procedures / Treatments   Labs (all labs ordered are listed, but only abnormal results are displayed) Labs Reviewed  CBC WITH DIFFERENTIAL/PLATELET - Abnormal; Notable for the following components:      Result Value   RBC 4.01 (*)    Platelets 141 (*)    All other components within normal limits  BASIC METABOLIC PANEL - Abnormal; Notable for the following components:   Creatinine, Ser 1.38 (*)    GFR, Estimated 52 (*)    All other components within normal limits    ED ECG REPORT   Date: 05/29/2022  Rate: 80  Rhythm: Paced rhythm    Radiology DG Chest Portable 1 View  Result Date: 05/29/2022 CLINICAL DATA:  Cough, congestion, and shortness of breath. EXAM: PORTABLE CHEST 1 VIEW COMPARISON:  Chest x-ray dated August 25, 2020. FINDINGS: Unchanged left chest wall pacemaker. The heart size and mediastinal contours are within normal limits. Normal pulmonary vascularity. No focal consolidation, pleural effusion, or pneumothorax. No acute osseous abnormality. IMPRESSION: No active disease. Electronically Signed   By: Obie Dredge M.D.   On: 05/29/2022 17:32    Procedures Procedures    Medications Ordered in ED Medications  albuterol (VENTOLIN HFA) 108 (90 Base) MCG/ACT inhaler 2 puff (has no administration in time range)  albuterol (PROVENTIL) (2.5 MG/3ML) 0.083% nebulizer solution 2.5 mg (2.5 mg Nebulization Given 05/29/22 1644)    ED Course/ Medical Decision Making/ A&P    Patient seen and examined. History obtained directly from patient.   Labs/EKG: Ordered CBC and BMP, EKG  Imaging: Ordered chest  x-ray.  Medications/Fluids: Ordered: Albuterol.   Most recent vital signs reviewed and are as follows: BP (!) 150/73 (BP Location: Right Arm)   Pulse 73   Temp 98.7 F (37.1 C) (Oral)   Resp 18   Ht  (1.727 m)   Wt 94.3 kg   SpO2 100%   BMI 31.63 kg/m   Initial impression: Respiratory tract infection, evaluate bronchitis versus pneumonia.  Patient does not appear to be septic.  Low concern for ACS, PE.  6:24 PM Reassessment performed. Patient appears stable.  Breathing treatment made him feel better.  On reexam, mild wheezing, much  improved from arrival.  Labs personally reviewed and interpreted including: CBC normal white blood cell count, normal hemoglobin; BMP creatinine mildly elevated but at baseline at 1.38 with a BUN of 17.  Imaging personally visualized and interpreted including: Chest x-ray, pacemaker noted, agree no signs of pneumonia.  Reviewed pertinent lab work and imaging with patient at bedside. Questions answered.   Most current vital signs reviewed and are as follows: BP (!) 150/73 (BP Location: Right Arm)   Pulse 73   Temp 98.7 F (37.1 C) (Oral)   Resp 18   Ht 5\' 8"  (1.727 m)   Wt 94.3 kg   SpO2 100%   BMI 31.63 kg/m   Plan: Patient stable for discharge to home.  No indication for antibiotics.  Patient will be given albuterol inhaler and prescription for Tessalon.  Encouraged to avoid respiratory irritants.  Encouraged return to the emergency department with worsening shortness of breath, persistent chest pain, fever, new symptoms or other concerns.  Encourage PCP follow-up for recheck in the next 2 to 3 days.                             Medical Decision Making Amount and/or Complexity of Data Reviewed Labs: ordered. Radiology: ordered.  Risk Prescription drug management.   Patient presents with cough, wheezing, recent sick contact.  No fevers.  Initial concern was for pneumonia, this is not demonstrated on chest x-ray.  White blood cell  count is normal.  Patient is not febrile.  Responded well to bronchodilators.  No hypoxia.  Will continue symptomatic treatment and outpatient follow-up as needed.        Final Clinical Impression(s) / ED Diagnoses Final diagnoses:  Bronchitis  Wheezing  Acute cough    Rx / DC Orders ED Discharge Orders          Ordered    benzonatate (TESSALON) 100 MG capsule  Every 8 hours        05/29/22 1818              Renne Crigler, PA-C 05/29/22 1834    Glyn Ade, MD 05/29/22 2242

## 2022-05-29 NOTE — Discharge Instructions (Signed)
Please read and follow all provided instructions.  Your diagnoses today include:  1. Bronchitis   2. Wheezing   3. Acute cough     Tests performed today include: Chest x-ray - does not show any pneumonia Blood cell counts and electrolytes look okay Vital signs. See below for your results today.   Medications prescribed:  Tessalon Perles - cough suppressant medication  Albuterol inhaler - medication that opens up your airway  Use inhaler as follows: 1-2 puffs with spacer every 4 hours as needed for wheezing, cough, or shortness of breath.   Take any prescribed medications only as directed.  Home care instructions:  Follow any educational materials contained in this packet.  Follow-up instructions: Please follow-up with your primary care provider in the next 3 days for further evaluation of your symptoms and a recheck if you are not feeling better.   Return instructions:  Please return to the Emergency Department if you experience worsening symptoms. Please return with worsening wheezing, shortness of breath, or difficulty breathing. Return with persistent fever above 101F.  Please return if you have any other emergent concerns.  Additional Information:  Your vital signs today were: BP (!) 150/73 (BP Location: Right Arm)   Pulse 73   Temp 98.7 F (37.1 C) (Oral)   Resp 18   Ht 5\' 8"  (1.727 m)   Wt 94.3 kg   SpO2 100%   BMI 31.63 kg/m  If your blood pressure (BP) was elevated above 135/85 this visit, please have this repeated by your doctor within one month. --------------

## 2022-05-29 NOTE — ED Notes (Signed)
RN reviewed discharge instructions with pt. Pt verbalized understanding and had no further questions. VSS upon discharge.
